# Patient Record
Sex: Female | Born: 1965 | Race: Black or African American | Hispanic: No | Marital: Single | Smoking: Current every day smoker
Health system: Southern US, Community
[De-identification: ages and names within clinical notes are randomized; demographics above are authoritative.]

## PROBLEM LIST (undated history)

## (undated) DIAGNOSIS — K5900 Constipation, unspecified: Secondary | ICD-10-CM

## (undated) DIAGNOSIS — M549 Dorsalgia, unspecified: Secondary | ICD-10-CM

## (undated) DIAGNOSIS — I10 Essential (primary) hypertension: Secondary | ICD-10-CM

## (undated) DIAGNOSIS — J45909 Unspecified asthma, uncomplicated: Secondary | ICD-10-CM

## (undated) HISTORY — DX: Dorsalgia, unspecified: M54.9

## (undated) HISTORY — PX: COLONOSCOPY: SHX174

## (undated) HISTORY — DX: Constipation, unspecified: K59.00

## (undated) HISTORY — DX: Unspecified asthma, uncomplicated: J45.909

## (undated) HISTORY — DX: Essential (primary) hypertension: I10

## (undated) HISTORY — PX: POLYPECTOMY: SHX149

---

## 1993-07-22 DIAGNOSIS — D259 Leiomyoma of uterus, unspecified: Secondary | ICD-10-CM | POA: Insufficient documentation

## 1998-02-07 ENCOUNTER — Emergency Department (HOSPITAL_COMMUNITY): Admission: EM | Admit: 1998-02-07 | Discharge: 1998-02-07 | Payer: Self-pay

## 1998-06-12 ENCOUNTER — Inpatient Hospital Stay (HOSPITAL_COMMUNITY): Admission: AD | Admit: 1998-06-12 | Discharge: 1998-06-12 | Payer: Self-pay | Admitting: Obstetrics & Gynecology

## 1998-07-25 ENCOUNTER — Inpatient Hospital Stay (HOSPITAL_COMMUNITY): Admission: AD | Admit: 1998-07-25 | Discharge: 1998-07-25 | Payer: Self-pay | Admitting: *Deleted

## 1998-11-02 ENCOUNTER — Emergency Department (HOSPITAL_COMMUNITY): Admission: EM | Admit: 1998-11-02 | Discharge: 1998-11-02 | Payer: Self-pay

## 1999-01-08 ENCOUNTER — Inpatient Hospital Stay (HOSPITAL_COMMUNITY): Admission: AD | Admit: 1999-01-08 | Discharge: 1999-01-08 | Payer: Self-pay | Admitting: Obstetrics

## 1999-04-13 ENCOUNTER — Emergency Department (HOSPITAL_COMMUNITY): Admission: EM | Admit: 1999-04-13 | Discharge: 1999-04-13 | Payer: Self-pay | Admitting: *Deleted

## 1999-09-06 ENCOUNTER — Emergency Department (HOSPITAL_COMMUNITY): Admission: EM | Admit: 1999-09-06 | Discharge: 1999-09-06 | Payer: Self-pay | Admitting: Emergency Medicine

## 2000-01-16 ENCOUNTER — Emergency Department (HOSPITAL_COMMUNITY): Admission: EM | Admit: 2000-01-16 | Discharge: 2000-01-16 | Payer: Self-pay | Admitting: Emergency Medicine

## 2000-02-14 ENCOUNTER — Encounter: Admission: RE | Admit: 2000-02-14 | Discharge: 2000-02-14 | Payer: Self-pay | Admitting: Obstetrics

## 2000-03-19 ENCOUNTER — Inpatient Hospital Stay (HOSPITAL_COMMUNITY): Admission: AD | Admit: 2000-03-19 | Discharge: 2000-03-19 | Payer: Self-pay | Admitting: Obstetrics

## 2000-03-20 ENCOUNTER — Ambulatory Visit (HOSPITAL_COMMUNITY): Admission: RE | Admit: 2000-03-20 | Discharge: 2000-03-20 | Payer: Self-pay | Admitting: *Deleted

## 2000-03-24 ENCOUNTER — Inpatient Hospital Stay (HOSPITAL_COMMUNITY): Admission: AD | Admit: 2000-03-24 | Discharge: 2000-03-24 | Payer: Self-pay | Admitting: *Deleted

## 2000-03-30 ENCOUNTER — Inpatient Hospital Stay (HOSPITAL_COMMUNITY): Admission: AD | Admit: 2000-03-30 | Discharge: 2000-03-30 | Payer: Self-pay | Admitting: Obstetrics & Gynecology

## 2000-04-08 ENCOUNTER — Inpatient Hospital Stay (HOSPITAL_COMMUNITY): Admission: AD | Admit: 2000-04-08 | Discharge: 2000-04-08 | Payer: Self-pay | Admitting: Obstetrics & Gynecology

## 2000-04-15 ENCOUNTER — Inpatient Hospital Stay (HOSPITAL_COMMUNITY): Admission: AD | Admit: 2000-04-15 | Discharge: 2000-04-15 | Payer: Self-pay | Admitting: *Deleted

## 2000-05-09 ENCOUNTER — Encounter: Payer: Self-pay | Admitting: Family Medicine

## 2000-05-09 ENCOUNTER — Emergency Department (HOSPITAL_COMMUNITY): Admission: EM | Admit: 2000-05-09 | Discharge: 2000-05-09 | Payer: Self-pay | Admitting: Emergency Medicine

## 2000-08-11 ENCOUNTER — Inpatient Hospital Stay (HOSPITAL_COMMUNITY): Admission: AD | Admit: 2000-08-11 | Discharge: 2000-08-11 | Payer: Self-pay | Admitting: *Deleted

## 2000-08-28 ENCOUNTER — Emergency Department (HOSPITAL_COMMUNITY): Admission: EM | Admit: 2000-08-28 | Discharge: 2000-08-28 | Payer: Self-pay | Admitting: Emergency Medicine

## 2000-10-09 ENCOUNTER — Encounter: Admission: RE | Admit: 2000-10-09 | Discharge: 2000-10-09 | Payer: Self-pay | Admitting: Obstetrics

## 2001-07-22 HISTORY — PX: DENTAL SURGERY: SHX609

## 2004-10-09 ENCOUNTER — Emergency Department (HOSPITAL_COMMUNITY): Admission: EM | Admit: 2004-10-09 | Discharge: 2004-10-09 | Payer: Self-pay | Admitting: Emergency Medicine

## 2004-10-11 ENCOUNTER — Ambulatory Visit: Payer: Self-pay | Admitting: Cardiovascular Disease

## 2004-10-11 ENCOUNTER — Ambulatory Visit: Payer: Self-pay

## 2005-05-07 ENCOUNTER — Emergency Department (HOSPITAL_COMMUNITY): Admission: EM | Admit: 2005-05-07 | Discharge: 2005-05-07 | Payer: Self-pay | Admitting: Emergency Medicine

## 2005-12-09 ENCOUNTER — Emergency Department (HOSPITAL_COMMUNITY): Admission: EM | Admit: 2005-12-09 | Discharge: 2005-12-09 | Payer: Self-pay | Admitting: Family Medicine

## 2007-09-09 ENCOUNTER — Encounter (INDEPENDENT_AMBULATORY_CARE_PROVIDER_SITE_OTHER): Payer: Self-pay | Admitting: Internal Medicine

## 2008-02-19 ENCOUNTER — Emergency Department (HOSPITAL_COMMUNITY): Admission: EM | Admit: 2008-02-19 | Discharge: 2008-02-19 | Payer: Self-pay | Admitting: Emergency Medicine

## 2008-10-20 ENCOUNTER — Ambulatory Visit: Payer: Self-pay | Admitting: Internal Medicine

## 2008-10-20 DIAGNOSIS — G5620 Lesion of ulnar nerve, unspecified upper limb: Secondary | ICD-10-CM | POA: Insufficient documentation

## 2008-10-20 DIAGNOSIS — G5600 Carpal tunnel syndrome, unspecified upper limb: Secondary | ICD-10-CM | POA: Insufficient documentation

## 2008-10-20 DIAGNOSIS — M76899 Other specified enthesopathies of unspecified lower limb, excluding foot: Secondary | ICD-10-CM | POA: Insufficient documentation

## 2008-10-20 DIAGNOSIS — R030 Elevated blood-pressure reading, without diagnosis of hypertension: Secondary | ICD-10-CM | POA: Insufficient documentation

## 2008-10-20 LAB — CONVERTED CEMR LAB
BUN: 5 mg/dL — ABNORMAL LOW (ref 6–23)
Chloride: 103 meq/L (ref 96–112)
Creatinine, Ser: 0.71 mg/dL (ref 0.40–1.20)
Preg, Serum: NEGATIVE

## 2008-10-24 ENCOUNTER — Ambulatory Visit: Payer: Self-pay | Admitting: Internal Medicine

## 2008-10-24 ENCOUNTER — Emergency Department (HOSPITAL_COMMUNITY): Admission: EM | Admit: 2008-10-24 | Discharge: 2008-10-24 | Payer: Self-pay | Admitting: Emergency Medicine

## 2008-10-27 ENCOUNTER — Encounter (INDEPENDENT_AMBULATORY_CARE_PROVIDER_SITE_OTHER): Payer: Self-pay | Admitting: Internal Medicine

## 2008-10-28 ENCOUNTER — Emergency Department (HOSPITAL_COMMUNITY): Admission: EM | Admit: 2008-10-28 | Discharge: 2008-10-28 | Payer: Self-pay | Admitting: Emergency Medicine

## 2008-10-31 ENCOUNTER — Ambulatory Visit: Payer: Self-pay | Admitting: *Deleted

## 2008-12-22 ENCOUNTER — Encounter (INDEPENDENT_AMBULATORY_CARE_PROVIDER_SITE_OTHER): Payer: Self-pay | Admitting: Internal Medicine

## 2009-01-16 ENCOUNTER — Ambulatory Visit: Payer: Self-pay | Admitting: Internal Medicine

## 2009-01-31 ENCOUNTER — Encounter (INDEPENDENT_AMBULATORY_CARE_PROVIDER_SITE_OTHER): Payer: Self-pay | Admitting: Internal Medicine

## 2009-01-31 ENCOUNTER — Ambulatory Visit: Payer: Self-pay | Admitting: Internal Medicine

## 2009-01-31 DIAGNOSIS — N959 Unspecified menopausal and perimenopausal disorder: Secondary | ICD-10-CM | POA: Insufficient documentation

## 2009-01-31 LAB — CONVERTED CEMR LAB
Bilirubin Urine: NEGATIVE
Chlamydia, DNA Probe: NEGATIVE
Glucose, Urine, Semiquant: NEGATIVE
KOH Prep: NEGATIVE
Protein, U semiquant: 100
Specific Gravity, Urine: 1.015
WBC Urine, dipstick: NEGATIVE
Whiff Test: NEGATIVE
pH: 7.5

## 2009-02-01 ENCOUNTER — Encounter (INDEPENDENT_AMBULATORY_CARE_PROVIDER_SITE_OTHER): Payer: Self-pay | Admitting: Internal Medicine

## 2009-02-08 ENCOUNTER — Ambulatory Visit (HOSPITAL_COMMUNITY): Admission: RE | Admit: 2009-02-08 | Discharge: 2009-02-08 | Payer: Self-pay | Admitting: Internal Medicine

## 2009-02-08 ENCOUNTER — Encounter (INDEPENDENT_AMBULATORY_CARE_PROVIDER_SITE_OTHER): Payer: Self-pay | Admitting: Internal Medicine

## 2009-02-24 ENCOUNTER — Ambulatory Visit: Payer: Self-pay | Admitting: Internal Medicine

## 2009-02-24 LAB — CONVERTED CEMR LAB
ALT: 16 units/L (ref 0–35)
Alkaline Phosphatase: 54 units/L (ref 39–117)
Basophils Absolute: 0 10*3/uL (ref 0.0–0.1)
Basophils Relative: 0 % (ref 0–1)
CO2: 22 meq/L (ref 19–32)
Cholesterol: 141 mg/dL (ref 0–200)
Creatinine, Ser: 0.79 mg/dL (ref 0.40–1.20)
Eosinophils Absolute: 0.1 10*3/uL (ref 0.0–0.7)
LDL Cholesterol: 69 mg/dL (ref 0–99)
MCHC: 31.7 g/dL (ref 30.0–36.0)
MCV: 93.3 fL (ref 78.0–100.0)
Neutro Abs: 3.9 10*3/uL (ref 1.7–7.7)
Neutrophils Relative %: 53 % (ref 43–77)
Platelets: 262 10*3/uL (ref 150–400)
RBC: 4.6 M/uL (ref 3.87–5.11)
RDW: 16.6 % — ABNORMAL HIGH (ref 11.5–15.5)
Sodium: 143 meq/L (ref 135–145)
Total Bilirubin: 0.4 mg/dL (ref 0.3–1.2)
Total CHOL/HDL Ratio: 3.3
Total Protein: 7.9 g/dL (ref 6.0–8.3)
VLDL: 29 mg/dL (ref 0–40)

## 2009-03-12 ENCOUNTER — Encounter (INDEPENDENT_AMBULATORY_CARE_PROVIDER_SITE_OTHER): Payer: Self-pay | Admitting: Internal Medicine

## 2009-05-01 ENCOUNTER — Encounter (INDEPENDENT_AMBULATORY_CARE_PROVIDER_SITE_OTHER): Payer: Self-pay | Admitting: *Deleted

## 2009-05-03 ENCOUNTER — Ambulatory Visit: Payer: Self-pay | Admitting: Internal Medicine

## 2009-05-03 LAB — CONVERTED CEMR LAB: Preg, Serum: NEGATIVE

## 2009-05-04 ENCOUNTER — Ambulatory Visit: Payer: Self-pay | Admitting: Internal Medicine

## 2009-05-16 ENCOUNTER — Ambulatory Visit: Payer: Self-pay | Admitting: Internal Medicine

## 2009-05-16 DIAGNOSIS — L0292 Furuncle, unspecified: Secondary | ICD-10-CM | POA: Insufficient documentation

## 2009-05-16 DIAGNOSIS — L0293 Carbuncle, unspecified: Secondary | ICD-10-CM

## 2009-07-27 ENCOUNTER — Ambulatory Visit: Payer: Self-pay | Admitting: Nurse Practitioner

## 2009-09-21 ENCOUNTER — Telehealth (INDEPENDENT_AMBULATORY_CARE_PROVIDER_SITE_OTHER): Payer: Self-pay | Admitting: *Deleted

## 2009-10-06 ENCOUNTER — Ambulatory Visit: Payer: Self-pay | Admitting: Internal Medicine

## 2009-10-06 DIAGNOSIS — H0019 Chalazion unspecified eye, unspecified eyelid: Secondary | ICD-10-CM | POA: Insufficient documentation

## 2009-10-18 ENCOUNTER — Ambulatory Visit: Payer: Self-pay | Admitting: Internal Medicine

## 2009-11-19 ENCOUNTER — Encounter (INDEPENDENT_AMBULATORY_CARE_PROVIDER_SITE_OTHER): Payer: Self-pay | Admitting: Internal Medicine

## 2010-01-09 ENCOUNTER — Ambulatory Visit: Payer: Self-pay | Admitting: Internal Medicine

## 2010-05-04 ENCOUNTER — Ambulatory Visit: Payer: Self-pay | Admitting: Nurse Practitioner

## 2010-05-10 ENCOUNTER — Telehealth (INDEPENDENT_AMBULATORY_CARE_PROVIDER_SITE_OTHER): Payer: Self-pay | Admitting: Internal Medicine

## 2010-05-11 ENCOUNTER — Ambulatory Visit: Payer: Self-pay | Admitting: Internal Medicine

## 2010-05-26 LAB — CONVERTED CEMR LAB: Preg, Serum: NEGATIVE

## 2010-07-18 ENCOUNTER — Telehealth (INDEPENDENT_AMBULATORY_CARE_PROVIDER_SITE_OTHER): Payer: Self-pay | Admitting: Internal Medicine

## 2010-07-22 DIAGNOSIS — I10 Essential (primary) hypertension: Secondary | ICD-10-CM

## 2010-07-22 HISTORY — DX: Essential (primary) hypertension: I10

## 2010-07-24 ENCOUNTER — Ambulatory Visit: Admit: 2010-07-24 | Payer: Self-pay | Admitting: Internal Medicine

## 2010-08-21 NOTE — Progress Notes (Signed)
Summary: next depo  Phone Note Call from Patient Call back at Home Phone [phone]   Reason for Call: Refill Medication Summary of Call: PT NEEDS TO COME AGAIN FOR HER DEPO PROVERA BUT SHE DOESN'T KNOW WHEN IS HER NEXT APPOINTMENT. PLEASE CALL HER BACK. MULBERRY MD Initial call taken by: Manon Hilding,  September 21, 2009 10:39 AM  Follow-up for Phone Call        Pt's next depo should be on March 30th. Follow-up by: Vesta Mixer CMA,  September 21, 2009 11:00 AM  Additional Follow-up for Phone Call Additional follow up Details #1::        Left a message in her voice mail.Manon Hilding  September 26, 2009 9:10 AM  I set up the appointment for the pt on March 30.Manon Hilding  October 03, 2009 4:42 PM

## 2010-08-21 NOTE — Miscellaneous (Signed)
Summary: bone density documentatilon  Clinical Lists Changes  Observations: Added new observation of BONE DENSITY: DEXA:  T score, AP Spine:  -0.9, T score neck, left femur:  -0.7, T score, neck right femur: -0.7   (02/08/2009 12:58)

## 2010-08-21 NOTE — Progress Notes (Signed)
  Phone Note Call from Patient   Summary of Call: PT NEEDS DEPO RX CALEED INTO [facility]  ASAP PT APPT IS 10.21.11 ALSO PT MAY NEED TO RESTART DEPO INJ AS SHE MISSED INJ ON 04/11/10 Initial call taken by: Michelle Nasuti,  May 10, 2010 4:34 PM  Follow-up for Phone Call        She has not had a CPP since 01/2009--her depo should not be refilled if she does not make a yearly appt.  Please make sure she has a CPP scheduled next available. Will need to come in for a serum HCG--lab visit--need to find out if she has had any unprotected sex since late on shot.   Unable to fax via emr--will need to manually fax tomorrow. Follow-up by: Julieanne Manson MD,  May 10, 2010 11:47 PM  Additional Follow-up for Phone Call Additional follow up Details #1::        pt in office today for lab work.  will schedule appt for Cpp.Also will schduled depo injection on Monday. Additional Follow-up by: Levon Hedger,  May 11, 2010 2:42 PM    Prescriptions: DEPO-PROVERA 150 MG/ML SUSP (MEDROXYPROGESTERONE ACETATE) 150 mg IM every 3 months  #1 x 0   Entered and Authorized by:   Julieanne Manson MD   Signed by:   Julieanne Manson MD on 05/10/2010   Method used:   Printed then faxed to ...       [facility] (retail)       [address] [place], [place]  [address]       Ph: [phone]       Fax: [phone]   RxID:   [id number]

## 2010-08-21 NOTE — Assessment & Plan Note (Signed)
Summary: BUMP LEFT EYE/ UNDER EYE LIDS//GK   Vital Signs:  Patient profile:   45 year old female Weight:      198 pounds BMI:     35.77 Temp:     98.3 degrees F Pulse rate:   72 / minute Pulse rhythm:   regular Resp:     18 per minute BP sitting:   128 / 86  (left arm) Cuff size:   large  Vitals Entered By: Vesta Mixer CMA (October 06, 2009 10:24 AM) CC: bump inside left lower eye lid for about 3 weeks it did itch at first, but no so much now and is a little smaller also. Is Patient Diabetic? No Pain Assessment Patient in pain? no       Does patient need assistance? Ambulation Normal   CC:  bump inside left lower eye lid for about 3 weeks it did itch at first and but no so much now and is a little smaller also.Marland Kitchen  History of Present Illness: 1.  Left lower eyelid lesion--as above.  Cannot recall if any foreign body in eye before itching and bump noted.  At one time had clear discharge from the lesion.  Has not noted any problem with skin side of lower lid--just the conunctival surface.  Vision occasionally gets hazy on that side--clears with blinking eye.  Has become smaller with time as above.  Allergies (verified): No Known Drug Allergies  Physical Exam  Eyes:  5mm indurated cyst - like lesion in nasal aspect of left lower lid--can only see on conjunctival aspect of lid--does have a 1 mm pustular head.  No inflammatory change of outer lid.  PERRL, EOMI   Impression & Recommendations:  Problem # 1:  CHALAZION, LEFT (ICD-373.2) Because of inflammation, will treat with antibiotics and have pt. follow up Discussed may have chronic cyst there.  Complete Medication List: 1)  Ibuprofen 600 Mg Tabs (Ibuprofen) .Marland Kitchen.. 1 tab by mouth at bedtime as needed leg pain 2)  Depo-provera 150 Mg/ml Susp (Medroxyprogesterone acetate) .Marland KitchenMarland KitchenMarland Kitchen 150 mg im every 3 months 3)  Doxycycline Hyclate 100 Mg Tabs (Doxycycline hyclate) .Marland Kitchen.. 1 tab by mouth two times a day for 7 days  Patient  Instructions: 1)  Follow up with Dr. Delrae Alfred in 1 month  2)  Warm pack eye 1/2 hour after taking antibiotic two times a day  Prescriptions: DOXYCYCLINE HYCLATE 100 MG TABS (DOXYCYCLINE HYCLATE) 1 tab by mouth two times a day for 7 days  #14 x 0   Entered and Authorized by:   Julieanne Manson MD   Signed by:   Julieanne Manson MD on 10/06/2009   Method used:   Electronically to        [facility] [id number]* (retail)       [address]       [place], [place]  [address]       Ph: [phone]       Fax: [phone]   RxID:   [id number]

## 2010-08-23 NOTE — Progress Notes (Signed)
Summary: Query:  Refill Depo-Provera?  Phone Note Refill Request   Refills Requested: Medication #1:  DEPO-PROVERA 150 MG/ML SUSP 150 mg IM every 3 months NEED REFILL HEALTH SERVE  Initial call taken by: Domenic Polite,  July 18, 2010 10:32 AM  Follow-up for Phone Call        Refill Depo-Provera?  Last seen 09/2009.   Follow-up by: Dutch Quint RN,  July 18, 2010 11:02 AM  Additional Follow-up for Phone Call Additional follow up Details #1::        No--she has not come in for a yearly Pap since 01/2009. Needs to get one scheduled before will refill. Please discuss with pt. that it is her responsibility to call in for a yearly pap to continue the Depo.   She actually should not have been receiving Depo for past 6 months as she did not make or keep a CPP one year from last Additional Follow-up by: Julieanne Manson MD,  July 18, 2010 1:38 PM    Additional Follow-up for Phone Call Additional follow up Details #2::    Noted.  Left message with female for pt. to return call.  Dutch Quint RN  July 18, 2010 4:26 PM  Pt. advised of provider's response - verbalized understanding and agreement.  Reminded to use other methods of BC to prevent pregnancy.  Depo appt. cancelled for 07/24/10, appt. for CPP scheduled for 10/11/10.  Dutch Quint RN  July 19, 2010 5:44 PM

## 2010-10-31 LAB — CULTURE, ROUTINE-ABSCESS

## 2010-12-07 NOTE — Consult Note (Signed)
NAMEVELMER, BROADFOOT NO.:  [id number]   MEDICAL RECORD NO.:  [id number]          PATIENT TYPE:  EMS   LOCATION:  [facility]                         FACILITY:  [facility]   PHYSICIAN:  Charlton Haws, M.D.     DATE OF BIRTH:  12-04-1965   DATE OF CONSULTATION:  10/09/2004  DATE OF DISCHARGE:                                   CONSULTATION   REASON FOR CONSULTATION:  Ms. Darrow is a 45 year old patient who is not seen  regularly by a physician.  She has had 2 months of intermittent chest pain.  The pain is somewhat atypical for angina and is not truly exertional.  She  occasionally gets a cough.  She has not had a fever.  She smokes 1/2 pack a  day for over 20 years.  There is no pleuritic component.   The pain was somewhat worse this morning and she came to the ER.  Her  initial EKG was abnormal, but there was no baseline.  She had biphasic T  waves in the anterolateral leads.  Point of care enzymes are negative.   The patient has not tried nitroglycerin for the pain.  The patient's risk  factors otherwise include positive family history.  She does not take any  routine medications.   ALLERGIES:  No known drug allergies.   PAST MEDICAL HISTORY:  She has had some uterine fibroids, but no other major  medical problems.   SOCIAL HISTORY:  She is estranged from her husband.  She does not have kids.  She has brothers and sisters in the area.  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  She is otherwise fairly sedentary.  She denies other drug or alcohol use.   PHYSICAL EXAMINATION:  VITAL SIGNS:  Blood pressure 140/80, pulse 75 and  regular.  LUNGS:  Clear.  CARDIAC:  Normal heart sounds.  ABDOMEN:  Benign.  EXTREMITIES:  No edema.   LABORATORY DATA AND X-RAY FINDINGS:  EKG as described.  Chest x-ray is  normal.   Point of care enzymes are negative.   RECOMMENDATIONS:  I think it is fine for the patient to be discharged from  the ER.  We will have her follow up in our office in  the next 1-2 days to  have a stress Cardiolite and echocardiogram.  The echocardiogram will help  rule out any type of hypertrophic cardiomyopathy given her abnormal EKG.  Her pain is atypical and I suspect this is her baseline EKG.   Will give her prescription for nitroglycerin in case she gets severe pain at  home and also have her take an aspirin a day.      PN/MEDQ  D:  10/09/2004  T:  10/09/2004  Job:  [id number]

## 2011-04-19 LAB — URINE CULTURE: Colony Count: >=100000

## 2011-04-19 LAB — POCT PREGNANCY, URINE: Preg Test, Ur: NEGATIVE

## 2011-04-19 LAB — POCT URINALYSIS DIP (DEVICE)
Nitrite: NEGATIVE
Operator id: 235561
Protein, ur: 30 — AB
pH: 5.5

## 2011-04-19 LAB — GC/CHLAMYDIA PROBE AMP, GENITAL: GC Probe Amp, Genital: NEGATIVE

## 2011-06-19 ENCOUNTER — Emergency Department (INDEPENDENT_AMBULATORY_CARE_PROVIDER_SITE_OTHER)
Admission: EM | Admit: 2011-06-19 | Discharge: 2011-06-19 | Disposition: A | Discharge status: Home / Self Care | Payer: BC Managed Care – PPO | Source: Home / Self Care | Attending: Emergency Medicine | Admitting: Emergency Medicine

## 2011-06-19 DIAGNOSIS — J40 Bronchitis, not specified as acute or chronic: Secondary | ICD-10-CM

## 2011-06-19 MED ORDER — AZITHROMYCIN 250 MG PO TABS: ORAL_TABLET | ORAL | Status: AC

## 2011-06-19 MED ORDER — ALBUTEROL SULFATE HFA 108 (90 BASE) MCG/ACT IN AERS
1.0000 | INHALATION_SPRAY | Freq: Four times a day (QID) | RESPIRATORY_TRACT | Status: DC | PRN
Start: 2011-06-19 — End: 2014-11-29

## 2011-06-19 NOTE — ED Notes (Signed)
Pt c/o cough, SOB, head and nasal congestion, rt ear pain and wheezing.  Sx since 05-10-11.  States she has been taking robitussin DM

## 2011-06-19 NOTE — ED Provider Notes (Addendum)
History     CSN: [id number] Arrival date & time: 06/19/2011  5:23 PM   First MD Initiated Contact with Patient 06/19/11 1719      Chief Complaint  Patient presents with  . Cough  . Shortness of Breath    (Consider location/radiation/quality/duration/timing/severity/associated sxs/prior treatment) HPI Comments: Ben congested and with sinus congestion and drainage" coughing more so at night and at times my chest feels tight and can't breath well"..  Patient is a 45 y.o. female presenting with cough.  Cough This is a new problem. The current episode started more than 1 week ago. The problem occurs every few minutes. The problem has not changed since onset.The cough is productive of sputum. There has been no fever. Associated symptoms include headaches, sore throat and shortness of breath. Pertinent negatives include no weight loss and no wheezing. She has tried nothing for the symptoms. She is not a smoker. Her past medical history is significant for bronchitis. Her past medical history does not include pneumonia, emphysema or asthma.    History reviewed. No pertinent past medical history.  History reviewed. No pertinent past surgical history.  No family history on file.  History  Substance Use Topics  . Smoking status: Current Everyday Smoker -- 0.5 packs/day  . Smokeless tobacco: Not on file  . Alcohol Use: Yes     occasional    OB History    Grav Para Term Preterm Abortions TAB SAB Ect Mult Living                  Review of Systems  Constitutional: Negative for weight loss.  HENT: Positive for sore throat.   Respiratory: Positive for cough and shortness of breath. Negative for wheezing.   Neurological: Positive for headaches.    Allergies  Review of patient's allergies indicates no known allergies.  Home Medications   Current Outpatient Rx  Name Route Sig Dispense Refill  . ROBITUSSIN DM PO Oral Take by mouth as needed.      . ALBUTEROL SULFATE HFA 108 (90  BASE) MCG/ACT IN AERS Inhalation Inhale 1-2 puffs into the lungs every 6 (six) hours as needed for wheezing. 1 Inhaler 0  . AZITHROMYCIN 250 MG PO TABS  Take two tablets today and starting tomorrow one tablet daily x 4 days 6 each 0    BP 167/98  Pulse 89  Temp(Src) 99.1 F (37.3 C) (Oral)  Resp 18  SpO2 95%  Physical Exam  Nursing note and vitals reviewed. Constitutional: She appears well-developed and well-nourished.  HENT:  Head: Normocephalic.  Right Ear: Tympanic membrane normal.  Left Ear: Tympanic membrane normal.  Mouth/Throat: Uvula is midline, oropharynx is clear and moist and mucous membranes are normal.  Eyes: Pupils are equal, round, and reactive to light. Right eye exhibits no discharge. Left eye exhibits no discharge.  Neck: Normal range of motion.  Cardiovascular: Normal rate.   Pulmonary/Chest: Effort normal and breath sounds normal. No respiratory distress. She has no wheezes. She has no rhonchi. She has no rales. She exhibits no tenderness.  Abdominal: Soft.  Lymphadenopathy:    She has no cervical adenopathy.    ED Course  Procedures (including critical care time)  Labs Reviewed - No data to display No results found.   1. Bronchitis       MDM   COUGH AFEBRILE X 2 WEEKS- NORMAL EXAM       Jimmie Molly, MD 06/19/11 1810  Jimmie Molly, MD 06/19/11 [phone]

## 2012-07-23 ENCOUNTER — Encounter: Payer: Self-pay | Admitting: Family Medicine

## 2012-07-23 ENCOUNTER — Ambulatory Visit: Payer: BC Managed Care – PPO | Admitting: Family Medicine

## 2013-09-24 ENCOUNTER — Ambulatory Visit (HOSPITAL_COMMUNITY)
Admission: RE | Admit: 2013-09-24 | Discharge: 2013-09-24 | Disposition: A | Discharge status: Home / Self Care | Payer: BC Managed Care – PPO | Source: Ambulatory Visit | Attending: Family Medicine | Admitting: Family Medicine

## 2013-09-24 ENCOUNTER — Other Ambulatory Visit (HOSPITAL_COMMUNITY): Payer: Self-pay | Admitting: Family Medicine

## 2013-09-24 DIAGNOSIS — R0609 Other forms of dyspnea: Secondary | ICD-10-CM | POA: Insufficient documentation

## 2013-09-24 DIAGNOSIS — R0989 Other specified symptoms and signs involving the circulatory and respiratory systems: Principal | ICD-10-CM | POA: Insufficient documentation

## 2013-09-24 DIAGNOSIS — R0600 Dyspnea, unspecified: Secondary | ICD-10-CM

## 2013-09-24 DIAGNOSIS — R52 Pain, unspecified: Secondary | ICD-10-CM

## 2014-11-29 ENCOUNTER — Encounter (HOSPITAL_COMMUNITY): Payer: Self-pay | Admitting: Emergency Medicine

## 2014-11-29 ENCOUNTER — Emergency Department (INDEPENDENT_AMBULATORY_CARE_PROVIDER_SITE_OTHER)
Admission: EM | Admit: 2014-11-29 | Discharge: 2014-11-29 | Disposition: A | Discharge status: Home / Self Care | Payer: BC Managed Care – PPO | Source: Home / Self Care | Attending: Family Medicine | Admitting: Family Medicine

## 2014-11-29 DIAGNOSIS — Z720 Tobacco use: Secondary | ICD-10-CM | POA: Diagnosis not present

## 2014-11-29 DIAGNOSIS — J9801 Acute bronchospasm: Secondary | ICD-10-CM

## 2014-11-29 DIAGNOSIS — J302 Other seasonal allergic rhinitis: Secondary | ICD-10-CM

## 2014-11-29 MED ORDER — ALBUTEROL SULFATE HFA 108 (90 BASE) MCG/ACT IN AERS: 2.0000 | INHALATION_SPRAY | RESPIRATORY_TRACT | Status: DC | PRN

## 2014-11-29 MED ORDER — TRIAMCINOLONE ACETONIDE 40 MG/ML IJ SUSP
INTRAMUSCULAR | Status: AC
  Filled 2014-11-29: qty 1

## 2014-11-29 MED ORDER — IPRATROPIUM-ALBUTEROL 0.5-2.5 (3) MG/3ML IN SOLN
RESPIRATORY_TRACT | Status: AC
  Filled 2014-11-29: qty 3

## 2014-11-29 MED ORDER — TRIAMCINOLONE ACETONIDE 40 MG/ML IJ SUSP
40.0000 mg | Freq: Once | INTRAMUSCULAR | Status: AC
  Administered 2014-11-29: 40 mg via INTRAMUSCULAR

## 2014-11-29 MED ORDER — ALBUTEROL SULFATE (2.5 MG/3ML) 0.083% IN NEBU
2.5000 mg | INHALATION_SOLUTION | Freq: Once | RESPIRATORY_TRACT | Status: AC
  Administered 2014-11-29: 2.5 mg via RESPIRATORY_TRACT

## 2014-11-29 MED ORDER — IPRATROPIUM BROMIDE 0.06 % NA SOLN: 2.0000 | Freq: Four times a day (QID) | NASAL | Status: DC

## 2014-11-29 MED ORDER — ALBUTEROL SULFATE (2.5 MG/3ML) 0.083% IN NEBU
INHALATION_SOLUTION | RESPIRATORY_TRACT | Status: AC
  Filled 2014-11-29: qty 3

## 2014-11-29 MED ORDER — PREDNISONE 20 MG PO TABS: ORAL_TABLET | ORAL | Status: DC

## 2014-11-29 MED ORDER — IPRATROPIUM-ALBUTEROL 0.5-2.5 (3) MG/3ML IN SOLN
3.0000 mL | Freq: Once | RESPIRATORY_TRACT | Status: AC
  Administered 2014-11-29: 3 mL via RESPIRATORY_TRACT

## 2014-11-29 NOTE — Discharge Instructions (Signed)
Allergic Rhinitis Flonase Nasal spray Sudafed PE for congestion Allegra or Zyrtec for drainage Allergic rhinitis is when the mucous membranes in the nose respond to allergens. Allergens are particles in the air that cause your body to have an allergic reaction. This causes you to release allergic antibodies. Through a chain of events, these eventually cause you to release histamine into the blood stream. Although meant to protect the body, it is this release of histamine that causes your discomfort, such as frequent sneezing, congestion, and an itchy, runny nose.  CAUSES  Seasonal allergic rhinitis (hay fever) is caused by pollen allergens that may come from grasses, trees, and weeds. Year-round allergic rhinitis (perennial allergic rhinitis) is caused by allergens such as house dust mites, pet dander, and mold spores.  SYMPTOMS   Nasal stuffiness (congestion).  Itchy, runny nose with sneezing and tearing of the eyes. DIAGNOSIS  Your health care provider can help you determine the allergen or allergens that trigger your symptoms. If you and your health care provider are unable to determine the allergen, skin or blood testing may be used. TREATMENT  Allergic rhinitis does not have a cure, but it can be controlled by:  Medicines and allergy shots (immunotherapy).  Avoiding the allergen. Hay fever may often be treated with antihistamines in pill or nasal spray forms. Antihistamines block the effects of histamine. There are over-the-counter medicines that may help with nasal congestion and swelling around the eyes. Check with your health care provider before taking or giving this medicine.  If avoiding the allergen or the medicine prescribed do not work, there are many new medicines your health care provider can prescribe. Stronger medicine may be used if initial measures are ineffective. Desensitizing injections can be used if medicine and avoidance does not work. Desensitization is when a patient  is given ongoing shots until the body becomes less sensitive to the allergen. Make sure you follow up with your health care provider if problems continue. HOME CARE INSTRUCTIONS It is not possible to completely avoid allergens, but you can reduce your symptoms by taking steps to limit your exposure to them. It helps to know exactly what you are allergic to so that you can avoid your specific triggers. SEEK MEDICAL CARE IF:   You have a fever.  You develop a cough that does not stop easily (persistent).  You have shortness of breath.  You start wheezing.  Symptoms interfere with normal daily activities. Document Released: 04/02/2001 Document Revised: 07/13/2013 Document Reviewed: 03/15/2013 [facility] Patient Information 2015 [place], [place]. This information is not intended to replace advice given to you by your health care provider. Make sure you discuss any questions you have with your health care provider.  Bronchospasm A bronchospasm is a spasm or tightening of the airways going into the lungs. During a bronchospasm breathing becomes more difficult because the airways get smaller. When this happens there can be coughing, a whistling sound when breathing (wheezing), and difficulty breathing. Bronchospasm is often associated with asthma, but not all patients who experience a bronchospasm have asthma. CAUSES  A bronchospasm is caused by inflammation or irritation of the airways. The inflammation or irritation may be triggered by:   Allergies (such as to animals, pollen, food, or mold). Allergens that cause bronchospasm may cause wheezing immediately after exposure or many hours later.   Infection. Viral infections are believed to be the most common cause of bronchospasm.   Exercise.   Irritants (such as pollution, cigarette smoke, strong odors, aerosol sprays, and  paint fumes).   Weather changes. Winds increase molds and pollens in the air. Rain refreshes the air by washing irritants  out. Cold air may cause inflammation.   Stress and emotional upset.  SIGNS AND SYMPTOMS   Wheezing.   Excessive nighttime coughing.   Frequent or severe coughing with a simple cold.   Chest tightness.   Shortness of breath.  DIAGNOSIS  Bronchospasm is usually diagnosed through a history and physical exam. Tests, such as chest X-rays, are sometimes done to look for other conditions. TREATMENT   Inhaled medicines can be given to open up your airways and help you breathe. The medicines can be given using either an inhaler or a nebulizer machine.  Corticosteroid medicines may be given for severe bronchospasm, usually when it is associated with asthma. HOME CARE INSTRUCTIONS   Always have a plan prepared for seeking medical care. Know when to call your health care provider and local emergency services (911 in the [place] Know where you can access local emergency care.  Only take medicines as directed by your health care provider.  If you were prescribed an inhaler or nebulizer machine, ask your health care provider to explain how to use it correctly. Always use a spacer with your inhaler if you were given one.  It is necessary to remain calm during an attack. Try to relax and breathe more slowly.  Control your home environment in the following ways:   Change your heating and air conditioning filter at least once a month.   Limit your use of fireplaces and wood stoves.  Do not smoke and do not allow smoking in your home.   Avoid exposure to perfumes and fragrances.   Get rid of pests (such as roaches and mice) and their droppings.   Throw away plants if you see mold on them.   Keep your house clean and dust free.   Replace carpet with wood, tile, or vinyl flooring. Carpet can trap dander and dust.   Use allergy-proof pillows, mattress covers, and box spring covers.   Wash bed sheets and blankets every week in hot water and dry them in a dryer.   Use  blankets that are made of polyester or cotton.   Wash hands frequently. SEEK MEDICAL CARE IF:   You have muscle aches.   You have chest pain.   The sputum changes from clear or white to yellow, green, gray, or bloody.   The sputum you cough up gets thicker.   There are problems that may be related to the medicine you are given, such as a rash, itching, swelling, or trouble breathing.  SEEK IMMEDIATE MEDICAL CARE IF:   You have worsening wheezing and coughing even after taking your prescribed medicines.   You have increased difficulty breathing.   You develop severe chest pain. MAKE SURE YOU:   Understand these instructions.  Will watch your condition.  Will get help right away if you are not doing well or get worse. Document Released: 07/11/2003 Document Revised: 07/13/2013 Document Reviewed: 12/28/2012 [facility] Patient Information 2015 [place], [place]. This information is not intended to replace advice given to you by your health care provider. Make sure you discuss any questions you have with your health care provider.  How to Use an Inhaler Using your inhaler correctly is very important. Good technique will make sure that the medicine reaches your lungs.  HOW TO USE AN INHALER:  Take the cap off the inhaler.  If this is the first time  using your inhaler, you need to prime it. Shake the inhaler for 5 seconds. Release four puffs into the air, away from your face. Ask your doctor for help if you have questions.  Shake the inhaler for 5 seconds.  Turn the inhaler so the bottle is above the mouthpiece.  Put your pointer finger on top of the bottle. Your thumb holds the bottom of the inhaler.  Open your mouth.  Either hold the inhaler away from your mouth (the width of 2 fingers) or place your lips tightly around the mouthpiece. Ask your doctor which way to use your inhaler.  Breathe out as much air as possible.  Breathe in and push down on the bottle 1 time to  release the medicine. You will feel the medicine go in your mouth and throat.  Continue to take a deep breath in very slowly. Try to fill your lungs.  After you have breathed in completely, hold your breath for 10 seconds. This will help the medicine to settle in your lungs. If you cannot hold your breath for 10 seconds, hold it for as long as you can before you breathe out.  Breathe out slowly, through pursed lips. Whistling is an example of pursed lips.  If your doctor has told you to take more than 1 puff, wait at least 15-30 seconds between puffs. This will help you get the best results from your medicine. Do not use the inhaler more than your doctor tells you to.  Put the cap back on the inhaler.  Follow the directions from your doctor or from the inhaler package about cleaning the inhaler. If you use more than one inhaler, ask your doctor which inhalers to use and what order to use them in. Ask your doctor to help you figure out when you will need to refill your inhaler.  If you use a steroid inhaler, always rinse your mouth with water after your last puff, gargle and spit out the water. Do not swallow the water. GET HELP IF:  The inhaler medicine only partially helps to stop wheezing or shortness of breath.  You are having trouble using your inhaler.  You have some increase in thick spit (phlegm). GET HELP RIGHT AWAY IF:  The inhaler medicine does not help your wheezing or shortness of breath or you have tightness in your chest.  You have dizziness, headaches, or fast heart rate.  You have chills, fever, or night sweats.  You have a large increase of thick spit, or your thick spit is bloody. MAKE SURE YOU:   Understand these instructions.  Will watch your condition.  Will get help right away if you are not doing well or get worse. Document Released: 04/16/2008 Document Revised: 04/28/2013 Document Reviewed: 02/04/2013 [facility] Patient Information 2015 [place], [place].  This information is not intended to replace advice given to you by your health care provider. Make sure you discuss any questions you have with your health care provider.  Smoking Hazards Smoking cigarettes is extremely bad for your health. Tobacco smoke has over 200 known poisons in it. It contains the poisonous gases nitrogen oxide and carbon monoxide. There are over 60 chemicals in tobacco smoke that cause cancer. Some of the chemicals found in cigarette smoke include:   Cyanide.   Benzene.   Formaldehyde.   Methanol (wood alcohol).   Acetylene (fuel used in welding torches).   Ammonia.  Even smoking lightly shortens your life expectancy by several years. You can greatly reduce the risk  of medical problems for you and your family by stopping now. Smoking is the most preventable cause of death and disease in our society. Within days of quitting smoking, your circulation improves, you decrease the risk of having a heart attack, and your lung capacity improves. There may be some increased phlegm in the first few days after quitting, and it may take months for your lungs to clear up completely. Quitting for 10 years reduces your risk of developing lung cancer to almost that of a nonsmoker.  WHAT ARE THE RISKS OF SMOKING? Cigarette smokers have an increased risk of many serious medical problems, including:  Lung cancer.   Lung disease (such as pneumonia, bronchitis, and emphysema).   Heart attack and chest pain due to the heart not getting enough oxygen (angina).   Heart disease and peripheral blood vessel disease.   Hypertension.   Stroke.   Oral cancer (cancer of the lip, mouth, or voice box).   Bladder cancer.   Pancreatic cancer.   Cervical cancer.   Pregnancy complications, including premature birth.   Stillbirths and smaller newborn babies, birth defects, and genetic damage to sperm.   Early menopause.   Lower estrogen level for women.    Infertility.   Facial wrinkles.   Blindness.   Increased risk of broken bones (fractures).   Senile dementia.   Stomach ulcers and internal bleeding.   Delayed wound healing and increased risk of complications during surgery. Because of secondhand smoke exposure, children of smokers have an increased risk of the following:   Sudden infant death syndrome (SIDS).   Respiratory infections.   Lung cancer.   Heart disease.   Ear infections.  WHY IS SMOKING ADDICTIVE? Nicotine is the chemical agent in tobacco that is capable of causing addiction or dependence. When you smoke and inhale, nicotine is absorbed rapidly into the bloodstream through your lungs. Both inhaled and noninhaled nicotine may be addictive.  WHAT ARE THE BENEFITS OF QUITTING?  There are many health benefits to quitting smoking. Some are:   The likelihood of developing cancer and heart disease decreases. Health improvements are seen almost immediately.   Blood pressure, pulse rate, and breathing patterns start returning to normal soon after quitting.   People who quit may see an improvement in their overall quality of life.  HOW DO YOU QUIT SMOKING? Smoking is an addiction with both physical and psychological effects, and longtime habits can be hard to change. Your health care provider can recommend:  Programs and community resources, which may include group support, education, or therapy.  Replacement products, such as patches, gum, and nasal sprays. Use these products only as directed. Do not replace cigarette smoking with electronic cigarettes (commonly called e-cigarettes). The safety of e-cigarettes is unknown, and some may contain harmful chemicals. FOR MORE INFORMATION  American Lung Association: www.lung.org  American Cancer Society: www.cancer.org Document Released: 08/15/2004 Document Revised: 04/28/2013 Document Reviewed: 12/28/2012 [facility] Patient Information 2015 [place],  [place]. This information is not intended to replace advice given to you by your health care provider. Make sure you discuss any questions you have with your health care provider.

## 2014-11-29 NOTE — ED Provider Notes (Signed)
CSN: [id number]     Arrival date & time 11/29/14  1248 History   First MD Initiated Contact with Patient 11/29/14 1407     Chief Complaint  Patient presents with  . URI   (Consider location/radiation/quality/duration/timing/severity/associated sxs/prior Treatment) HPI Comments: 49 year old female complaining of persistent cough, nasal congestion, PND, shortness of breath, wheezing. This began approximately one month ago. She has been taking various medications including tests and MDM and a cough and cold medicine over-the-counter. She has a history of smoking for several years and states she smokes less than one pack per day now.   Past Medical History  Diagnosis Date  . HTN (hypertension) 2012   History reviewed. No pertinent past surgical history. Family History  Problem Relation Age of Onset  . Cancer Father   . Heart failure Mother   . Diabetes Mother   . Hypertension Mother   . Kidney disease Mother   . Hypertension Father    History  Substance Use Topics  . Smoking status: Current Every Day Smoker -- 0.50 packs/day  . Smokeless tobacco: Not on file  . Alcohol Use: Yes     Comment: occasional   OB History    No data available     Review of Systems  Constitutional: Positive for activity change. Negative for fever, chills, appetite change and fatigue.  HENT: Positive for congestion, postnasal drip, rhinorrhea and sinus pressure. Negative for ear pain, facial swelling, sore throat and trouble swallowing.   Eyes: Negative.   Respiratory: Positive for cough, shortness of breath and wheezing.   Cardiovascular: Negative.   Gastrointestinal: Negative.   Musculoskeletal: Negative.  Negative for neck pain and neck stiffness.  Skin: Negative for pallor and rash.  Neurological: Negative.     Allergies  Review of patient's allergies indicates no known allergies.  Home Medications   Prior to Admission medications   Medication Sig Start Date End Date Taking? Authorizing  Provider  albuterol (PROVENTIL HFA;VENTOLIN HFA) 108 (90 BASE) MCG/ACT inhaler Inhale 2 puffs into the lungs every 4 (four) hours as needed for wheezing or shortness of breath. 11/29/14   Janne Napoleon, NP  Dextromethorphan-Guaifenesin (ROBITUSSIN DM PO) Take by mouth as needed.      Historical Provider, MD  ipratropium (ATROVENT) 0.06 % nasal spray Place 2 sprays into both nostrils 4 (four) times daily. 11/29/14   Janne Napoleon, NP  predniSONE (DELTASONE) 20 MG tablet Take 3 tabs po on first day, 2 tabs second day, 2 tabs third day, 1 tab fourth day, 1 tab 5th day. Take with food. 11/29/14   Janne Napoleon, NP   BP 134/94 mmHg  Pulse 90  Temp(Src) 98.8 F (37.1 C) (Oral)  Resp 16  SpO2 96% Physical Exam  Constitutional: She is oriented to person, place, and time. She appears well-developed and well-nourished. No distress.  HENT:  Mouth/Throat: No oropharyngeal exudate.  Bilateral TMs are normal Oropharynx minor erythema with moderate amount of clear PND. Surface of the tongue is discolored due to smoking.  Eyes: EOM are normal.  Minor bilateral conjunctival erythema.  Neck: Normal range of motion. Neck supple.  Cardiovascular: Normal rate, regular rhythm, normal heart sounds and intact distal pulses.   Pulmonary/Chest: Effort normal. She has wheezes. She has no rales.  Prolonged expiratory phase  Musculoskeletal: Normal range of motion.  Lymphadenopathy:    She has no cervical adenopathy.  Neurological: She is alert and oriented to person, place, and time. She exhibits normal muscle tone.  Skin: Skin is warm  and dry.  Nursing note and vitals reviewed.   ED Course  Procedures (including critical care time) Labs Review Labs Reviewed - No data to display  Imaging Review No results found.   MDM   1. Other seasonal allergic rhinitis   2. Bronchospasm   3. Tobacco abuse disorder    Post DuoNeb 5 mg/2.5 mg patient states she is breathing better. Lung auscultation reveals much improved air  movement, significant decrease in wheezing and improved expiratory phase shortening. Kenalog 40 mg IM Flonase nasal spray as directed Atrovent nasal spray Prednisone taper dose Tylenol as needed Follow-up with your PCP. [place]    Janne Napoleon, NP 11/29/14 1510

## 2014-11-29 NOTE — ED Notes (Signed)
Patient c/o chest congestion and nasal congestion with SOB x 1 month. Denies fever but has had chills. Patient is in NAD.

## 2015-08-20 ENCOUNTER — Emergency Department (HOSPITAL_COMMUNITY)
Admission: EM | Admit: 2015-08-20 | Discharge: 2015-08-20 | Disposition: A | Discharge status: Home / Self Care | Payer: BC Managed Care – PPO | Attending: Emergency Medicine | Admitting: Emergency Medicine

## 2015-08-20 ENCOUNTER — Emergency Department (HOSPITAL_COMMUNITY): Payer: BC Managed Care – PPO

## 2015-08-20 ENCOUNTER — Encounter (HOSPITAL_COMMUNITY): Payer: Self-pay | Admitting: Emergency Medicine

## 2015-08-20 DIAGNOSIS — E860 Dehydration: Secondary | ICD-10-CM | POA: Diagnosis not present

## 2015-08-20 DIAGNOSIS — F10120 Alcohol abuse with intoxication, uncomplicated: Secondary | ICD-10-CM | POA: Diagnosis not present

## 2015-08-20 DIAGNOSIS — R42 Dizziness and giddiness: Secondary | ICD-10-CM | POA: Diagnosis present

## 2015-08-20 DIAGNOSIS — F17200 Nicotine dependence, unspecified, uncomplicated: Secondary | ICD-10-CM | POA: Insufficient documentation

## 2015-08-20 DIAGNOSIS — I10 Essential (primary) hypertension: Secondary | ICD-10-CM | POA: Insufficient documentation

## 2015-08-20 DIAGNOSIS — F10920 Alcohol use, unspecified with intoxication, uncomplicated: Secondary | ICD-10-CM

## 2015-08-20 LAB — CBC WITH DIFFERENTIAL/PLATELET
BASOS ABS: 0 10*3/uL (ref 0.0–0.1)
BASOS PCT: 0 %
EOS ABS: 0.1 10*3/uL (ref 0.0–0.7)
Eosinophils Relative: 1 %
HCT: 38.9 % (ref 36.0–46.0)
HEMOGLOBIN: 12.4 g/dL (ref 12.0–15.0)
Lymphocytes Relative: 37 %
Lymphs Abs: 2.5 10*3/uL (ref 0.7–4.0)
MCH: 26.6 pg (ref 26.0–34.0)
MCHC: 31.9 g/dL (ref 30.0–36.0)
MCV: 83.5 fL (ref 78.0–100.0)
MONOS PCT: 6 %
Monocytes Absolute: 0.4 10*3/uL (ref 0.1–1.0)
NEUTROS PCT: 56 %
Neutro Abs: 3.8 10*3/uL (ref 1.7–7.7)
Platelets: 258 10*3/uL (ref 150–400)
RBC: 4.66 MIL/uL (ref 3.87–5.11)
RDW: 18.3 % — AB (ref 11.5–15.5)
WBC: 6.7 10*3/uL (ref 4.0–10.5)

## 2015-08-20 LAB — COMPREHENSIVE METABOLIC PANEL
ALBUMIN: 3.4 g/dL — AB (ref 3.5–5.0)
ALK PHOS: 63 U/L (ref 38–126)
ALT: 20 U/L (ref 14–54)
ANION GAP: 12 (ref 5–15)
AST: 25 U/L (ref 15–41)
BUN: 7 mg/dL (ref 6–20)
CO2: 18 mmol/L — AB (ref 22–32)
Calcium: 8.3 mg/dL — ABNORMAL LOW (ref 8.9–10.3)
Chloride: 109 mmol/L (ref 101–111)
Creatinine, Ser: 0.93 mg/dL (ref 0.44–1.00)
GFR calc Af Amer: >60 mL/min (ref >60)
GFR calc non Af Amer: >60 mL/min (ref >60)
GLUCOSE: 108 mg/dL — AB (ref 65–99)
POTASSIUM: 3.5 mmol/L (ref 3.5–5.1)
SODIUM: 139 mmol/L (ref 135–145)
Total Bilirubin: 0.2 mg/dL — ABNORMAL LOW (ref 0.3–1.2)
Total Protein: 7.5 g/dL (ref 6.5–8.1)

## 2015-08-20 LAB — I-STAT CG4 LACTIC ACID, ED
LACTIC ACID, VENOUS: 2.2 mmol/L — AB (ref 0.5–2.0)
Lactic Acid, Venous: 2.71 mmol/L (ref 0.5–2.0)

## 2015-08-20 LAB — LIPASE, BLOOD: Lipase: 38 U/L (ref 11–51)

## 2015-08-20 LAB — I-STAT TROPONIN, ED: Troponin i, poc: 0 ng/mL (ref 0.00–0.08)

## 2015-08-20 LAB — ETHANOL: ALCOHOL ETHYL (B): 93 mg/dL — AB (ref <5)

## 2015-08-20 MED ORDER — M.V.I. ADULT IV INJ
INJECTION | Freq: Once | INTRAVENOUS | Status: AC
  Administered 2015-08-20: 02:00:00 via INTRAVENOUS
  Filled 2015-08-20: qty 1000

## 2015-08-20 MED ORDER — SODIUM CHLORIDE 0.9 % IV BOLUS (SEPSIS)
1000.0000 mL | Freq: Once | INTRAVENOUS | Status: AC
  Administered 2015-08-20: 1000 mL via INTRAVENOUS

## 2015-08-20 NOTE — ED Notes (Signed)
Pt coming from home with orthostatic changes. Pt has been drinking alcohol since Friday (gin and OJ). N/V x1 in the last 24 hrs. Pt has been dizzy. No diarrhea. 20LH 4 of zofran by EMS.   Manual BP by EMS 80/44   118/84 95-98% RA.  124 CBG. 20R  Per EMS elevation in V1 and V2 but on opposite side of QRS.

## 2015-08-20 NOTE — ED Notes (Signed)
Pt ambulated by EDT and this RN. Pt tolerated ambulation well, was able to ambulate without assistance.

## 2015-08-20 NOTE — Discharge Instructions (Signed)
Alcohol Intoxication  Alcohol intoxication occurs when the amount of alcohol that a person has consumed impairs his or her ability to mentally and physically function. Alcohol directly impairs the normal chemical activity of the brain. Drinking large amounts of alcohol can lead to changes in mental function and behavior, and it can cause many physical effects that can be harmful.   Alcohol intoxication can range in severity from mild to very severe. Various factors can affect the level of intoxication that occurs, such as the person's age, gender, weight, frequency of alcohol consumption, and the presence of other medical conditions (such as diabetes, seizures, or heart conditions). Dangerous levels of alcohol intoxication may occur when people drink large amounts of alcohol in a short period (binge drinking). Alcohol can also be especially dangerous when combined with certain prescription medicines or "recreational" drugs.  SIGNS AND SYMPTOMS  Some common signs and symptoms of mild alcohol intoxication include:  · Loss of coordination.  · Changes in mood and behavior.  · Impaired judgment.  · Slurred speech.  As alcohol intoxication progresses to more severe levels, other signs and symptoms will appear. These may include:  · Vomiting.  · Confusion and impaired memory.  · Slowed breathing.  · Seizures.  · Loss of consciousness.  DIAGNOSIS   Your health care provider will take a medical history and perform a physical exam. You will be asked about the amount and type of alcohol you have consumed. Blood tests will be done to measure the concentration of alcohol in your blood. In many places, your blood alcohol level must be lower than 80 mg/dL (0.08%) to legally drive. However, many dangerous effects of alcohol can occur at much lower levels.   TREATMENT   People with alcohol intoxication often do not require treatment. Most of the effects of alcohol intoxication are temporary, and they go away as the alcohol naturally  leaves the body. Your health care provider will monitor your condition until you are stable enough to go home. Fluids are sometimes given through an IV access tube to help prevent dehydration.   HOME CARE INSTRUCTIONS  · Do not drive after drinking alcohol.  · Stay hydrated. Drink enough water and fluids to keep your urine clear or pale yellow. Avoid caffeine.    · Only take over-the-counter or prescription medicines as directed by your health care provider.    SEEK MEDICAL CARE IF:   · You have persistent vomiting.    · You do not feel better after a few days.  · You have frequent alcohol intoxication. Your health care provider can help determine if you should see a substance use treatment counselor.  SEEK IMMEDIATE MEDICAL CARE IF:   · You become shaky or tremble when you try to stop drinking.    · You shake uncontrollably (seizure).    · You throw up (vomit) blood. This may be bright red or may look like black coffee grounds.    · You have blood in your stool. This may be bright red or may appear as a black, tarry, bad smelling stool.    · You become lightheaded or faint.    MAKE SURE YOU:   · Understand these instructions.  · Will watch your condition.  · Will get help right away if you are not doing well or get worse.     This information is not intended to replace advice given to you by your health care provider. Make sure you discuss any questions you have with your health care provider.       Document Released: 04/17/2005 Document Revised: 03/10/2013 Document Reviewed: 12/11/2012  [employer] Interactive Patient Education ©2016 [employer]

## 2015-08-20 NOTE — ED Provider Notes (Signed)
CSN: [id number]     Arrival date & time    History  By signing my name below, I, Erling Conte, attest that this documentation has been prepared under the direction and in the presence of No att. providers found. Electronically Signed: Erling Conte, ED Scribe. 08/20/2015. 10:47 PM.   Chief Complaint  Patient presents with  . Hypotension   The history is provided by the patient and the EMS personnel. No language interpreter was used.    HPI Comments: Carolyn Espinoza is a 50 y.o. female brought in by EMS with a h/o HTN who presents to the Emergency Department complaining of alcohol intoxication.. She reports associated dizziness, nausea,  and vomiting x1. Pt states her brother called EMS and she is "not sure why". Pt notes she has been drinking a large amount of alcohol (gin and OJ) over the past 24 hours. Pt denies any h/o alcohol abuse. Per EMS pt had orthostatic changes with a manual BP of 80/44, however, upon arrival to ER pt's BP was 113/65. She is a current everyday smoker with a 0.5 ppd history. She denies any abdominal pain, CP, SOB, fevers, or other associated symptoms at this time.  Past Medical History  Diagnosis Date  . HTN (hypertension) 2012   History reviewed. No pertinent past surgical history. Family History  Problem Relation Age of Onset  . Cancer Father   . Heart failure Mother   . Diabetes Mother   . Hypertension Mother   . Kidney disease Mother   . Hypertension Father    Social History  Substance Use Topics  . Smoking status: Current Every Day Smoker -- 0.50 packs/day  . Smokeless tobacco: None  . Alcohol Use: Yes     Comment: occasional   OB History    No data available     Review of Systems  Constitutional: Negative for fever.  Respiratory: Negative for shortness of breath.   Cardiovascular: Negative for chest pain.  Gastrointestinal: Positive for nausea and vomiting. Negative for abdominal pain.  Neurological: Positive for dizziness.  All other  systems reviewed and are negative.     Allergies  Review of patient's allergies indicates no known allergies.  Home Medications   Prior to Admission medications   Medication Sig Start Date End Date Taking? Authorizing Provider  albuterol (PROVENTIL HFA;VENTOLIN HFA) 108 (90 BASE) MCG/ACT inhaler Inhale 2 puffs into the lungs every 4 (four) hours as needed for wheezing or shortness of breath. 11/29/14   Janne Napoleon, NP  ipratropium (ATROVENT) 0.06 % nasal spray Place 2 sprays into both nostrils 4 (four) times daily. Patient not taking: Reported on 08/20/2015 11/29/14   Janne Napoleon, NP  predniSONE (DELTASONE) 20 MG tablet Take 3 tabs po on first day, 2 tabs second day, 2 tabs third day, 1 tab fourth day, 1 tab 5th day. Take with food. Patient not taking: Reported on 08/20/2015 11/29/14   Janne Napoleon, NP   BP 144/80 mmHg  Pulse 88  Temp(Src) 97.9 F (36.6 C) (Oral)  Resp 14  Ht 5\' 3"  (1.6 m)  Wt 193 lb (87.544 kg)  BMI 34.20 kg/m2  SpO2 98% Physical Exam  Constitutional: She is oriented to person, place, and time.  Disheveled appearing but awake and alert  HENT:  Head: Normocephalic and atraumatic.  Mucous membranes dry  Eyes: Pupils are equal, round, and reactive to light.  Cardiovascular: Normal rate, regular rhythm and normal heart sounds.   No murmur heard. Pulmonary/Chest: Effort normal and breath sounds  normal. No respiratory distress. She has no wheezes.  Abdominal: Soft. Bowel sounds are normal. There is no tenderness. There is no rebound.  Musculoskeletal: She exhibits no edema.  Neurological: She is alert and oriented to person, place, and time.  Cranial nerves II through XII intact, 5 over 5 strength in all 4 extremities  Skin: Skin is warm and dry.  Psychiatric: She has a normal mood and affect.  Nursing note and vitals reviewed.   ED Course  Procedures (including critical care time)  DIAGNOSTIC STUDIES: Oxygen Saturation is 92% on RA, adequate by my interpretation.     COORDINATION OF CARE: 1:20 AM- Will order acute abdominal series w/chest along with diagnostic lab workup and 12 lead EKG. Pt advised of plan for treatment and pt agrees.    Labs Review Labs Reviewed  CBC WITH DIFFERENTIAL/PLATELET - Abnormal; Notable for the following:    RDW 18.3 (*)    All other components within normal limits  COMPREHENSIVE METABOLIC PANEL - Abnormal; Notable for the following:    CO2 18 (*)    Glucose, Bld 108 (*)    Calcium 8.3 (*)    Albumin 3.4 (*)    Total Bilirubin 0.2 (*)    All other components within normal limits  ETHANOL - Abnormal; Notable for the following:    Alcohol, Ethyl (B) 93 (*)    All other components within normal limits  I-STAT CG4 LACTIC ACID, ED - Abnormal; Notable for the following:    Lactic Acid, Venous 2.71 (*)    All other components within normal limits  I-STAT CG4 LACTIC ACID, ED - Abnormal; Notable for the following:    Lactic Acid, Venous 2.20 (*)    All other components within normal limits  LIPASE, BLOOD  I-STAT TROPOININ, ED    Imaging Review Dg Abd Acute W/chest  08/20/2015  CLINICAL DATA:  Nausea and vomiting. Blurry vision. Weakness all tonight. EXAM: DG ABDOMEN ACUTE W/ 1V CHEST COMPARISON:  Chest 09/24/2013 FINDINGS: Normal heart size and pulmonary vascularity. No focal airspace disease or consolidation in the lungs. No blunting of costophrenic angles. No pneumothorax. Mediastinal contours appear intact. Scattered gas and stool in the colon. No small or large bowel distention. No free intra-abdominal air. No abnormal air-fluid levels. No radiopaque stones. Calcifications in the pelvis are likely phleboliths. Nonspecific right lower quadrant calcification could indicate an appendicolith. Visualized bones appear intact. IMPRESSION: No evidence of active pulmonary disease. Normal nonobstructive bowel gas pattern. Nonspecific calcification in the right lower quadrant could indicate an appendicolith. Electronically Signed    By: Lucienne Capers M.D.   On: 08/20/2015 02:25   I have personally reviewed and evaluated these images and lab results as part of my medical decision-making.   EKG Interpretation   Date/Time:  Sunday August 20 2015 01:08:00 EST Ventricular Rate:  84 PR Interval:  186 QRS Duration: 82 QT Interval:  381 QTC Calculation: 450 R Axis:   67 Text Interpretation:  Sinus rhythm Anteroseptal infarct, old No  significant change since last tracing Confirmed by Danae Oland  MD, [place]  [id number]) on 08/20/2015 10:47:31 PM      MDM   Final diagnoses:  Alcohol intoxication, uncomplicated ([place])  Dehydration    Patient presents with dizziness and nausea. Over 24 hours of increased alcohol intake.  She appears nonfocal on exam. She does appear dry. Lab work only notable for mildly elevated lactate at 2.7. Patient was given fluids and a banana bag.  Alcohol level today is 93.  Patient was observed in the emergency department for several hours. She is resting comfortably. She was able to eat and ambulate without difficulty this morning. Suspect acute alcohol intoxication as the patient's cause of symptoms.  After history, exam, and medical workup I feel the patient has been appropriately medically screened and is safe for discharge home. Pertinent diagnoses were discussed with the patient. Patient was given return precautions.   I personally performed the services described in this documentation, which was scribed in my presence. The recorded information has been reviewed and is accurate.     Merryl Hacker, MD 08/20/15 2250

## 2015-11-01 ENCOUNTER — Emergency Department (HOSPITAL_COMMUNITY): Payer: BC Managed Care – PPO

## 2015-11-01 ENCOUNTER — Encounter (HOSPITAL_COMMUNITY): Payer: Self-pay | Admitting: *Deleted

## 2015-11-01 ENCOUNTER — Ambulatory Visit (INDEPENDENT_AMBULATORY_CARE_PROVIDER_SITE_OTHER)
Admission: EM | Admit: 2015-11-01 | Discharge: 2015-11-01 | Disposition: A | Discharge status: Nursing Home (Includes ICF) | Payer: BC Managed Care – PPO | Source: Home / Self Care | Attending: Family Medicine | Admitting: Family Medicine

## 2015-11-01 DIAGNOSIS — Z79899 Other long term (current) drug therapy: Secondary | ICD-10-CM | POA: Diagnosis not present

## 2015-11-01 DIAGNOSIS — M21371 Foot drop, right foot: Secondary | ICD-10-CM | POA: Diagnosis not present

## 2015-11-01 DIAGNOSIS — I10 Essential (primary) hypertension: Secondary | ICD-10-CM | POA: Diagnosis not present

## 2015-11-01 DIAGNOSIS — R531 Weakness: Secondary | ICD-10-CM | POA: Insufficient documentation

## 2015-11-01 DIAGNOSIS — Z720 Tobacco use: Secondary | ICD-10-CM | POA: Diagnosis not present

## 2015-11-01 DIAGNOSIS — F17210 Nicotine dependence, cigarettes, uncomplicated: Secondary | ICD-10-CM

## 2015-11-01 DIAGNOSIS — R200 Anesthesia of skin: Secondary | ICD-10-CM | POA: Insufficient documentation

## 2015-11-01 DIAGNOSIS — G5731 Lesion of lateral popliteal nerve, right lower limb: Secondary | ICD-10-CM | POA: Diagnosis not present

## 2015-11-01 DIAGNOSIS — F17200 Nicotine dependence, unspecified, uncomplicated: Secondary | ICD-10-CM | POA: Insufficient documentation

## 2015-11-01 DIAGNOSIS — I639 Cerebral infarction, unspecified: Secondary | ICD-10-CM | POA: Diagnosis not present

## 2015-11-01 LAB — COMPREHENSIVE METABOLIC PANEL
ALBUMIN: 3.8 g/dL (ref 3.5–5.0)
ALK PHOS: 61 U/L (ref 38–126)
ALT: 22 U/L (ref 14–54)
AST: 27 U/L (ref 15–41)
Anion gap: 11 (ref 5–15)
BUN: 11 mg/dL (ref 6–20)
CALCIUM: 10.2 mg/dL (ref 8.9–10.3)
CO2: 21 mmol/L — AB (ref 22–32)
CREATININE: 0.77 mg/dL (ref 0.44–1.00)
Chloride: 104 mmol/L (ref 101–111)
GFR calc Af Amer: >60 mL/min (ref >60)
GFR calc non Af Amer: >60 mL/min (ref >60)
GLUCOSE: 104 mg/dL — AB (ref 65–99)
Potassium: 4.1 mmol/L (ref 3.5–5.1)
SODIUM: 136 mmol/L (ref 135–145)
Total Bilirubin: 0.3 mg/dL (ref 0.3–1.2)
Total Protein: 8.3 g/dL — ABNORMAL HIGH (ref 6.5–8.1)

## 2015-11-01 LAB — I-STAT CHEM 8, ED
BUN: 14 mg/dL (ref 6–20)
CALCIUM ION: 1.24 mmol/L — AB (ref 1.12–1.23)
CHLORIDE: 103 mmol/L (ref 101–111)
Creatinine, Ser: 0.7 mg/dL (ref 0.44–1.00)
Glucose, Bld: 99 mg/dL (ref 65–99)
HEMATOCRIT: 48 % — AB (ref 36.0–46.0)
Hemoglobin: 16.3 g/dL — ABNORMAL HIGH (ref 12.0–15.0)
Potassium: 4.1 mmol/L (ref 3.5–5.1)
SODIUM: 138 mmol/L (ref 135–145)
TCO2: 22 mmol/L (ref 0–100)

## 2015-11-01 LAB — DIFFERENTIAL
Basophils Absolute: 0 10*3/uL (ref 0.0–0.1)
Basophils Relative: 0 %
Eosinophils Absolute: 0.1 10*3/uL (ref 0.0–0.7)
Eosinophils Relative: 1 %
LYMPHS PCT: 36 %
Lymphs Abs: 3.4 10*3/uL (ref 0.7–4.0)
MONO ABS: 0.5 10*3/uL (ref 0.1–1.0)
Monocytes Relative: 5 %
NEUTROS PCT: 58 %
Neutro Abs: 5.5 10*3/uL (ref 1.7–7.7)

## 2015-11-01 LAB — CBC
HCT: 40.3 % (ref 36.0–46.0)
Hemoglobin: 13.4 g/dL (ref 12.0–15.0)
MCH: 27.5 pg (ref 26.0–34.0)
MCHC: 33.3 g/dL (ref 30.0–36.0)
MCV: 82.6 fL (ref 78.0–100.0)
PLATELETS: 287 10*3/uL (ref 150–400)
RBC: 4.88 MIL/uL (ref 3.87–5.11)
RDW: 18.7 % — ABNORMAL HIGH (ref 11.5–15.5)
WBC: 9.5 10*3/uL (ref 4.0–10.5)

## 2015-11-01 LAB — PROTIME-INR
INR: 0.99 (ref 0.00–1.49)
PROTHROMBIN TIME: 13.3 s (ref 11.6–15.2)

## 2015-11-01 LAB — I-STAT TROPONIN, ED: Troponin i, poc: 0 ng/mL (ref 0.00–0.08)

## 2015-11-01 LAB — APTT: aPTT: 27 seconds (ref 24–37)

## 2015-11-01 MED ORDER — IOPAMIDOL (ISOVUE-370) INJECTION 76%
INTRAVENOUS | Status: AC
  Filled 2015-11-01: qty 100

## 2015-11-01 NOTE — ED Provider Notes (Signed)
CSN: [id number]     Arrival date & time 11/01/15  1816 History   First MD Initiated Contact with Patient 11/01/15 2011     Chief Complaint  Patient presents with  . Extremity Weakness   (Consider location/radiation/quality/duration/timing/severity/associated sxs/prior Treatment) Patient is a 50 y.o. female presenting with extremity weakness. The history is provided by the patient.  Extremity Weakness This is a new problem. The current episode started more than 1 week ago (right arm and leg weakness , pt is left handed.). The problem has not changed since onset.Pertinent negatives include no chest pain, no abdominal pain and no headaches.    Past Medical History  Diagnosis Date  . HTN (hypertension) 2012   History reviewed. No pertinent past surgical history. Family History  Problem Relation Age of Onset  . Cancer Father   . Heart failure Mother   . Diabetes Mother   . Hypertension Mother   . Kidney disease Mother   . Hypertension Father    Social History  Substance Use Topics  . Smoking status: Current Every Day Smoker -- 0.50 packs/day  . Smokeless tobacco: None  . Alcohol Use: Yes     Comment: occasional   OB History    No data available     Review of Systems  Cardiovascular: Negative for chest pain.  Gastrointestinal: Negative for abdominal pain.  Musculoskeletal: Positive for gait problem and extremity weakness. Negative for joint swelling.  Skin: Negative.   Neurological: Positive for weakness and numbness. Negative for speech difficulty and headaches.  All other systems reviewed and are negative.   Allergies  Review of patient's allergies indicates no known allergies.  Home Medications   Prior to Admission medications   Medication Sig Start Date End Date Taking? Authorizing Provider  albuterol (PROVENTIL HFA;VENTOLIN HFA) 108 (90 BASE) MCG/ACT inhaler Inhale 2 puffs into the lungs every 4 (four) hours as needed for wheezing or shortness of breath. 11/29/14    Janne Napoleon, NP  ipratropium (ATROVENT) 0.06 % nasal spray Place 2 sprays into both nostrils 4 (four) times daily. Patient not taking: Reported on 08/20/2015 11/29/14   Janne Napoleon, NP  predniSONE (DELTASONE) 20 MG tablet Take 3 tabs po on first day, 2 tabs second day, 2 tabs third day, 1 tab fourth day, 1 tab 5th day. Take with food. Patient not taking: Reported on 08/20/2015 11/29/14   Janne Napoleon, NP   Meds Ordered and Administered this Visit  Medications - No data to display  BP 111/83 mmHg  Pulse 76  Temp(Src) 98.4 F (36.9 C) (Oral)  Resp 16  SpO2 96%  LMP 09/25/2015 No data found.   Physical Exam  Constitutional: She is oriented to person, place, and time. She appears well-developed and well-nourished. She appears distressed.  Neck: Normal range of motion. Neck supple.  Cardiovascular: Normal rate, regular rhythm, normal heart sounds and intact distal pulses.   Pulses:      Carotid pulses are 1+ on the right side, and 1+ on the left side. Pulmonary/Chest: Effort normal. She has decreased breath sounds. She has rhonchi.  Abdominal: Soft. Bowel sounds are normal.  Neurological: She is alert and oriented to person, place, and time. She displays abnormal reflex. No cranial nerve deficit or sensory deficit. She exhibits abnormal muscle tone. Coordination and gait abnormal.  Right foot weak dorsiflexion, no apparent upper ext weakness.  Skin: Skin is warm and dry.  Nursing note and vitals reviewed.   ED Course  Procedures (including critical care time)  Labs Review Labs Reviewed - No data to display  Imaging Review No results found.   Visual Acuity Review  Right Eye Distance:   Left Eye Distance:   Bilateral Distance:    Right Eye Near:   Left Eye Near:    Bilateral Near:         MDM   1. 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 Cigarette smoker    Sent for cva eval -right sided weakness, noncompliant bp meds,  smoker.    Billy Fischer, MD 11/01/15 2036

## 2015-11-01 NOTE — ED Notes (Signed)
No answer for CT scan

## 2015-11-01 NOTE — ED Notes (Addendum)
Pt states that she woke up Wednesday morning with right leg and arm weakness. States that she thought the weakness would go away but it didn't. Denies injury to extremities. Pt ambulatory to triage. Pt also c/o numbness to right thigh.

## 2015-11-01 NOTE — ED Notes (Signed)
PT  REPORTS   WEAKNESS  R  LEG    X  1  WEEK    REPORTS  THAT  SHE  HAS  PROBLEMS     BENDING  THE  R  FOOT      SHE  REPORTS    SOME  WEAKNESS     TO     R  HAND        SHE  IS   A   SMOKER  AND  NON  COMPLIANT        SHE  IS  AWAKE   AND  ALERT

## 2015-11-01 NOTE — ED Notes (Signed)
Pt answered when called for second time for transport. Patient transported to CT.

## 2015-11-01 NOTE — ED Notes (Signed)
Patient transported to CT 

## 2015-11-02 ENCOUNTER — Emergency Department (HOSPITAL_COMMUNITY): Payer: BC Managed Care – PPO

## 2015-11-02 ENCOUNTER — Emergency Department (HOSPITAL_COMMUNITY)
Admission: EM | Admit: 2015-11-02 | Discharge: 2015-11-02 | Disposition: A | Discharge status: Home / Self Care | Payer: BC Managed Care – PPO | Attending: Emergency Medicine | Admitting: Emergency Medicine

## 2015-11-02 DIAGNOSIS — M21371 Foot drop, right foot: Secondary | ICD-10-CM

## 2015-11-02 DIAGNOSIS — G5731 Lesion of lateral popliteal nerve, right lower limb: Secondary | ICD-10-CM

## 2015-11-02 LAB — CBG MONITORING, ED: Glucose-Capillary: 88 mg/dL (ref 65–99)

## 2015-11-02 NOTE — ED Provider Notes (Addendum)
CSN: [id number]     Arrival date & time 11/01/15  2035 History  By signing my name below, I, Helane Gunther, attest that this documentation has been prepared under the direction and in the presence of Everlene Balls, MD. Electronically Signed: Helane Gunther, ED Scribe. 11/02/2015. 3:24 AM.    Chief Complaint  Patient presents with  . Extremity Weakness   The history is provided by the patient. No language interpreter was used.   HPI Comments: Carolyn Espinoza is a 50 y.o. female smoker at 0.5 ppd with a PMHx of HTN who presents to the Emergency Department complaining of right-sided extremity weakness of sudden onset 1 week ago immediately after waking up. Pt states that both her right arm and her right foot felt weak and slightly numb, and that she was unable to move either one of them and was unable to stand. She notes that the arm has been steadily improving. However, she is still unable to move the right ankle, which is why she went to Urgent Care today, where she was referred to the ED for a possible stroke. She states she is unable to flex her right ankle and is dragging the foot when she walks; however, she is able to push the foot downward to drive her car, as well as being able to curl her toes. She reports associated numbness to the toes of the right foot and decreased sensation to the right foot in general. She does not recall whether she had slurred speech at onset.   Past Medical History  Diagnosis Date  . HTN (hypertension) 2012   History reviewed. No pertinent past surgical history. Family History  Problem Relation Age of Onset  . Cancer Father   . Heart failure Mother   . Diabetes Mother   . Hypertension Mother   . Kidney disease Mother   . Hypertension Father    Social History  Substance Use Topics  . Smoking status: Current Every Day Smoker -- 0.50 packs/day  . Smokeless tobacco: None  . Alcohol Use: Yes     Comment: occasional   OB History    No data available      Review of Systems A complete 10 system review of systems was obtained and all systems are negative except as noted in the HPI and PMH.   Allergies  Review of patient's allergies indicates no known allergies.  Home Medications   Prior to Admission medications   Medication Sig Start Date End Date Taking? Authorizing Provider  albuterol (PROVENTIL HFA;VENTOLIN HFA) 108 (90 BASE) MCG/ACT inhaler Inhale 2 puffs into the lungs every 4 (four) hours as needed for wheezing or shortness of breath. 11/29/14   Janne Napoleon, NP  ipratropium (ATROVENT) 0.06 % nasal spray Place 2 sprays into both nostrils 4 (four) times daily. Patient not taking: Reported on 08/20/2015 11/29/14   Janne Napoleon, NP  predniSONE (DELTASONE) 20 MG tablet Take 3 tabs po on first day, 2 tabs second day, 2 tabs third day, 1 tab fourth day, 1 tab 5th day. Take with food. Patient not taking: Reported on 08/20/2015 11/29/14   Janne Napoleon, NP   BP 115/82 mmHg  Pulse 81  Temp(Src) 98.3 F (36.8 C) (Oral)  Resp 18  SpO2 96%  LMP 09/25/2015 Physical Exam  Constitutional: She is oriented to person, place, and time. She appears well-developed and well-nourished. No distress.  HENT:  Head: Normocephalic and atraumatic.  Nose: Nose normal.  Mouth/Throat: Oropharynx is clear and moist. No oropharyngeal  exudate.  Eyes: Conjunctivae and EOM are normal. Pupils are equal, round, and reactive to light. No scleral icterus.  Neck: Normal range of motion. Neck supple. No JVD present. No tracheal deviation present. No thyromegaly present.  Cardiovascular: Normal rate, regular rhythm and normal heart sounds.  Exam reveals no gallop and no friction rub.   No murmur heard. Pulmonary/Chest: Effort normal and breath sounds normal. No respiratory distress. She has no wheezes. She exhibits no tenderness.  Abdominal: Soft. Bowel sounds are normal. She exhibits no distension and no mass. There is no tenderness. There is no rebound and no guarding.   Musculoskeletal: Normal range of motion. She exhibits no edema or tenderness.  Lymphadenopathy:    She has no cervical adenopathy.  Neurological: She is alert and oriented to person, place, and time. No cranial nerve deficit. She exhibits normal muscle tone.  0/5 strength at the right ankle joint, decreased sensation of the right foot. LL and bilateral UE 5/5 strength. 5/5 strength to the right hip and knee  Skin: Skin is warm and dry. No rash noted. No erythema. No pallor.  Nursing note and vitals reviewed.   ED Course  Procedures  DIAGNOSTIC STUDIES: Oxygen Saturation is 96% on RA, adequate by my interpretation.    COORDINATION OF CARE: 3:07 AM - Discussed plans to consult with a neurologist. Pt advised of plan for treatment and pt agrees.  3:22 AM - Consult with Dr Nicole Kindred, who recommended ordering an MRI of the head and C-spine.  Labs Review Labs Reviewed  CBC - Abnormal; Notable for the following:    RDW 18.7 (*)    All other components within normal limits  COMPREHENSIVE METABOLIC PANEL - Abnormal; Notable for the following:    CO2 21 (*)    Glucose, Bld 104 (*)    Total Protein 8.3 (*)    All other components within normal limits  I-STAT CHEM 8, ED - Abnormal; Notable for the following:    Calcium, Ion 1.24 (*)    Hemoglobin 16.3 (*)    HCT 48.0 (*)    All other components within normal limits  PROTIME-INR  APTT  DIFFERENTIAL  I-STAT TROPOININ, ED  CBG MONITORING, ED    Imaging Review Ct Head Wo Contrast  11/01/2015  CLINICAL DATA:  One day history of right arm and leg weakness. Numbness right thigh EXAM: CT HEAD WITHOUT CONTRAST TECHNIQUE: Contiguous axial images were obtained from the base of the skull through the vertex without intravenous contrast. COMPARISON:  None. FINDINGS: The ventricles are normal in size and configuration. There is a cavum septum pellucidum, an anatomic variant. There is no intracranial mass, hemorrhage, extra-axial fluid collection, or  midline shift. There are scattered small calcifications without surrounding edema, likely small granulomas. Elsewhere gray-white compartments appear normal. No acute infarct evident. The bony calvarium appears intact. The mastoid air cells are clear. Visualized orbits appear symmetric bilaterally. There is opacification of the visualize left superior maxillary antrum. IMPRESSION: Scattered small calcifications, likely granulomas. No intracranial mass, hemorrhage, or focal gray -white compartment lesions/acute appearing infarct. Left maxillary sinus disease. Electronically Signed   By: Lowella Grip III M.D.   On: 11/01/2015 23:37   Mr Brain Wo Contrast  11/02/2015  CLINICAL DATA:  Woke up yesterday with RIGHT extremity weakness, not improved. History of hypertension. EXAM: MRI HEAD WITHOUT CONTRAST TECHNIQUE: Multiplanar, multiecho pulse sequences of the brain and surrounding structures were obtained without intravenous contrast. COMPARISON:  CT head November 01, 2015 FINDINGS: The ventricles  and sulci are normal for patient's age, cavum septum pellucidum et vergae is a normal variant. No abnormal parenchymal signal, mass lesions, mass effect. No reduced diffusion to suggest acute ischemia. No susceptibility artifact to suggest hemorrhage. No abnormal extra-axial fluid collections. No extra-axial masses though, contrast enhanced sequences would be more sensitive. Normal major intracranial vascular flow voids seen at the skull base. Ocular globes and orbital contents are unremarkable though not tailored for evaluation. No abnormal sellar expansion. No suspicious calvarial bone marrow signal. Craniocervical junction maintained. Mild LEFT maxillary antral expansion with T2 bright mucosal retention cyst versus possible LEFT mucocele. Trace mastoid effusions. Patient is edentulous. IMPRESSION: Negative noncontrast MRI brain. Possible LEFT maxillary mucocele. Electronically Signed   By: Elon Alas M.D.   On:  11/02/2015 04:44   Mr Cervical Spine Wo Contrast  11/02/2015  CLINICAL DATA:  Woke up Wednesday with RIGHT leg and arm weakness, not improving. EXAM: MRI CERVICAL SPINE WITHOUT CONTRAST TECHNIQUE: Multiplanar, multisequence MR imaging of the cervical spine was performed. No intravenous contrast was administered. COMPARISON:  None. FINDINGS: Mild motion degraded examination. Cervical vertebral bodies intact and aligned with straightened cervical lordosis. Intervertebral discs demonstrate normal morphology with slight desiccation. Mild chronic discogenic endplate changes [id number], [id number]-GRM and C6-7. T1 and T2 bright hemangioma at C6. No STIR signal abnormality to suggest acute osseous process. Motion results in spurious linear bright signal on the sagittal T2, not corroborated on the axial T2 or sagittal T2 stir. No syrinx or myelomalacia. Included prevertebral and paraspinal soft tissues are normal. Patent vertebral arteries. No MR findings of ligamentous injury. Level by level evaluation: C2-3 through C5-6: No disc bulge, canal stenosis nor neural foraminal narrowing. C6-7: Small central disc protrusion without canal stenosis or neural foraminal narrowing. C7-T1: No disc bulge, canal stenosis nor neural foraminal narrowing. IMPRESSION: Mildly motion degraded examination. Small C6-7 disc protrusion without acute fracture, malalignment, canal stenosis or neural foraminal narrowing. Electronically Signed   By: Elon Alas M.D.   On: 11/02/2015 05:15   Mr Lumbar Spine Wo Contrast  11/02/2015  CLINICAL DATA:  50 year old hypertensive female with weakness and right arm right leg for the past week. Numbness top of right foot with flow off venous of right foot. Subsequent encounter. EXAM: MRI LUMBAR SPINE WITHOUT CONTRAST TECHNIQUE: Multiplanar, multisequence MR imaging of the lumbar spine was performed. No intravenous contrast was administered. COMPARISON:  No comparison lumbar spine MR. FINDINGS: Last fully open disk  space is labeled L5-S1. Present examination incorporates from T10-11 disc space through the lower sacrum. Conus L1-2 level. T10-11 through L1-2 unremarkable. L2-3: Mild right facet bony overgrowth. L3-4:  Minimal facet degenerative changes. L4-5: Prominent facet degenerative changes containing fluid. 2 mm anterior slip L4. Bulge. Mild bilateral foraminal narrowing with slight encroachment upon but not significant compression of the exiting L4 nerve roots. Dorsal epidural fat. Multifactorial mild thecal sac narrowing. L5-S1: Facet degenerative changes. Minimal anterior slip L5. Minimal bulge. Mild bilateral foraminal narrowing without nerve root compression. Prominent epidural fat contributes to narrowing of the thecal sac. Enlarged uterus containing multiple fibroids with superior extension and impression upon the dome of the bladder incompletely assessed on present exam. IMPRESSION: L4-5 prominent facet degenerative changes. 2 mm anterior slip L4. Bulge. Mild bilateral foraminal narrowing with slight encroachment upon but not significant compression of the exiting L4 nerve roots. Dorsal epidural fat. Multifactorial mild thecal sac narrowing. L5-S1 multifactorial mild bilateral foraminal narrowing without nerve root compression. Prominent epidural fat contributes to narrowing of the thecal sac.  Enlarged uterus containing multiple fibroids with superior extension and impression upon the dome of the bladder incompletely assessed on present exam. Electronically Signed   By: Genia Del M.D.   On: 11/02/2015 07:43   I have personally reviewed and evaluated these images and lab results as part of my medical decision-making.   EKG Interpretation   Date/Time:  Wednesday November 01 2015 20:57:24 EDT Ventricular Rate:  77 PR Interval:  160 QRS Duration: 76 QT Interval:  382 QTC Calculation: 432 R Axis:   53 Text Interpretation:  Normal sinus rhythm Septal infarct , age  undetermined Abnormal ECG No significant  change since last tracing  Confirmed by Glynn Octave [phone]) on 11/02/2015 3:21:09 AM      MDM   Final diagnoses:  None   Patient presents emergency department for right upper and lower extremity weakness. I concern for an acute stroke. CT scan is negative. I spoke with Dr. Nicole Kindred who recommends MRI of the head and C-spine. This is obtained through the night and does not show any acute abnormalities. He has now come to evaluate the patient after MRI and states that this is consistent with a foot drop. He is now requesting MRI of the lumbar spine to evaluate for any cord compression.    7:49 AM MRI obtained and does not show an a significant cord compression.  FU with neurology recommend.  Patient advised not to drive until she can follow up.  She appears well and in NAD.  VS remain within her normal limits and she is safe for DC    I personally performed the services described in this documentation, which was scribed in my presence. The recorded information has been reviewed and is accurate.     Everlene Balls, MD 11/02/15 [id number]  Everlene Balls, MD 11/02/15 [phone]

## 2015-11-02 NOTE — Consult Note (Signed)
Admission H&P    Chief Complaint: Right arm and leg weakness 1 week.  HPI: Carolyn Espinoza is an 50 y.o. female with a history of hypertension presenting with weakness involving right arm and right leg for one week. She describes floppiness of her right foot as well as numbness involving mostly the top of her foot. Weakness of her right upper extremity is somewhat vague in description. Patient has continued to carry out her activities of daily living as well as continue working outside of her home. 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 No speech changes were noted. She's had no facial droop. Is no history of stroke nor TIA. CT scan of the head showed no acute intracranial abnormality. MRI of her brain showed no signs of an acute stroke. C-spine MRI showed a small C6-7 disc protrusion without cord compression or neuroforaminal narrowing. She's had no bowel or bladder control problem.  Past Medical History  Diagnosis Date  . HTN (hypertension) 2012    History reviewed. No pertinent past surgical history.  Family History  Problem Relation Age of Onset  . Cancer Father   . Heart failure Mother   . Diabetes Mother   . Hypertension Mother   . Kidney disease Mother   . Hypertension Father    Social History:  reports that she has been smoking.  She does not have any smokeless tobacco history on file. She reports that she drinks alcohol. She reports that she does not use illicit drugs.  Allergies: No Known Allergies  Medications: Patient's preadmission medications were reviewed by me.  ROS: History obtained from the patient  General ROS: negative for - chills, fatigue, fever, night sweats, weight gain or weight loss Psychological ROS: negative for - behavioral disorder, hallucinations, memory difficulties, mood swings or suicidal ideation Ophthalmic ROS: negative for - blurry vision, double vision, eye pain or loss of vision ENT ROS: negative for - epistaxis, nasal discharge, oral  lesions, sore throat, tinnitus or vertigo Allergy and Immunology ROS: negative for - hives or itchy/watery eyes Hematological and Lymphatic ROS: negative for - bleeding problems, bruising or swollen lymph nodes Endocrine ROS: negative for - galactorrhea, hair pattern changes, polydipsia/polyuria or temperature intolerance Respiratory ROS: negative for - cough, hemoptysis, shortness of breath or wheezing Cardiovascular ROS: negative for - chest pain, dyspnea on exertion, edema or irregular heartbeat Gastrointestinal ROS: negative for - abdominal pain, diarrhea, hematemesis, nausea/vomiting or stool incontinence Genito-Urinary ROS: negative for - dysuria, hematuria, incontinence or urinary frequency/urgency Musculoskeletal ROS: negative for - joint swelling or muscular weakness Neurological ROS: as noted in HPI Dermatological ROS: negative for rash and skin lesion changes  Physical Examination: Blood pressure 136/87, pulse 72, temperature 98.3 F (36.8 C), temperature source Oral, resp. rate 19, last menstrual period 09/25/2015, SpO2 96 %.  HEENT-  Normocephalic, no lesions, without obvious abnormality.  Normal external eye and conjunctiva.  Normal TM's bilaterally.  Normal auditory canals and external ears. Normal external nose, mucus membranes and septum.  Normal pharynx. Neck supple with no masses, nodes, nodules or enlargement. Cardiovascular - regular rate and rhythm, S1, S2 normal, no murmur, click, rub or gallop Lungs - chest clear, no wheezing, rales, normal symmetric air entry Abdomen - soft, non-tender; bowel sounds normal; no masses,  no organomegaly Extremities - no joint deformities, effusion, or inflammation and no edema  Neurologic Examination: Mental Status: Alert, oriented, thought content appropriate.  Speech fluent without evidence of aphasia. Able to follow commands without difficulty. Cranial Nerves: II-Visual  fields were normal. III/IV/VI-Pupils were equal and reacted  normally to light. Extraocular movements were full and conjugate.    V/VII-no facial numbness and no facial weakness. VIII-normal. X-normal speech and symmetrical palatal movement. XI: trapezius strength/neck flexion strength normal bilaterally XII-midline tongue extension with normal strength. Motor: 0/5 strength of right tibialis anterior, evertors of the foot and extensors of toes; normal strength of right gastrocnemius muscle; normal right hip flexors, quadriceps and hamstrings. Strength of right upper extremity and left upper and lower extremities was normal proximally and distally. Sensory: Numbness involving dorsum of right foot, otherwise sensory findings were unremarkable. Deep Tendon Reflexes: 1+ and symmetric in upper extremities and absent in lower extremities. Plantars: Mute bilaterally Cerebellar: Normal finger-to-nose testing.  Results for orders placed or performed during the hospital encounter of 11/02/15 (from the past 48 hour(s))  Protime-INR     Status: None   Collection Time: 11/01/15  9:02 PM  Result Value Ref Range   Prothrombin Time 13.3 11.6 - 15.2 seconds   INR 0.99 0.00 - 1.49  APTT     Status: None   Collection Time: 11/01/15  9:02 PM  Result Value Ref Range   aPTT 27 24 - 37 seconds  CBC     Status: Abnormal   Collection Time: 11/01/15  9:02 PM  Result Value Ref Range   WBC 9.5 4.0 - 10.5 K/uL   RBC 4.88 3.87 - 5.11 MIL/uL   Hemoglobin 13.4 12.0 - 15.0 g/dL   HCT 40.3 36.0 - 46.0 %   MCV 82.6 78.0 - 100.0 fL   MCH 27.5 26.0 - 34.0 pg   MCHC 33.3 30.0 - 36.0 g/dL   RDW 18.7 (H) 11.5 - 15.5 %   Platelets 287 150 - 400 K/uL  Differential     Status: None   Collection Time: 11/01/15  9:02 PM  Result Value Ref Range   Neutrophils Relative % 58 %   Neutro Abs 5.5 1.7 - 7.7 K/uL   Lymphocytes Relative 36 %   Lymphs Abs 3.4 0.7 - 4.0 K/uL   Monocytes Relative 5 %   Monocytes Absolute 0.5 0.1 - 1.0 K/uL   Eosinophils Relative 1 %   Eosinophils Absolute  0.1 0.0 - 0.7 K/uL   Basophils Relative 0 %   Basophils Absolute 0.0 0.0 - 0.1 K/uL  Comprehensive metabolic panel     Status: Abnormal   Collection Time: 11/01/15  9:02 PM  Result Value Ref Range   Sodium 136 135 - 145 mmol/L   Potassium 4.1 3.5 - 5.1 mmol/L   Chloride 104 101 - 111 mmol/L   CO2 21 (L) 22 - 32 mmol/L   Glucose, Bld 104 (H) 65 - 99 mg/dL   BUN 11 6 - 20 mg/dL   Creatinine, Ser 0.77 0.44 - 1.00 mg/dL   Calcium 10.2 8.9 - 10.3 mg/dL   Total Protein 8.3 (H) 6.5 - 8.1 g/dL   Albumin 3.8 3.5 - 5.0 g/dL   AST 27 15 - 41 U/L   ALT 22 14 - 54 U/L   Alkaline Phosphatase 61 38 - 126 U/L   Total Bilirubin 0.3 0.3 - 1.2 mg/dL   GFR calc non Af Amer >60 >60 mL/min   GFR calc Af Amer >60 >60 mL/min    Comment: (NOTE) The eGFR has been calculated using the CKD EPI equation. This calculation has not been validated in all clinical situations. eGFR's persistently <60 mL/min signify possible Chronic Kidney Disease.  Anion gap 11 5 - 15  I-Stat Chem 8, ED  (not at [facility], [facility])     Status: Abnormal   Collection Time: 11/01/15  9:20 PM  Result Value Ref Range   Sodium 138 135 - 145 mmol/L   Potassium 4.1 3.5 - 5.1 mmol/L   Chloride 103 101 - 111 mmol/L   BUN 14 6 - 20 mg/dL   Creatinine, Ser 0.70 0.44 - 1.00 mg/dL   Glucose, Bld 99 65 - 99 mg/dL   Calcium, Ion 1.24 (H) 1.12 - 1.23 mmol/L   TCO2 22 0 - 100 mmol/L   Hemoglobin 16.3 (H) 12.0 - 15.0 g/dL   HCT 48.0 (H) 36.0 - 46.0 %  I-stat troponin, ED (not at [facility], [facility])     Status: None   Collection Time: 11/01/15  9:40 PM  Result Value Ref Range   Troponin i, poc 0.00 0.00 - 0.08 ng/mL   Comment 3            Comment: Due to the release kinetics of cTnI, a negative result within the first hours of the onset of symptoms does not rule out myocardial infarction with certainty. If myocardial infarction is still suspected, repeat the test at appropriate intervals.    Ct Head Wo Contrast  11/01/2015  CLINICAL DATA:  One day  history of right arm and leg weakness. Numbness right thigh EXAM: CT HEAD WITHOUT CONTRAST TECHNIQUE: Contiguous axial images were obtained from the base of the skull through the vertex without intravenous contrast. COMPARISON:  None. FINDINGS: The ventricles are normal in size and configuration. There is a cavum septum pellucidum, an anatomic variant. There is no intracranial mass, hemorrhage, extra-axial fluid collection, or midline shift. There are scattered small calcifications without surrounding edema, likely small granulomas. Elsewhere gray-white compartments appear normal. No acute infarct evident. The bony calvarium appears intact. The mastoid air cells are clear. Visualized orbits appear symmetric bilaterally. There is opacification of the visualize left superior maxillary antrum. IMPRESSION: Scattered small calcifications, likely granulomas. No intracranial mass, hemorrhage, or focal gray -white compartment lesions/acute appearing infarct. Left maxillary sinus disease. Electronically Signed   By: Lowella Grip III M.D.   On: 11/01/2015 23:37   Mr Brain Wo Contrast  11/02/2015  CLINICAL DATA:  Woke up yesterday with RIGHT extremity weakness, not improved. History of hypertension. EXAM: MRI HEAD WITHOUT CONTRAST TECHNIQUE: Multiplanar, multiecho pulse sequences of the brain and surrounding structures were obtained without intravenous contrast. COMPARISON:  CT head November 01, 2015 FINDINGS: The ventricles and sulci are normal for patient's age, cavum septum pellucidum et vergae is a normal variant. No abnormal parenchymal signal, mass lesions, mass effect. No reduced diffusion to suggest acute ischemia. No susceptibility artifact to suggest hemorrhage. No abnormal extra-axial fluid collections. No extra-axial masses though, contrast enhanced sequences would be more sensitive. Normal major intracranial vascular flow voids seen at the skull base. Ocular globes and orbital contents are unremarkable though  not tailored for evaluation. No abnormal sellar expansion. No suspicious calvarial bone marrow signal. Craniocervical junction maintained. Mild LEFT maxillary antral expansion with T2 bright mucosal retention cyst versus possible LEFT mucocele. Trace mastoid effusions. Patient is edentulous. IMPRESSION: Negative noncontrast MRI brain. Possible LEFT maxillary mucocele. Electronically Signed   By: Elon Alas M.D.   On: 11/02/2015 04:44   Mr Cervical Spine Wo Contrast  11/02/2015  CLINICAL DATA:  Woke up Wednesday with RIGHT leg and arm weakness, not improving. EXAM: MRI CERVICAL SPINE WITHOUT CONTRAST TECHNIQUE: Multiplanar, multisequence  MR imaging of the cervical spine was performed. No intravenous contrast was administered. COMPARISON:  None. FINDINGS: Mild motion degraded examination. Cervical vertebral bodies intact and aligned with straightened cervical lordosis. Intervertebral discs demonstrate normal morphology with slight desiccation. Mild chronic discogenic endplate changes G2-5, K2-7 and C6-7. T1 and T2 bright hemangioma at C6. No STIR signal abnormality to suggest acute osseous process. Motion results in spurious linear bright signal on the sagittal T2, not corroborated on the axial T2 or sagittal T2 stir. No syrinx or myelomalacia. Included prevertebral and paraspinal soft tissues are normal. Patent vertebral arteries. No MR findings of ligamentous injury. Level by level evaluation: C2-3 through C5-6: No disc bulge, canal stenosis nor neural foraminal narrowing. C6-7: Small central disc protrusion without canal stenosis or neural foraminal narrowing. C7-T1: No disc bulge, canal stenosis nor neural foraminal narrowing. IMPRESSION: Mildly motion degraded examination. Small C6-7 disc protrusion without acute fracture, malalignment, canal stenosis or neural foraminal narrowing. Electronically Signed   By: Elon Alas M.D.   On: 11/02/2015 05:15    Assessment/Plan 50 year old lady with a  history of hypertension presenting with right foot drop for one week. She has no signs of an acute stroke clinically nor indications on CT and MRI of her brain. Patient also had no signs clinically of cervical cord compression. 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 However, right L5 radiculopathy will need to be ruled out.  Recommendations: 1. MRI of lumbar spine to rule out right L5 radiculopathy 2. Neurosurgery consult if acute lumbar radiculopathy is demonstrated on MRI 3. Outpatient neurology referral 4. Outpatient PT referral referral  C.R. Nicole Kindred, MD [facility] [phone]  11/02/2015, 6:07 AM

## 2015-11-02 NOTE — Discharge Instructions (Signed)
MRI RESULTS: IMPRESSION: L4-5 prominent facet degenerative changes. 2 mm anterior slip L4. Bulge. Mild bilateral foraminal narrowing with slight encroachment upon but not significant compression of the exiting L4 nerve roots. Dorsal epidural fat. Multifactorial mild thecal sac narrowing.  L5-S1 multifactorial mild bilateral foraminal narrowing without nerve root compression. Prominent epidural fat contributes to narrowing of the thecal sac.  Enlarged uterus containing multiple fibroids with superior extension and impression upon the dome of the bladder incompletely assessed on present exam.   Foot drop, which is also sometimes called a dropped foot, is a condition in which it is difficult to lift up the front part of your foot. As a result your forefoot and toes tend to catch or drag on the floor as you walk. It can be temporary or permanent and most commonly affects one side only.  What is foot drop? Foot drop is an abnormal walk (gait) which is caused by a tendency of the front half of the foot to drop downwards as you walk along. Your foot can catch on the floor as you swing your leg forwards to take a step. The gait of foot drop may involve:  As you walk along, the affected foot (or feet) catch on the floor. As you walk along you lift the leg high to avoid the foot catching (high stepping gait). People doing this often tend to walk on tiptoe on the other side to equalise the sides. As you walk you swing the affected leg out to the side to avoid it catching on the floor. Foot drop usually affects just one foot. However, it can affect both sides, either equally or to different degrees. It may be temporary or permanent.  There are several grades of foot drop. These are measured from 0 to 5 depending on how much strength and movement there is in the muscles which lift the foot. Grade 0 is the weakest.  What causes foot drop? Foot drop is usually caused by malfunction of a nerve in the  lower leg due to problems affecting it either low down in the leg, or higher up in the spine where its fibres originate.  This nerve is called the common peroneal nerve. However, it is also sometimes called the common fibular nerve, the external popliteal nerve or the lateral popliteal nerve. It's a small nerve that comes down off the sciatic nerve in the thigh. It runs down the back of the knee and winds around the top of the fibula to go into the muscles of the lower leg. It is very near the surface at this point and can be easily bruised or compressed.  The most common causes are:  Injury to the common peroneal nerve. Lower back damage (including a slipped disc affecting the nerves in the lower leg). Foot drop can also be due to other causes of nerve damage. More rarely, it can be due to damage to the muscles of the lower leg or to poisonous substances or a tumour. These include:  Hip replacement. Knee surgery. Sciatic nerve damage. Cauda equina syndrome. (This is compression of the nerves in the tail of the spinal cord, usually caused by a slipped disc or tumour.) Diabetes with peripheral neuropathy. Stroke. Transient ischaemic attack (TIA). Multiple sclerosis. Cerebral palsy. Charcot-Marie-Tooth disease. Poliomyelitis (rarely causes isolated foot drop). Motor neurone disease. Friedreich's ataxia. Brain tumour. Adverse drug or alcohol reaction. Patients with pain on the soles of the feet may also walk with a high stepping gait which looks similar. However,  they do not have foot drop, they are lifting their feet for a different reason.  What does the common peroneal nerve do and how can it be injured? The peroneal nerve controls the muscles that lift your foot. This nerve is quite exposed to trauma where it runs just under the skin on the outer side of the knee. Activities that compress this nerve can increase your risk of foot drop. Examples include:  Crossing the legs. People who  habitually cross their legs can compress the peroneal nerve on their uppermost leg, particularly if they are slim. Prolonged kneeling. Occupations that involve prolonged squatting or kneeling can result in temporary foot drop. Wearing a leg cast. Plaster casts that enclose the ankle and end just below the knee can exert pressure on the peroneal nerve and cause foot drop. Related discussions Flat foot reconstruction with a calcaneal osteotomy, FDL tendon transfer and spring ligament repair [id number] 7replies Swollen big toe [id number] 0replies 5th Metatarsal Fracture Recovery [id number] Start a discussion  How does foot drop differ from normal walking? The 'cycle' of action in normal walking is as follows:  The foot moves forwards (swing phase). The foot touches the ground. This is usually first with the heel (initial contact, sometimes called heel strike or foot strike) and then forwards on to the ball of the foot. The foot pushes off and leaves the ground again (terminal contact, or 'foot off'). The normal foot can flex upwards (dorsiflexion). It can also invert (turn so that the soles tend to face each other) or evert (the opposite of inversion). In foot drop these movements (which occur mainly in heel contact and in the swing phase, are absent. Therefore:  The swing phase may involve bending the leg at the knee to lift the foot away, rather like climbing stairs. The initial contact is not with the feet but with the whole of the foot which 'slaps' or plants on to the floor at once The 'foot off' motion does not function properly at all and a walking stick or cane may be needed to help lift the foot. How is foot drop diagnosed? Foot drop is usually diagnosed on examination. Your doctor will watch you walk and may check your leg muscles for weakness. They will also assess nerve function by checking your reflexes and the sensation in the skin.  Blog Posts Feet - getting to the root  of the problem Foot health: 5 common issues and treatments Read more blog posts  How is foot drop investigated? Foot drop is a symptom rather than a diagnosis and your doctor will want to understand what has caused it. Investigation for the cause of foot drop may include:  X-rays. Plain X-rays may be used to look for a soft tissue growth or a bone abnormality that may be causing your symptoms. Ultrasound. This may be used to check for cysts or tumours that may be pressing on the nerve. Computed tomography (CT) scan. This can be used to look for growths (masses) or other changes anywhere in the body. Magnetic resonance imaging (MRI). MRI is particularly useful in visualising soft tissue lesions that may be compressing a nerve, such as a prolapsed disc (slipped disc) in the back. It can also examine the brain for the characteristic lesions of multiple sclerosis. Electromyography (EMG) and nerve conduction studies. These measure electrical activity in the muscles and nerves to look for the location of the damage along the affected nerve. Related articles Broken Toe Metatarsalgia Morton's Neuroma More  related content  How is foot drop treated? If the underlying cause can be treated, foot drop may improve or disappear. If the underlying cause can't be treated, foot drop may be permanent.  In addition to treatment of the underlying problem, specific treatment may include:  Braces or splints. These help hold the foot in a normal position. Ankle-foot orthoses. These are specialised L-shaped ankle splints. They simply hold the foot at 90 to the lower leg so that it can't drop down. Physiotherapy. Exercises to strengthen leg muscles may improve walking problems associated with foot drop. Stretching exercises can prevent the development of stiffness in the heel. Learning to use a high stepping gait or swinging gait is an alternative approach that some people prefer. Specialised shoes. Shoes fitted with  spring-loaded braces can help prevent the foot dropping whilst walking. One type uses a cuff around the ankle, a spring above and a hook in the shoelace area which connects to the spring and pulls the foot up during walking. Nerve stimulation. Sometimes stimulating the common peroneal nerve electrically improves foot drop. This type of treatment is usually used in people with disabilities and is sometimes called neuromuscular electrical stimulation. Many people with multiple sclerosis, or who have had a stroke, have had success with it. Surgery. Depending upon the cause, nerve surgery is occasionally helpful, aiming to repair or graft the nerve. If foot drop is long-standing, complex surgery that shifts working tendons to a different position is occasionally considered. What can I do myself? The danger of foot drop is that it can increase your risk of tripping as the tow catches on the floor. It can be sensible to take precautions at home to reduce the risk of falls and injury:  Keep all floors clear of clutter. Avoid loose rugs, electrical cords and other trip hazards Make sure rooms and stairways are well lit. Stick fluorescent tape on the top and bottom steps of stairways to remind you to prepare for them. Seek advice from a suitably qualified health professional on walking aids and exercises that may help keep you safe.

## 2015-11-02 NOTE — ED Notes (Signed)
Pt walking around in and out of ED. NAD noted.

## 2016-06-18 ENCOUNTER — Encounter: Payer: Self-pay | Admitting: Internal Medicine

## 2016-06-21 ENCOUNTER — Other Ambulatory Visit: Payer: Self-pay | Admitting: Specialist

## 2016-06-21 DIAGNOSIS — Z1231 Encounter for screening mammogram for malignant neoplasm of breast: Secondary | ICD-10-CM

## 2016-07-26 ENCOUNTER — Telehealth: Payer: Self-pay | Admitting: Internal Medicine

## 2016-07-26 NOTE — Telephone Encounter (Signed)
Patient has Pre Visit Appointment on 07/29/16 at 4:00pm with RM 51. I have left patient several messages informing her that her procedure date has changed but patient has not returned my calls. Could someone inform patient of new procedure date/time when she comes in on Monday? Thanks.

## 2016-07-26 NOTE — Telephone Encounter (Signed)
Will do!

## 2016-08-12 ENCOUNTER — Encounter: Payer: BC Managed Care – PPO | Admitting: Internal Medicine

## 2016-08-13 ENCOUNTER — Ambulatory Visit (AMBULATORY_SURGERY_CENTER): Payer: Self-pay

## 2016-08-13 VITALS — Ht 63.0 in | Wt 202.2 lb

## 2016-08-13 DIAGNOSIS — Z1211 Encounter for screening for malignant neoplasm of colon: Secondary | ICD-10-CM

## 2016-08-13 MED ORDER — NA SULFATE-K SULFATE-MG SULF 17.5-3.13-1.6 GM/177ML PO SOLN: ORAL | 0 refills | Status: DC

## 2016-08-13 NOTE — Progress Notes (Signed)
Per pt, no allergies to soy or egg products.Pt not taking any weight loss meds or using  O2 at home. 

## 2016-08-14 ENCOUNTER — Encounter: Payer: Self-pay | Admitting: Internal Medicine

## 2016-08-26 ENCOUNTER — Encounter: Payer: Self-pay | Admitting: Internal Medicine

## 2016-08-26 ENCOUNTER — Ambulatory Visit (AMBULATORY_SURGERY_CENTER): Payer: BC Managed Care – PPO | Admitting: Internal Medicine

## 2016-08-26 VITALS — BP 177/87 | HR 79 | Temp 97.8°F | Resp 15 | Ht 63.0 in | Wt 202.0 lb

## 2016-08-26 DIAGNOSIS — Z1212 Encounter for screening for malignant neoplasm of rectum: Secondary | ICD-10-CM

## 2016-08-26 DIAGNOSIS — D122 Benign neoplasm of ascending colon: Secondary | ICD-10-CM

## 2016-08-26 DIAGNOSIS — Z1211 Encounter for screening for malignant neoplasm of colon: Secondary | ICD-10-CM

## 2016-08-26 MED ORDER — SODIUM CHLORIDE 0.9 % IV SOLN: 500.0000 mL | INTRAVENOUS | Status: DC

## 2016-08-26 NOTE — Progress Notes (Signed)
Report given to PACU, vss 

## 2016-08-26 NOTE — Progress Notes (Signed)
Called to room to assist during endoscopic procedure.  Patient ID and intended procedure confirmed with present staff. Received instructions for my participation in the procedure from the performing physician.  

## 2016-08-26 NOTE — Op Note (Signed)
[facility] Patient Name: Carolyn Espinoza Procedure Date: 08/26/2016 1:55 PM MRN: [id number] Endoscopist: Jerene Bears , MD Age: 51 Referring MD:  Date of Birth: [birth year] Gender: Female Account #: [id number] Procedure:                Colonoscopy Indications:              Screening for colorectal malignant neoplasm, This                            is the patient's first colonoscopy Medicines:                Monitored Anesthesia Care Procedure:                Pre-Anesthesia Assessment:                           - Prior to the procedure, a History and Physical                            was performed, and patient medications and                            allergies were reviewed. The patient's tolerance of                            previous anesthesia was also reviewed. The risks                            and benefits of the procedure and the sedation                            options and risks were discussed with the patient.                            All questions were answered, and informed consent                            was obtained. Prior Anticoagulants: The patient has                            taken no previous anticoagulant or antiplatelet                            agents. ASA Grade Assessment: II - A patient with                            mild systemic disease. After reviewing the risks                            and benefits, the patient was deemed in                            satisfactory condition to undergo the procedure.  After obtaining informed consent, the colonoscope                            was passed under direct vision. Throughout the                            procedure, the patient's blood pressure, pulse, and                            oxygen saturations were monitored continuously. The                            Model PCF-H190DL [id number]) scope was introduced                            through the anus and advanced  to the the cecum,                            identified by appendiceal orifice and ileocecal                            valve. The colonoscopy was performed without                            difficulty. The patient tolerated the procedure                            well. The quality of the bowel preparation was                            good. The ileocecal valve, appendiceal orifice, and                            rectum were photographed. Scope In: 2:10:01 PM Scope Out: 2:49:20 PM Scope Withdrawal Time: 0 hours 34 minutes 35 seconds  Total Procedure Duration: 0 hours 39 minutes 19 seconds  Findings:                 The digital rectal exam was normal.                           A 35 mm polyp was found in the ascending colon. The                            polyp was pedunculated. Polyp head and stalk was                            successfully injected with 3 mL of a 1:10,000                            solution of epinephrine for sclerosing before                            polypectomy. The polyp was removed with a  hot                            snare. Resection and retrieval were complete. To                            prevent bleeding after the polypectomy, three                            hemostatic clips were successfully placed (MR                            conditional). There was no bleeding during, or at                            the end, of the procedure. Area distal to the                            polypectomy site was tattooed with an injection of                            2 mL of Spot (carbon black).                           Three sessile polyps were found in the ascending                            colon. The polyps were 3 to 5 mm in size. These                            polyps were removed with a cold snare. Resection                            and retrieval were complete.                           Multiple small and large-mouthed diverticula were                             found from ascending colon to sigmoid colon.                           Internal hemorrhoids were found during                            retroflexion. The hemorrhoids were small. Complications:            No immediate complications. Estimated Blood Loss:     Estimated blood loss was minimal. Impression:               - One 35 mm polyp in the ascending colon, removed                            with a hot snare. Resected and retrieved. Injected.  Clips (MR conditional) were placed. Tattooed.                           - Three 3 to 5 mm polyps in the ascending colon,                            removed with a cold snare. Resected and retrieved.                           - Mild diverticulosis from ascending colon to                            sigmoid colon.                           - Internal hemorrhoids. Recommendation:           - Patient has a contact number available for                            emergencies. The signs and symptoms of potential                            delayed complications were discussed with the                            patient. Return to normal activities tomorrow.                            Written discharge instructions were provided to the                            patient.                           - Resume previous diet.                           - Continue present medications.                           - Await pathology results.                           - Repeat colonoscopy is recommended. The                            colonoscopy date will be determined after pathology                            results from today's exam become available for                            review.                           - No aspirin, ibuprofen, naproxen, or  other                            non-steroidal anti-inflammatory drugs for 3 weeks                            after polyp removal. Jerene Bears, MD 08/26/2016 2:55:42 PM This report has  been signed electronically.

## 2016-08-26 NOTE — Patient Instructions (Signed)
YOU HAD AN ENDOSCOPIC PROCEDURE TODAY AT [facility]:   Refer to the procedure report that was given to you for any specific questions about what was found during the examination.  If the procedure report does not answer your questions, please call your gastroenterologist to clarify.  If you requested that your care partner not be given the details of your procedure findings, then the procedure report has been included in a sealed envelope for you to review at your convenience later.  YOU SHOULD EXPECT: Some feelings of bloating in the abdomen. Passage of more gas than usual.  Walking can help get rid of the air that was put into your GI tract during the procedure and reduce the bloating. If you had a lower endoscopy (such as a colonoscopy or flexible sigmoidoscopy) you may notice spotting of blood in your stool or on the toilet paper. If you underwent a bowel prep for your procedure, you may not have a normal bowel movement for a few days.  Please Note:  You might notice some irritation and congestion in your nose or some drainage.  This is from the oxygen used during your procedure.  There is no need for concern and it should clear up in a day or so.  SYMPTOMS TO REPORT IMMEDIATELY:   Following lower endoscopy (colonoscopy or flexible sigmoidoscopy):  Excessive amounts of blood in the stool  Significant tenderness or worsening of abdominal pains  Swelling of the abdomen that is new, acute  Fever of 100F or higher  For urgent or emergent issues, a gastroenterologist can be reached at any hour by calling [phone].  MEDICATIONS: No Aspirin, ibuprofen, naproxen, or other non-steroidal anti-inflammatory drugs for 3 weeks after polyp removal.  DIET:  We do recommend a small meal at first, but then you may proceed to your regular diet.  Drink plenty of fluids but you should avoid alcoholic beverages for 24 hours.  ACTIVITY:  You should plan to take it easy for the rest of  today and you should NOT DRIVE or use heavy machinery until tomorrow (because of the sedation medicines used during the test).    FOLLOW UP: Our staff will call the number listed on your records the next business day following your procedure to check on you and address any questions or concerns that you may have regarding the information given to you following your procedure. If we do not reach you, we will leave a message.  However, if you are feeling well and you are not experiencing any problems, there is no need to return our call.  We will assume that you have returned to your regular daily activities without incident.  If any biopsies were taken you will be contacted by phone or by letter within the next 1-3 weeks.  Please call us at [phone] if you have not heard about the biopsies in 3 weeks.   Thank you for allowing [place] to take care of your healthcare today.   SIGNATURES/CONFIDENTIALITY: You and/or your care partner have signed paperwork which will be entered into your electronic medical record.  These signatures attest to the fact that that the information above on your After Visit Summary has been reviewed and is understood.  Full responsibility of the confidentiality of this discharge information lies with you and/or your care-partner.

## 2016-08-26 NOTE — Progress Notes (Signed)
Pt's states no medical or surgical changes since previsit or office visit. 

## 2016-09-02 ENCOUNTER — Encounter: Payer: Self-pay | Admitting: Internal Medicine

## 2017-09-27 ENCOUNTER — Encounter: Payer: Self-pay | Admitting: Internal Medicine

## 2017-11-17 ENCOUNTER — Ambulatory Visit: Payer: Self-pay | Admitting: Internal Medicine

## 2017-11-18 ENCOUNTER — Ambulatory Visit (HOSPITAL_COMMUNITY)
Admission: EM | Admit: 2017-11-18 | Discharge: 2017-11-18 | Disposition: A | Discharge status: Home / Self Care | Payer: BC Managed Care – PPO | Attending: Family Medicine | Admitting: Family Medicine

## 2017-11-18 ENCOUNTER — Encounter (HOSPITAL_COMMUNITY): Payer: Self-pay | Admitting: Emergency Medicine

## 2017-11-18 ENCOUNTER — Other Ambulatory Visit: Payer: Self-pay

## 2017-11-18 DIAGNOSIS — J209 Acute bronchitis, unspecified: Secondary | ICD-10-CM | POA: Diagnosis not present

## 2017-11-18 DIAGNOSIS — F17210 Nicotine dependence, cigarettes, uncomplicated: Secondary | ICD-10-CM | POA: Diagnosis not present

## 2017-11-18 DIAGNOSIS — I10 Essential (primary) hypertension: Secondary | ICD-10-CM

## 2017-11-18 DIAGNOSIS — R0789 Other chest pain: Secondary | ICD-10-CM | POA: Diagnosis not present

## 2017-11-18 LAB — GLUCOSE, CAPILLARY: GLUCOSE-CAPILLARY: 113 mg/dL — AB (ref 65–99)

## 2017-11-18 MED ORDER — AMLODIPINE BESYLATE 5 MG PO TABS: 5.0000 mg | ORAL_TABLET | Freq: Every day | ORAL | 0 refills | Status: DC

## 2017-11-18 MED ORDER — ALBUTEROL SULFATE (2.5 MG/3ML) 0.083% IN NEBU
2.5000 mg | INHALATION_SOLUTION | Freq: Once | RESPIRATORY_TRACT | Status: AC
  Administered 2017-11-18: 2.5 mg via RESPIRATORY_TRACT

## 2017-11-18 MED ORDER — PREDNISONE 20 MG PO TABS: ORAL_TABLET | ORAL | 0 refills | Status: DC

## 2017-11-18 MED ORDER — ALBUTEROL SULFATE (2.5 MG/3ML) 0.083% IN NEBU
INHALATION_SOLUTION | RESPIRATORY_TRACT | Status: AC
  Filled 2017-11-18: qty 3

## 2017-11-18 NOTE — ED Triage Notes (Signed)
Pt c/o chest pain, cough, wheezing since Thursday. Earlier today BP was 200/100.

## 2017-11-18 NOTE — ED Triage Notes (Signed)
Per Dr. Joseph Art, pt okay to be seen here.

## 2017-11-18 NOTE — Discharge Instructions (Addendum)
Follow up with your doctor to recheck the blood pressure and lungs.  Continue to cut back on the cigarettes.

## 2017-11-18 NOTE — ED Provider Notes (Signed)
[facility]   [phone] 11/18/17 Arrival Time: 3295   SUBJECTIVE:  Carolyn Espinoza is a 52 y.o. female who presents to the urgent care with complaint of chest tightness for 5 days along with wheezing.  She's been trying to cut back on cigarettes. She is taking her blood pressure medicine and states she has had wheezing and tightness like this before  Positive F/H of diabetes, hypertension and heart failure.  No N, V, pedal edema.  Symptoms are worse at night.    Past Medical History:  Diagnosis Date  . HTN (hypertension) 2012   Family History  Problem Relation Age of Onset  . Cancer Father   . Hypertension Father   . Heart failure Mother   . Diabetes Mother   . Hypertension Mother   . Kidney disease Mother    Social History   Socioeconomic History  . Marital status: Single    Spouse name: Not on file  . Number of children: Not on file  . Years of education: Not on file  . Highest education level: Not on file  Occupational History  . Not on file  Social Needs  . Financial resource strain: Not on file  . Food insecurity:    Worry: Not on file    Inability: Not on file  . Transportation needs:    Medical: Not on file    Non-medical: Not on file  Tobacco Use  . Smoking status: Current Every Day Smoker    Packs/day: 0.25  . Smokeless tobacco: Never Used  Substance and Sexual Activity  . Alcohol use: Yes    Comment: occasional  . Drug use: No  . Sexual activity: Yes  Lifestyle  . Physical activity:    Days per week: Not on file    Minutes per session: Not on file  . Stress: Not on file  Relationships  . Social connections:    Talks on phone: Not on file    Gets together: Not on file    Attends religious service: Not on file    Active member of club or organization: Not on file    Attends meetings of clubs or organizations: Not on file    Relationship status: Not on file  . Intimate partner violence:    Fear of current or ex partner: Not on  file    Emotionally abused: Not on file    Physically abused: Not on file    Forced sexual activity: Not on file  Other Topics Concern  . Not on file  Social History Narrative   Patient lives alone.   Comes to appt by bus.   Does not exercise regularly.   Smokes cigarettes.   No recreational drug use.   Drinks 1 beer or liquor drink per day.         Current Facility-Administered Medications for the 11/18/17 encounter [facility] Encounter)  Medication  . 0.9 %  sodium chloride infusion   No outpatient medications have been marked as taking for the 11/18/17 encounter [facility] Encounter).   No Known Allergies    ROS: As per HPI, remainder of ROS negative.   OBJECTIVE:   Vitals:   11/18/17 1700  BP: (!) 167/107  Pulse: 80  Resp: 16  Temp: 98.3 F (36.8 C)  SpO2: 100%     General appearance: alert; no distress Eyes: PERRL; EOMI; conjunctiva normal HENT: normocephalic; atraumatic; TM's normal; nasal mucosa normal; oral mucosa normal Neck: supple Lungs: wheezes on auscultation bilaterally Heart: regular  rate and rhythm Back: no CVA tenderness Extremities: no cyanosis or edema; symmetrical with no gross deformities Skin: warm and dry Neurologic: normal gait; grossly normal Psychological: alert and cooperative; normal mood and affect   EKG: shows no change from 2017   Labs:  Results for orders placed or performed during the hospital encounter of 11/02/15  Protime-INR  Result Value Ref Range   Prothrombin Time 13.3 11.6 - 15.2 seconds   INR 0.99 0.00 - 1.49  APTT  Result Value Ref Range   aPTT 27 24 - 37 seconds  CBC  Result Value Ref Range   WBC 9.5 4.0 - 10.5 K/uL   RBC 4.88 3.87 - 5.11 MIL/uL   Hemoglobin 13.4 12.0 - 15.0 g/dL   HCT 40.3 36.0 - 46.0 %   MCV 82.6 78.0 - 100.0 fL   MCH 27.5 26.0 - 34.0 pg   MCHC 33.3 30.0 - 36.0 g/dL   RDW 18.7 (H) 11.5 - 15.5 %   Platelets 287 150 - 400 K/uL  Differential  Result Value Ref Range   Neutrophils  Relative % 58 %   Neutro Abs 5.5 1.7 - 7.7 K/uL   Lymphocytes Relative 36 %   Lymphs Abs 3.4 0.7 - 4.0 K/uL   Monocytes Relative 5 %   Monocytes Absolute 0.5 0.1 - 1.0 K/uL   Eosinophils Relative 1 %   Eosinophils Absolute 0.1 0.0 - 0.7 K/uL   Basophils Relative 0 %   Basophils Absolute 0.0 0.0 - 0.1 K/uL  Comprehensive metabolic panel  Result Value Ref Range   Sodium 136 135 - 145 mmol/L   Potassium 4.1 3.5 - 5.1 mmol/L   Chloride 104 101 - 111 mmol/L   CO2 21 (L) 22 - 32 mmol/L   Glucose, Bld 104 (H) 65 - 99 mg/dL   BUN 11 6 - 20 mg/dL   Creatinine, Ser 0.77 0.44 - 1.00 mg/dL   Calcium 10.2 8.9 - 10.3 mg/dL   Total Protein 8.3 (H) 6.5 - 8.1 g/dL   Albumin 3.8 3.5 - 5.0 g/dL   AST 27 15 - 41 U/L   ALT 22 14 - 54 U/L   Alkaline Phosphatase 61 38 - 126 U/L   Total Bilirubin 0.3 0.3 - 1.2 mg/dL   GFR calc non Af Amer >60 >60 mL/min   GFR calc Af Amer >60 >60 mL/min   Anion gap 11 5 - 15  I-stat troponin, ED (not at [facility])  Result Value Ref Range   Troponin i, poc 0.00 0.00 - 0.08 ng/mL   Comment 3          CBG monitoring, ED  Result Value Ref Range   Glucose-Capillary 88 65 - 99 mg/dL  I-Stat Chem 8, ED  (not at [facility], [facility])  Result Value Ref Range   Sodium 138 135 - 145 mmol/L   Potassium 4.1 3.5 - 5.1 mmol/L   Chloride 103 101 - 111 mmol/L   BUN 14 6 - 20 mg/dL   Creatinine, Ser 0.70 0.44 - 1.00 mg/dL   Glucose, Bld 99 65 - 99 mg/dL   Calcium, Ion 1.24 (H) 1.12 - 1.23 mmol/L   TCO2 22 0 - 100 mmol/L   Hemoglobin 16.3 (H) 12.0 - 15.0 g/dL   HCT 48.0 (H) 36.0 - 46.0 %    Labs Reviewed - No data to display  No results found.     ASSESSMENT & PLAN:  1. Acute bronchitis, unspecified organism   2. Chest  tightness   3. Essential hypertension   Follow up with your doctor to recheck the blood pressure and lungs.  Continue to cut back on the cigarettes.  Meds ordered this encounter  Medications  . amLODipine (NORVASC) 5 MG tablet    Sig: Take 1 tablet (5  mg total) by mouth daily.    Dispense:  30 tablet    Refill:  0  . predniSONE (DELTASONE) 20 MG tablet    Sig: Two daily with food    Dispense:  10 tablet    Refill:  0  Follow up with your doctor to recheck the blood pressure and lungs.  Continue to cut back on the cigarettes.  Reviewed expectations re: course of current medical issues. Questions answered. Outlined signs and symptoms indicating need for more acute intervention. Patient verbalized understanding. After Visit Summary given.      Robyn Haber, MD 11/18/17 1734

## 2017-11-18 NOTE — ED Notes (Signed)
Bed: UCTR Expected date:  Expected time:  Means of arrival:  Comments: 

## 2017-12-19 ENCOUNTER — Ambulatory Visit: Payer: Self-pay | Admitting: Internal Medicine

## 2018-03-27 ENCOUNTER — Ambulatory Visit: Payer: Self-pay | Admitting: Internal Medicine

## 2018-03-31 ENCOUNTER — Ambulatory Visit (INDEPENDENT_AMBULATORY_CARE_PROVIDER_SITE_OTHER): Payer: BC Managed Care – PPO | Admitting: Physician Assistant

## 2018-03-31 ENCOUNTER — Other Ambulatory Visit: Payer: Self-pay

## 2018-03-31 ENCOUNTER — Encounter: Payer: Self-pay | Admitting: Internal Medicine

## 2018-03-31 ENCOUNTER — Encounter (INDEPENDENT_AMBULATORY_CARE_PROVIDER_SITE_OTHER): Payer: Self-pay | Admitting: Physician Assistant

## 2018-03-31 ENCOUNTER — Other Ambulatory Visit (INDEPENDENT_AMBULATORY_CARE_PROVIDER_SITE_OTHER): Payer: Self-pay | Admitting: Physician Assistant

## 2018-03-31 VITALS — BP 148/91 | HR 81 | Temp 98.6°F | Ht 63.0 in | Wt 203.0 lb

## 2018-03-31 DIAGNOSIS — Z1159 Encounter for screening for other viral diseases: Secondary | ICD-10-CM

## 2018-03-31 DIAGNOSIS — F17200 Nicotine dependence, unspecified, uncomplicated: Secondary | ICD-10-CM | POA: Diagnosis not present

## 2018-03-31 DIAGNOSIS — R350 Frequency of micturition: Secondary | ICD-10-CM | POA: Diagnosis not present

## 2018-03-31 DIAGNOSIS — R062 Wheezing: Secondary | ICD-10-CM | POA: Diagnosis not present

## 2018-03-31 DIAGNOSIS — I10 Essential (primary) hypertension: Secondary | ICD-10-CM

## 2018-03-31 DIAGNOSIS — Z23 Encounter for immunization: Secondary | ICD-10-CM

## 2018-03-31 DIAGNOSIS — Z131 Encounter for screening for diabetes mellitus: Secondary | ICD-10-CM

## 2018-03-31 DIAGNOSIS — R232 Flushing: Secondary | ICD-10-CM | POA: Diagnosis not present

## 2018-03-31 DIAGNOSIS — Z1239 Encounter for other screening for malignant neoplasm of breast: Secondary | ICD-10-CM

## 2018-03-31 DIAGNOSIS — Z1231 Encounter for screening mammogram for malignant neoplasm of breast: Secondary | ICD-10-CM

## 2018-03-31 DIAGNOSIS — R12 Heartburn: Secondary | ICD-10-CM

## 2018-03-31 LAB — POCT URINALYSIS DIPSTICK
Bilirubin, UA: NEGATIVE
Blood, UA: NEGATIVE
Glucose, UA: NEGATIVE
KETONES UA: NEGATIVE
Leukocytes, UA: NEGATIVE
NITRITE UA: NEGATIVE
PH UA: 5.5 (ref 5.0–8.0)
PROTEIN UA: POSITIVE — AB
Spec Grav, UA: 1.025 (ref 1.010–1.025)
UROBILINOGEN UA: 0.2 U/dL

## 2018-03-31 LAB — POCT GLYCOSYLATED HEMOGLOBIN (HGB A1C): Hemoglobin A1C: 5.2 % (ref 4.0–5.6)

## 2018-03-31 MED ORDER — PAROXETINE HCL 10 MG PO TABS: 10.0000 mg | ORAL_TABLET | Freq: Every day | ORAL | 2 refills | Status: DC

## 2018-03-31 MED ORDER — LISINOPRIL-HYDROCHLOROTHIAZIDE 20-25 MG PO TABS: 1.0000 | ORAL_TABLET | Freq: Every day | ORAL | 1 refills | Status: DC

## 2018-03-31 MED ORDER — AMLODIPINE BESYLATE 5 MG PO TABS: 5.0000 mg | ORAL_TABLET | Freq: Every day | ORAL | 1 refills | Status: DC

## 2018-03-31 MED ORDER — OMEPRAZOLE 40 MG PO CPDR: 40.0000 mg | DELAYED_RELEASE_CAPSULE | Freq: Every day | ORAL | 2 refills | Status: DC

## 2018-03-31 MED ORDER — VARENICLINE TARTRATE 0.5 MG X 11 & 1 MG X 42 PO MISC: ORAL | 0 refills | Status: DC

## 2018-03-31 MED ORDER — ALBUTEROL SULFATE HFA 108 (90 BASE) MCG/ACT IN AERS: 2.0000 | INHALATION_SPRAY | RESPIRATORY_TRACT | 1 refills | Status: DC | PRN

## 2018-03-31 NOTE — Patient Instructions (Signed)
Heartburn Heartburn is a type of pain or discomfort that can happen in the throat or chest. It is often described as a burning pain. It may also cause a bad taste in the mouth. Heartburn may feel worse when you lie down or bend over, and it is often worse at night. Heartburn may be caused by stomach contents that move back up into the esophagus (reflux). Follow these instructions at home: Take these actions to decrease your discomfort and to help avoid complications. Diet  Follow a diet as recommended by your health care provider. This may involve avoiding foods and drinks such as: ? Coffee and tea (with or without caffeine). ? Drinks that contain alcohol. ? Energy drinks and sports drinks. ? Carbonated drinks or sodas. ? Chocolate and cocoa. ? Peppermint and mint flavorings. ? Garlic and onions. ? Horseradish. ? Spicy and acidic foods, including peppers, chili powder, curry powder, vinegar, hot sauces, and barbecue sauce. ? Citrus fruit juices and citrus fruits, such as oranges, lemons, and limes. ? Tomato-based foods, such as red sauce, chili, salsa, and pizza with red sauce. ? Fried and fatty foods, such as donuts, french fries, potato chips, and high-fat dressings. ? High-fat meats, such as hot dogs and fatty cuts of red and white meats, such as rib eye steak, sausage, ham, and bacon. ? High-fat dairy items, such as whole milk, butter, and cream cheese.  Eat small, frequent meals instead of large meals.  Avoid drinking large amounts of liquid with your meals.  Avoid eating meals during the 2-3 hours before bedtime.  Avoid lying down right after you eat.  Do not exercise right after you eat. General instructions  Pay attention to any changes in your symptoms.  Take over-the-counter and prescription medicines only as told by your health care provider. Do not take aspirin, ibuprofen, or other NSAIDs unless your health care provider told you to do so.  Do not use any tobacco  products, including cigarettes, chewing tobacco, and e-cigarettes. If you need help quitting, ask your health care provider.  Wear loose-fitting clothing. Do not wear anything tight around your waist that causes pressure on your abdomen.  Raise (elevate) the head of your bed about 6 inches (15 cm).  Try to reduce your stress, such as with yoga or meditation. If you need help reducing stress, ask your health care provider.  If you are overweight, reduce your weight to an amount that is healthy for you. Ask your health care provider for guidance about a safe weight loss goal.  Keep all follow-up visits as told by your health care provider. This is important. Contact a health care provider if:  You have new symptoms.  You have unexplained weight loss.  You have difficulty swallowing, or it hurts to swallow.  You have wheezing or a persistent cough.  Your symptoms do not improve with treatment.  You have frequent heartburn for more than two weeks. Get help right away if:  You have pain in your arms, neck, jaw, teeth, or back.  You feel sweaty, dizzy, or light-headed.  You have chest pain or shortness of breath.  You vomit and your vomit looks like blood or coffee grounds.  Your stool is bloody or black. This information is not intended to replace advice given to you by your health care provider. Make sure you discuss any questions you have with your health care provider. Document Released: 11/24/2008 Document Revised: 12/14/2015 Document Reviewed: 11/02/2014 [employer] Interactive Patient Education  2018 [employer]  

## 2018-03-31 NOTE — Progress Notes (Signed)
Subjective:  Patient ID: Carolyn Espinoza, female    DOB: 1966/06/06  Age: 52 y.o. MRN: [id number]  CC: HTN  HPI Carolyn Espinoza is a 52 y.o. female with a medical history of HTN, right foot drop, CVA, and tobacco use disorder presents for management of HTN. BP 148/91 mmHg today. Ran out of anti-hypertensives four months ago.  Endorses epigastric and lower sternal chest pain but only when eating certain foods such as spaghetti and spicy foods.     Smokes half a pack a day. Used to smoke two packs a day. Smoking for approximately 30 years. Had tried nicotine patches before but found no long term relief. Endorses occasional SOB and wheezing. Does not endorse f/c/n/v, unintentional weight loss, or night sweats.     Lastly, pt complains of hot flashes throughout the day. Associated with irritability. Symptoms began around her menopause. Has not taken anything for relief.    Outpatient Medications Prior to Visit  Medication Sig Dispense Refill  . Multiple Vitamin (MULTIVITAMIN WITH MINERALS) TABS tablet Take 1 tablet by mouth daily.    Marland Kitchen amLODipine (NORVASC) 5 MG tablet Take 1 tablet (5 mg total) by mouth daily. (Patient not taking: Reported on 03/31/2018) 30 tablet 0  . lisinopril-hydrochlorothiazide (PRINZIDE,ZESTORETIC) 20-25 MG tablet Take 1 tablet by mouth daily.    . predniSONE (DELTASONE) 20 MG tablet Two daily with food 10 tablet 0   Facility-Administered Medications Prior to Visit  Medication Dose Route Frequency Provider Last Rate Last Dose  . 0.9 %  sodium chloride infusion  500 mL Intravenous Continuous Pyrtle, Lajuan Lines, MD         ROS Review of Systems  Constitutional: Negative for chills, fever and malaise/fatigue.  Eyes: Negative for blurred vision.  Respiratory: Positive for cough (occasionally) and wheezing. Negative for shortness of breath.   Cardiovascular: Negative for chest pain and palpitations.  Gastrointestinal: Positive for abdominal pain (epigastric with spicy foods).  Negative for nausea.  Genitourinary: Negative for dysuria and hematuria.  Musculoskeletal: Negative for joint pain and myalgias.  Skin: Negative for rash.  Neurological: Negative for tingling and headaches.  Psychiatric/Behavioral: Negative for depression. The patient is not nervous/anxious.     Objective:  BP (!) 148/91 (BP Location: Left Arm, Patient Position: Sitting, Cuff Size: Large)   Pulse 81   Temp 98.6 F (37 C) (Oral)   Ht 5\' 3"  (1.6 m)   Wt 203 lb (92.1 kg)   LMP 02/11/2016   SpO2 96%   BMI 35.96 kg/m   BP/Weight 03/31/2018 4/76/5465 0/09/5463  Systolic BP 681 275 170  Diastolic BP 91 017 87  Wt. (Lbs) 203 - 202  BMI 35.96 - 35.78      Physical Exam  Constitutional: She is oriented to person, place, and time.  Well developed, well nourished, NAD, polite  HENT:  Head: Normocephalic and atraumatic.  Eyes: No scleral icterus.  Neck: Normal range of motion. Neck supple. No thyromegaly present.  Cardiovascular: Normal rate, regular rhythm and normal heart sounds.  Pulmonary/Chest: Effort normal and breath sounds normal.  Abdominal: Soft. Bowel sounds are normal. There is no tenderness.  Musculoskeletal: She exhibits no edema.  Neurological: She is alert and oriented to person, place, and time.  Skin: Skin is warm and dry. No rash noted. No erythema. No pallor.  Psychiatric: She has a normal mood and affect. Her behavior is normal. Thought content normal.  Vitals reviewed.    Assessment & Plan:   1. Hypertension, unspecified  type - amLODipine (NORVASC) 5 MG tablet; Take 1 tablet (5 mg total) by mouth daily.  Dispense: 90 tablet; Refill: 1 - lisinopril-hydrochlorothiazide (PRINZIDE,ZESTORETIC) 20-25 MG tablet; Take 1 tablet by mouth daily.  Dispense: 90 tablet; Refill: 1  2. Tobacco use disorder - varenicline (CHANTIX STARTING MONTH PAK) 0.5 MG X 11 & 1 MG X 42 tablet; Take one 0.5 mg tablet by mouth once daily for 3 days, then increase to one 0.5 mg tablet  twice daily for 4 days, then increase to one 1 mg tablet twice daily.  Dispense: 53 tablet; Refill: 0  3. Wheezing - Begin albuterol inhaler   4. Hot flashes - PARoxetine (PAXIL) 10 MG tablet; Take 1 tablet (10 mg total) by mouth daily.  Dispense: 30 tablet; Refill: 2  5. Heartburn - omeprazole (PRILOSEC) 40 MG capsule; Take 1 capsule (40 mg total) by mouth daily.  Dispense: 30 capsule; Refill: 2  6. Screening for diabetes mellitus - HgB A1c 5.2%  7. Need for hepatitis C screening test - Hepatitis c antibody (reflex)  8. Need for prophylactic vaccination and inoculation against influenza - Flu Vaccine QUAD 6+ mos PF IM (Fluarix Quad PF)  9. Screening for breast cancer - MM DIGITAL SCREENING BILATERAL; Future  10. Urinary frequency - Urinalysis Dipstick    Meds ordered this encounter  Medications  . amLODipine (NORVASC) 5 MG tablet    Sig: Take 1 tablet (5 mg total) by mouth daily.    Dispense:  90 tablet    Refill:  1    Order Specific Question:   Supervising Provider    Answer:   Charlott Rakes [[id number]]  . lisinopril-hydrochlorothiazide (PRINZIDE,ZESTORETIC) 20-25 MG tablet    Sig: Take 1 tablet by mouth daily.    Dispense:  90 tablet    Refill:  1    Order Specific Question:   Supervising Provider    Answer:   Charlott Rakes [[id number]]  . varenicline (CHANTIX STARTING MONTH PAK) 0.5 MG X 11 & 1 MG X 42 tablet    Sig: Take one 0.5 mg tablet by mouth once daily for 3 days, then increase to one 0.5 mg tablet twice daily for 4 days, then increase to one 1 mg tablet twice daily.    Dispense:  53 tablet    Refill:  0    Order Specific Question:   Supervising Provider    Answer:   Charlott Rakes [[id number]]  . PARoxetine (PAXIL) 10 MG tablet    Sig: Take 1 tablet (10 mg total) by mouth daily.    Dispense:  30 tablet    Refill:  2    Order Specific Question:   Supervising Provider    Answer:   Charlott Rakes [[id number]]  . omeprazole (PRILOSEC) 40 MG capsule    Sig: Take 1 capsule  (40 mg total) by mouth daily.    Dispense:  30 capsule    Refill:  2    Order Specific Question:   Supervising Provider    Answer:   Charlott Rakes [[id number]]  . albuterol (PROVENTIL HFA;VENTOLIN HFA) 108 (90 Base) MCG/ACT inhaler    Sig: Inhale 2 puffs into the lungs every 4 (four) hours as needed for wheezing or shortness of breath.    Dispense:  1 Inhaler    Refill:  1    Order Specific Question:   Supervising Provider    Answer:   Charlott Rakes [[id number]]    Follow-up: Return in about 4 weeks (around 04/28/2018) for HTN  and tobacco use.   Clent Demark PA

## 2018-04-01 LAB — HCV COMMENT:

## 2018-04-01 LAB — HEPATITIS C ANTIBODY (REFLEX): HCV Ab: <0.1 s/co ratio (ref 0.0–0.9)

## 2018-04-06 ENCOUNTER — Telehealth (INDEPENDENT_AMBULATORY_CARE_PROVIDER_SITE_OTHER): Payer: Self-pay

## 2018-04-06 NOTE — Telephone Encounter (Signed)
-----   Message from Clent Demark, PA-C sent at 04/01/2018  6:27 PM EDT ----- Negative for HCV.

## 2018-04-06 NOTE — Telephone Encounter (Signed)
Left message notifying patient that HCV is negative and to call [facility] with any questions or concerns. Nat Christen, CMA

## 2018-04-28 ENCOUNTER — Other Ambulatory Visit: Payer: Self-pay

## 2018-04-28 ENCOUNTER — Encounter (INDEPENDENT_AMBULATORY_CARE_PROVIDER_SITE_OTHER): Payer: Self-pay | Admitting: Physician Assistant

## 2018-04-28 ENCOUNTER — Ambulatory Visit (INDEPENDENT_AMBULATORY_CARE_PROVIDER_SITE_OTHER): Payer: BC Managed Care – PPO | Admitting: Physician Assistant

## 2018-04-28 VITALS — BP 145/86 | HR 72 | Temp 98.0°F | Ht 63.0 in | Wt 207.2 lb

## 2018-04-28 DIAGNOSIS — F17200 Nicotine dependence, unspecified, uncomplicated: Secondary | ICD-10-CM | POA: Diagnosis not present

## 2018-04-28 DIAGNOSIS — I10 Essential (primary) hypertension: Secondary | ICD-10-CM

## 2018-04-28 MED ORDER — LISINOPRIL-HYDROCHLOROTHIAZIDE 20-12.5 MG PO TABS: 2.0000 | ORAL_TABLET | Freq: Every day | ORAL | 1 refills | Status: DC

## 2018-04-28 MED ORDER — VARENICLINE TARTRATE 1 MG PO TABS
1.0000 mg | ORAL_TABLET | Freq: Two times a day (BID) | ORAL | 2 refills | Status: DC
Start: 2018-04-28 — End: 2019-07-08

## 2018-04-28 MED ORDER — AMLODIPINE BESYLATE 10 MG PO TABS: 10.0000 mg | ORAL_TABLET | Freq: Every day | ORAL | 1 refills | Status: DC

## 2018-04-28 MED ORDER — VARENICLINE TARTRATE 0.5 MG X 11 & 1 MG X 42 PO MISC: ORAL | 0 refills | Status: DC

## 2018-04-28 NOTE — Patient Instructions (Signed)

## 2018-04-28 NOTE — Progress Notes (Signed)
Subjective:  Patient ID: Carolyn Espinoza, female    DOB: 1965-09-12  Age: 52 y.o. MRN: [id number]  CC: f/u HTN and tobacco use   HPI Carolyn Espinoza is a 52 y.o. female with a medical history of HTN, right foot drop, CVA, and tobacco use disorder presents for f/u of HTN and tobacco use. Last BP 148/91 mmHg one month ago. 145/86 mmHg today. Says she takes Prinzide and Amlodipine at night. She forgets to take medication at times, especially during her "socialization" during the weekends. Does not endorse CP, palpitations, SOB, HA, abdominal pain, f/c/n/v, swelling, rash, or GI/GU sxs.     Smoking half a pack a day last month. Cut down to six cigarettes a day through [employer], avoiding smoke breaks at work, and avoiding social situations which may lead to smoking. Forgot to pick up Chantix last month but says she will pick up today.   Outpatient Medications Prior to Visit  Medication Sig Dispense Refill  . albuterol (PROVENTIL HFA;VENTOLIN HFA) 108 (90 Base) MCG/ACT inhaler Inhale 2 puffs into the lungs every 4 (four) hours as needed for wheezing or shortness of breath. 1 Inhaler 1  . amLODipine (NORVASC) 5 MG tablet Take 1 tablet (5 mg total) by mouth daily. 90 tablet 1  . lisinopril-hydrochlorothiazide (PRINZIDE,ZESTORETIC) 20-25 MG tablet Take 1 tablet by mouth daily. 90 tablet 1  . Multiple Vitamin (MULTIVITAMIN WITH MINERALS) TABS tablet Take 1 tablet by mouth daily.    Marland Kitchen omeprazole (PRILOSEC) 40 MG capsule Take 1 capsule (40 mg total) by mouth daily. 30 capsule 2  . PARoxetine (PAXIL) 10 MG tablet Take 1 tablet (10 mg total) by mouth daily. 30 tablet 2  . varenicline (CHANTIX STARTING MONTH PAK) 0.5 MG X 11 & 1 MG X 42 tablet Take one 0.5 mg tablet by mouth once daily for 3 days, then increase to one 0.5 mg tablet twice daily for 4 days, then increase to one 1 mg tablet twice daily. (Patient not taking: Reported on 04/28/2018) 53 tablet 0   No facility-administered medications prior to visit.       ROS Review of Systems  Constitutional: Negative for chills, fever and malaise/fatigue.  Eyes: Negative for blurred vision.  Respiratory: Negative for shortness of breath.   Cardiovascular: Negative for chest pain and palpitations.  Gastrointestinal: Negative for abdominal pain and nausea.  Genitourinary: Negative for dysuria and hematuria.  Musculoskeletal: Negative for joint pain and myalgias.  Skin: Negative for rash.  Neurological: Negative for tingling and headaches.  Psychiatric/Behavioral: Negative for depression. The patient is not nervous/anxious.     Objective:  BP (!) 145/86 (BP Location: Left Arm, Patient Position: Sitting, Cuff Size: Large)   Pulse 72   Temp 98 F (36.7 C) (Oral)   Ht 5\' 3"  (1.6 m)   Wt 207 lb 3.2 oz (94 kg)   LMP 02/11/2016   SpO2 93%   BMI 36.70 kg/m   BP/Weight 04/28/2018 03/31/2018 7/82/9562  Systolic BP 130 865 784  Diastolic BP 86 91 696  Wt. (Lbs) 207.2 203 -  BMI 36.7 35.96 -      Physical Exam  Constitutional: She is oriented to person, place, and time.  Well developed, well nourished, NAD, polite  HENT:  Head: Normocephalic and atraumatic.  Eyes: No scleral icterus.  Neck: Normal range of motion. Neck supple. No thyromegaly present.  Cardiovascular: Normal rate, regular rhythm and normal heart sounds.  Pulmonary/Chest: Effort normal and breath sounds normal.  Abdominal: Soft. Bowel  sounds are normal. There is no tenderness.  Musculoskeletal: She exhibits no edema.  Neurological: She is alert and oriented to person, place, and time.  Skin: Skin is warm and dry. No rash noted. No erythema. No pallor.  Psychiatric: She has a normal mood and affect. Her behavior is normal. Thought content normal.  Vitals reviewed.    Assessment & Plan:    1. Hypertension, unspecified type - Increase amLODipine (NORVASC) 10 MG tablet; Take 1 tablet (10 mg total) by mouth daily.  Dispense: 90 tablet; Refill: 1 - Increase  lisinopril-hydrochlorothiazide (ZESTORETIC) 20-12.5 MG tablet; Take 2 tablets by mouth daily.  Dispense: 180 tablet; Refill: 1  2. Tobacco use disorder - Begin varenicline (CHANTIX STARTING MONTH PAK) 0.5 MG X 11 & 1 MG X 42 tablet; Take one 0.5 mg tablet by mouth once daily for 3 days, then increase to one 0.5 mg tablet twice daily for 4 days, then increase to one 1 mg tablet twice daily.  Dispense: 53 tablet; Refill: 0 - Begin after completing starting month pack. Varenicline (CHANTIX CONTINUING MONTH PAK) 1 MG tablet; Take 1 tablet (1 mg total) by mouth 2 (two) times daily.  Dispense: 60 tablet; Refill: 2   Meds ordered this encounter  Medications  . varenicline (CHANTIX STARTING MONTH PAK) 0.5 MG X 11 & 1 MG X 42 tablet    Sig: Take one 0.5 mg tablet by mouth once daily for 3 days, then increase to one 0.5 mg tablet twice daily for 4 days, then increase to one 1 mg tablet twice daily.    Dispense:  53 tablet    Refill:  0    Order Specific Question:   Supervising Provider    Answer:   Charlott Rakes [[id number]]  . amLODipine (NORVASC) 10 MG tablet    Sig: Take 1 tablet (10 mg total) by mouth daily.    Dispense:  90 tablet    Refill:  1    Order Specific Question:   Supervising Provider    Answer:   Charlott Rakes [[id number]]  . lisinopril-hydrochlorothiazide (ZESTORETIC) 20-12.5 MG tablet    Sig: Take 2 tablets by mouth daily.    Dispense:  180 tablet    Refill:  1    Order Specific Question:   Supervising Provider    Answer:   Charlott Rakes [[id number]]  . varenicline (CHANTIX CONTINUING MONTH PAK) 1 MG tablet    Sig: Take 1 tablet (1 mg total) by mouth 2 (two) times daily.    Dispense:  60 tablet    Refill:  2    Order Specific Question:   Supervising Provider    Answer:   Charlott Rakes [[id number]]    Follow-up: Return in about 4 weeks (around 05/26/2018) for HTN and smoking.   Clent Demark PA

## 2018-04-29 ENCOUNTER — Ambulatory Visit
Admission: RE | Admit: 2018-04-29 | Discharge: 2018-04-29 | Disposition: A | Discharge status: Home / Self Care | Payer: BC Managed Care – PPO | Source: Ambulatory Visit | Attending: Physician Assistant | Admitting: Physician Assistant

## 2018-04-29 DIAGNOSIS — Z1231 Encounter for screening mammogram for malignant neoplasm of breast: Secondary | ICD-10-CM

## 2018-05-26 ENCOUNTER — Ambulatory Visit (INDEPENDENT_AMBULATORY_CARE_PROVIDER_SITE_OTHER): Payer: BC Managed Care – PPO | Admitting: Physician Assistant

## 2018-08-04 ENCOUNTER — Ambulatory Visit (INDEPENDENT_AMBULATORY_CARE_PROVIDER_SITE_OTHER): Payer: BC Managed Care – PPO | Admitting: Family Medicine

## 2018-08-25 ENCOUNTER — Ambulatory Visit (INDEPENDENT_AMBULATORY_CARE_PROVIDER_SITE_OTHER): Payer: BC Managed Care – PPO | Admitting: Family Medicine

## 2019-05-03 ENCOUNTER — Ambulatory Visit (INDEPENDENT_AMBULATORY_CARE_PROVIDER_SITE_OTHER): Payer: BC Managed Care – PPO | Admitting: Primary Care

## 2019-05-03 ENCOUNTER — Encounter (INDEPENDENT_AMBULATORY_CARE_PROVIDER_SITE_OTHER): Payer: Self-pay | Admitting: Primary Care

## 2019-05-03 ENCOUNTER — Other Ambulatory Visit: Payer: Self-pay

## 2019-05-03 VITALS — BP 149/95 | HR 83 | Temp 97.3°F | Ht 63.0 in | Wt 196.4 lb

## 2019-05-03 DIAGNOSIS — Z23 Encounter for immunization: Secondary | ICD-10-CM

## 2019-05-03 DIAGNOSIS — L2489 Irritant contact dermatitis due to other agents: Secondary | ICD-10-CM | POA: Diagnosis not present

## 2019-05-03 DIAGNOSIS — B358 Other dermatophytoses: Secondary | ICD-10-CM

## 2019-05-03 DIAGNOSIS — I10 Essential (primary) hypertension: Secondary | ICD-10-CM | POA: Diagnosis not present

## 2019-05-03 DIAGNOSIS — R21 Rash and other nonspecific skin eruption: Secondary | ICD-10-CM

## 2019-05-03 DIAGNOSIS — B359 Dermatophytosis, unspecified: Secondary | ICD-10-CM

## 2019-05-03 DIAGNOSIS — F17200 Nicotine dependence, unspecified, uncomplicated: Secondary | ICD-10-CM

## 2019-05-03 DIAGNOSIS — L249 Irritant contact dermatitis, unspecified cause: Secondary | ICD-10-CM

## 2019-05-03 DIAGNOSIS — R062 Wheezing: Secondary | ICD-10-CM

## 2019-05-03 MED ORDER — LISINOPRIL-HYDROCHLOROTHIAZIDE 20-12.5 MG PO TABS: 2.0000 | ORAL_TABLET | Freq: Every day | ORAL | 0 refills | Status: DC

## 2019-05-03 MED ORDER — ALBUTEROL SULFATE HFA 108 (90 BASE) MCG/ACT IN AERS: 2.0000 | INHALATION_SPRAY | RESPIRATORY_TRACT | 0 refills | Status: DC | PRN

## 2019-05-03 MED ORDER — BLOOD PRESSURE KIT: 1.0000 | PACK | Freq: Three times a day (TID) | 0 refills | Status: DC

## 2019-05-03 MED ORDER — AMLODIPINE BESYLATE 10 MG PO TABS: 10.0000 mg | ORAL_TABLET | Freq: Every day | ORAL | 0 refills | Status: DC

## 2019-05-03 MED ORDER — TRIAMCINOLONE ACETONIDE 0.1 % EX CREA: 1.0000 "application " | TOPICAL_CREAM | Freq: Two times a day (BID) | CUTANEOUS | 0 refills | Status: DC

## 2019-05-03 NOTE — Patient Instructions (Signed)

## 2019-05-03 NOTE — Progress Notes (Signed)
Established Patient Office Visit  Subjective:  Patient ID: Carolyn Espinoza, female    DOB: November 11, 1965  Age: 53 y.o. MRN: [id number]  CC:  Chief Complaint  Patient presents with  . Rash    underarms    HPI Carolyn Espinoza presents for today to establish care with new provider but is an establish patient with [facility]. Her concern is a rash under both arm pits right greater than left. Blood pressures is elevated 149/95 and she is out of medications.  She denies shortness of breath, headaches, chest pain or lower extremity edema   Past Medical History:  Diagnosis Date  . HTN (hypertension) 2012    Past Surgical History:  Procedure Laterality Date  . DENTAL SURGERY  2003   multiple teeth removed    Family History  Problem Relation Age of Onset  . Cancer Father   . Hypertension Father   . Heart failure Mother   . Diabetes Mother   . Hypertension Mother   . Kidney disease Mother     Social History   Socioeconomic History  . Marital status: Single    Spouse name: Not on file  . Number of children: Not on file  . Years of education: Not on file  . Highest education level: Not on file  Occupational History  . Not on file  Social Needs  . Financial resource strain: Not on file  . Food insecurity    Worry: Not on file    Inability: Not on file  . Transportation needs    Medical: Not on file    Non-medical: Not on file  Tobacco Use  . Smoking status: Current Every Day Smoker    Packs/day: 0.25  . Smokeless tobacco: Never Used  Substance and Sexual Activity  . Alcohol use: Yes    Comment: occasional  . Drug use: No  . Sexual activity: Yes  Lifestyle  . Physical activity    Days per week: Not on file    Minutes per session: Not on file  . Stress: Not on file  Relationships  . Social Herbalist on phone: Not on file    Gets together: Not on file    Attends religious service: Not on file    Active member of club or organization: Not on file    Attends  meetings of clubs or organizations: Not on file    Relationship status: Not on file  . Intimate partner violence    Fear of current or ex partner: Not on file    Emotionally abused: Not on file    Physically abused: Not on file    Forced sexual activity: Not on file  Other Topics Concern  . Not on file  Social History Narrative   Patient lives alone.   Comes to appt by bus.   Does not exercise regularly.   Smokes cigarettes.   No recreational drug use.   Drinks 1 beer or liquor drink per day.          Outpatient Medications Prior to Visit  Medication Sig Dispense Refill  . amLODipine (NORVASC) 10 MG tablet Take 1 tablet (10 mg total) by mouth daily. 90 tablet 1  . lisinopril-hydrochlorothiazide (ZESTORETIC) 20-12.5 MG tablet Take 2 tablets by mouth daily. 180 tablet 1  . Multiple Vitamin (MULTIVITAMIN WITH MINERALS) TABS tablet Take 1 tablet by mouth daily.    Marland Kitchen omeprazole (PRILOSEC) 40 MG capsule Take 1 capsule (40 mg total) by mouth  daily. (Patient not taking: Reported on 05/03/2019) 30 capsule 2  . PARoxetine (PAXIL) 10 MG tablet Take 1 tablet (10 mg total) by mouth daily. (Patient not taking: Reported on 05/03/2019) 30 tablet 2  . varenicline (CHANTIX CONTINUING MONTH PAK) 1 MG tablet Take 1 tablet (1 mg total) by mouth 2 (two) times daily. (Patient not taking: Reported on 05/03/2019) 60 tablet 2  . varenicline (CHANTIX STARTING MONTH PAK) 0.5 MG X 11 & 1 MG X 42 tablet Take one 0.5 mg tablet by mouth once daily for 3 days, then increase to one 0.5 mg tablet twice daily for 4 days, then increase to one 1 mg tablet twice daily. (Patient not taking: Reported on 05/03/2019) 53 tablet 0  . albuterol (PROVENTIL HFA;VENTOLIN HFA) 108 (90 Base) MCG/ACT inhaler Inhale 2 puffs into the lungs every 4 (four) hours as needed for wheezing or shortness of breath. 1 Inhaler 1   No facility-administered medications prior to visit.     No Known Allergies  ROS Review of Systems  Respiratory:  Positive for cough.   Skin: Positive for rash.  All other systems reviewed and are negative.     Objective:    Physical Exam  Constitutional: She is oriented to person, place, and time. She appears well-developed and well-nourished.  Obese BMI 34  HENT:  Head: Normocephalic and atraumatic.  Eyes: Pupils are equal, round, and reactive to light. EOM are normal.  Neck: Normal range of motion. Neck supple.  Cardiovascular: Normal rate and regular rhythm.  Pulmonary/Chest: Effort normal and breath sounds normal.  Abdominal: Soft. Bowel sounds are normal.  Musculoskeletal: Normal range of motion.  Neurological: She is alert and oriented to person, place, and time.  Skin: Skin is warm.  Psychiatric: She has a normal mood and affect.    BP (!) 149/95 (BP Location: Right Arm, Patient Position: Sitting, Cuff Size: Large)   Pulse 83   Temp (!) 97.3 F (36.3 C) (Temporal)   Ht 5' 3"  (1.6 m)   Wt 196 lb 6.4 oz (89.1 kg)   LMP 02/11/2016   SpO2 96%   BMI 34.79 kg/m  Wt Readings from Last 3 Encounters:  05/03/19 196 lb 6.4 oz (89.1 kg)  04/28/18 207 lb 3.2 oz (94 kg)  03/31/18 203 lb (92.1 kg)     Health Maintenance Due  Topic Date Due  . PAP SMEAR-Modifier  05/29/1985  . TETANUS/TDAP  02/01/2019  . INFLUENZA VACCINE  02/20/2019    There are no preventive care reminders to display for this patient.  No results found for: TSH Lab Results  Component Value Date   WBC 9.5 11/01/2015   HGB 16.3 (H) 11/01/2015   HCT 48.0 (H) 11/01/2015   MCV 82.6 11/01/2015   PLT 287 11/01/2015   Lab Results  Component Value Date   NA 138 11/01/2015   K 4.1 11/01/2015   CO2 21 (L) 11/01/2015   GLUCOSE 99 11/01/2015   BUN 14 11/01/2015   CREATININE 0.70 11/01/2015   BILITOT 0.3 11/01/2015   ALKPHOS 61 11/01/2015   AST 27 11/01/2015   ALT 22 11/01/2015   PROT 8.3 (H) 11/01/2015   ALBUMIN 3.8 11/01/2015   CALCIUM 10.2 11/01/2015   ANIONGAP 11 11/01/2015   Lab Results  Component  Value Date   CHOL 141 02/24/2009   Lab Results  Component Value Date   HDL 43 02/24/2009   Lab Results  Component Value Date   LDLCALC 69 02/24/2009   Lab Results  Component Value Date   TRIG 143 02/24/2009   Lab Results  Component Value Date   CHOLHDL 3.3 Ratio 02/24/2009   Lab Results  Component Value Date   HGBA1C 5.2 03/31/2018      Assessment & Plan:  Makenize was seen today for rash.  Diagnoses and all orders for this visit:  Hypertension, unspecified type Not at goal today Bp as 149/95 out of medications refilled amLODipine (NORVASC) 10 MG tablet; Take 1 tablet (10 mg total) by mouth daily. -     lisinopril-hydrochlorothiazide (ZESTORETIC) 20-12.5 MG tablet; Take 2 tablets by mouth daily. Ordered Bp kit to check blood pressures at home.  -     Blood Pressure KIT; 1 Bag by Does not apply route 3 (three) times daily. -     -     CBC with Differential/Platelet; Future -     CMP14+EGFR; Future -     Lipid panel; Future  Tobacco use disorder Also can contribute to elevated Bp each visit discuss cessation.  Wheezing She has a diagnosis asthma prescribe SABA for rescue inhaler. Smoking can also exacerbate asthmas   Irritant contact dermatitis, unspecified trigger .Conservative treatment luke warm baths, Aveeno oatmeal bath or emollients. Causes may be contributed to changes in soaps, deodorants, laundry detergent or environmental- grass, weeds and trees.  Need for immunization against influenza -     Flu Vaccine QUAD 36+ mos IM  Other orders -     albuterol (VENTOLIN HFA) 108 (90 Base) MCG/ACT inhaler; Inhale 2 puffs into the lungs every 4 (four) hours as needed for wheezing or shortness of breath. -     triamcinolone cream (KENALOG) 0.1 %; Apply 1 application topically 2 (two) times daily.   Meds ordered this encounter  Medications  . Blood Pressure KIT    Sig: 1 Bag by Does not apply route 3 (three) times daily.    Dispense:  1 kit    Refill:  0  .  amLODipine (NORVASC) 10 MG tablet    Sig: Take 1 tablet (10 mg total) by mouth daily.    Dispense:  90 tablet    Refill:  0  . albuterol (VENTOLIN HFA) 108 (90 Base) MCG/ACT inhaler    Sig: Inhale 2 puffs into the lungs every 4 (four) hours as needed for wheezing or shortness of breath.    Dispense:  6.7 g    Refill:  0  . lisinopril-hydrochlorothiazide (ZESTORETIC) 20-12.5 MG tablet    Sig: Take 2 tablets by mouth daily.    Dispense:  180 tablet    Refill:  0  . triamcinolone cream (KENALOG) 0.1 %    Sig: Apply 1 application topically 2 (two) times daily.    Dispense:  60 g    Refill:  0    Follow-up: Return in about 7 weeks (around 06/21/2019) for Bp re check . in person. Schedule fasting labs.    Kerin Perna, NP

## 2019-05-03 NOTE — Progress Notes (Signed)
196.4  

## 2019-05-10 ENCOUNTER — Other Ambulatory Visit (INDEPENDENT_AMBULATORY_CARE_PROVIDER_SITE_OTHER): Payer: BC Managed Care – PPO

## 2019-05-10 ENCOUNTER — Other Ambulatory Visit: Payer: Self-pay

## 2019-05-10 ENCOUNTER — Other Ambulatory Visit (INDEPENDENT_AMBULATORY_CARE_PROVIDER_SITE_OTHER): Payer: Self-pay | Admitting: Primary Care

## 2019-05-10 DIAGNOSIS — I10 Essential (primary) hypertension: Secondary | ICD-10-CM

## 2019-05-11 ENCOUNTER — Telehealth (INDEPENDENT_AMBULATORY_CARE_PROVIDER_SITE_OTHER): Payer: Self-pay

## 2019-05-11 LAB — LIPID PANEL
Chol/HDL Ratio: 3.2 ratio (ref 0.0–4.4)
Cholesterol, Total: 167 mg/dL (ref 100–199)
HDL: 52 mg/dL (ref >39)
LDL Chol Calc (NIH): 96 mg/dL (ref 0–99)
Triglycerides: 105 mg/dL (ref 0–149)
VLDL Cholesterol Cal: 19 mg/dL (ref 5–40)

## 2019-05-11 LAB — CBC WITH DIFFERENTIAL/PLATELET
Basophils Absolute: 0 10*3/uL (ref 0.0–0.2)
Basos: 1 %
EOS (ABSOLUTE): 0 10*3/uL (ref 0.0–0.4)
Eos: 1 %
Hematocrit: 50.1 % — ABNORMAL HIGH (ref 34.0–46.6)
Hemoglobin: 17.1 g/dL — ABNORMAL HIGH (ref 11.1–15.9)
Immature Grans (Abs): 0 10*3/uL (ref 0.0–0.1)
Immature Granulocytes: 0 %
Lymphocytes Absolute: 1.9 10*3/uL (ref 0.7–3.1)
Lymphs: 47 %
MCH: 30.5 pg (ref 26.6–33.0)
MCHC: 34.1 g/dL (ref 31.5–35.7)
MCV: 90 fL (ref 79–97)
Monocytes Absolute: 0.3 10*3/uL (ref 0.1–0.9)
Monocytes: 8 %
Neutrophils Absolute: 1.7 10*3/uL (ref 1.4–7.0)
Neutrophils: 43 %
Platelets: 217 10*3/uL (ref 150–450)
RBC: 5.6 x10E6/uL — ABNORMAL HIGH (ref 3.77–5.28)
RDW: 12.7 % (ref 11.7–15.4)
WBC: 4.1 10*3/uL (ref 3.4–10.8)

## 2019-05-11 LAB — CMP14+EGFR
ALT: 27 IU/L (ref 0–32)
AST: 26 IU/L (ref 0–40)
Albumin/Globulin Ratio: 0.8 — ABNORMAL LOW (ref 1.2–2.2)
Albumin: 4.1 g/dL (ref 3.8–4.9)
Alkaline Phosphatase: 90 IU/L (ref 39–117)
BUN/Creatinine Ratio: 11 (ref 9–23)
BUN: 9 mg/dL (ref 6–24)
Bilirubin Total: 0.5 mg/dL (ref 0.0–1.2)
CO2: 23 mmol/L (ref 20–29)
Calcium: 9.6 mg/dL (ref 8.7–10.2)
Chloride: 99 mmol/L (ref 96–106)
Creatinine, Ser: 0.85 mg/dL (ref 0.57–1.00)
GFR calc Af Amer: 90 mL/min/{1.73_m2} (ref >59)
GFR calc non Af Amer: 78 mL/min/{1.73_m2} (ref >59)
Globulin, Total: 4.9 g/dL — ABNORMAL HIGH (ref 1.5–4.5)
Glucose: 72 mg/dL (ref 65–99)
Potassium: 4.5 mmol/L (ref 3.5–5.2)
Sodium: 136 mmol/L (ref 134–144)
Total Protein: 9 g/dL — ABNORMAL HIGH (ref 6.0–8.5)

## 2019-05-11 NOTE — Telephone Encounter (Signed)
-----   Message from Kerin Perna, NP sent at 05/11/2019  8:41 AM EDT ----- I have reviewed all labs and they are normal except slightly elevated protein. If on protein supplement stop. If not will monitor labs

## 2019-05-11 NOTE — Telephone Encounter (Signed)
Patient is aware that labs are normal except slightly elevated protein. She was on a protein supplement. Advised patient to stop taking and we will monitor levels over time. Carolyn Espinoza, CMA

## 2019-05-26 ENCOUNTER — Ambulatory Visit (INDEPENDENT_AMBULATORY_CARE_PROVIDER_SITE_OTHER): Payer: BC Managed Care – PPO | Admitting: Primary Care

## 2019-05-26 ENCOUNTER — Encounter: Payer: Self-pay | Admitting: Internal Medicine

## 2019-06-22 ENCOUNTER — Ambulatory Visit (INDEPENDENT_AMBULATORY_CARE_PROVIDER_SITE_OTHER): Payer: BC Managed Care – PPO | Admitting: Primary Care

## 2019-07-02 ENCOUNTER — Ambulatory Visit (AMBULATORY_SURGERY_CENTER): Payer: BC Managed Care – PPO | Admitting: *Deleted

## 2019-07-02 ENCOUNTER — Other Ambulatory Visit: Payer: Self-pay

## 2019-07-02 ENCOUNTER — Encounter: Payer: Self-pay | Admitting: Internal Medicine

## 2019-07-02 VITALS — Temp 97.1°F | Ht 63.0 in | Wt 195.0 lb

## 2019-07-02 DIAGNOSIS — Z1159 Encounter for screening for other viral diseases: Secondary | ICD-10-CM

## 2019-07-02 DIAGNOSIS — Z86010 Personal history of colonic polyps: Secondary | ICD-10-CM

## 2019-07-02 MED ORDER — SUPREP BOWEL PREP KIT 17.5-3.13-1.6 GM/177ML PO SOLN: 1.0000 | Freq: Once | ORAL | 0 refills | Status: AC

## 2019-07-02 NOTE — Progress Notes (Signed)
No egg or soy allergy known to patient  No issues with past sedation with any surgeries  or procedures, no past intubation  No diet pills per patient No home 02 use per patient  No blood thinners per patient  Pt denies issues with constipation  No A fib or A flutter  EMMI video sent to pt's e mail   Due to the COVID-19 pandemic we are asking patients to follow these guidelines. Please only bring one care partner. Please be aware that your care partner may wait in the car in the parking lot or if they feel like they will be too hot to wait in the car, they may wait in the lobby on the 4th floor. All care partners are required to wear a mask the entire time (we do not have any that we can provide them), they need to practice social distancing, and we will do a Covid check for all patient's and care partners when you arrive. Also we will check their temperature and your temperature. If the care partner waits in their car they need to stay in the parking lot the entire time and we will call them on their cell phone when the patient is ready for discharge so they can bring the car to the front of the building. Also all patient's will need to wear a mask into building.  suprep $15 coupon

## 2019-07-05 ENCOUNTER — Encounter: Payer: BC Managed Care – PPO | Admitting: Internal Medicine

## 2019-07-08 ENCOUNTER — Encounter (INDEPENDENT_AMBULATORY_CARE_PROVIDER_SITE_OTHER): Payer: Self-pay | Admitting: Primary Care

## 2019-07-08 ENCOUNTER — Ambulatory Visit (INDEPENDENT_AMBULATORY_CARE_PROVIDER_SITE_OTHER): Payer: BC Managed Care – PPO | Admitting: Primary Care

## 2019-07-08 ENCOUNTER — Ambulatory Visit (INDEPENDENT_AMBULATORY_CARE_PROVIDER_SITE_OTHER): Payer: BC Managed Care – PPO

## 2019-07-08 ENCOUNTER — Other Ambulatory Visit: Payer: Self-pay

## 2019-07-08 DIAGNOSIS — I10 Essential (primary) hypertension: Secondary | ICD-10-CM | POA: Diagnosis not present

## 2019-07-08 DIAGNOSIS — Z1159 Encounter for screening for other viral diseases: Secondary | ICD-10-CM

## 2019-07-08 LAB — SARS CORONAVIRUS 2 (TAT 6-24 HRS): SARS Coronavirus 2: NEGATIVE

## 2019-07-08 MED ORDER — LISINOPRIL-HYDROCHLOROTHIAZIDE 20-12.5 MG PO TABS: 2.0000 | ORAL_TABLET | Freq: Every day | ORAL | 1 refills | Status: DC

## 2019-07-08 MED ORDER — AMLODIPINE BESYLATE 10 MG PO TABS: 10.0000 mg | ORAL_TABLET | Freq: Every day | ORAL | 1 refills | Status: DC

## 2019-07-08 NOTE — Progress Notes (Signed)
Virtual Visit via Telephone Note  I connected with Carolyn Espinoza on 07/08/19 at  3:10 PM EST by telephone and verified that I am speaking with the correct person using two identifiers.   I discussed the limitations, risks, security and privacy concerns of performing an evaluation and management service by telephone and the availability of in person appointments. I also discussed with the patient that there may be a patient responsible charge related to this service. The patient expressed understanding and agreed to proceed.   History of Present Illness: Carolyn Espinoza is having a tele visit for blood pressure follow-up .Patient was out blood pressure medication with elevated readings 145/95.    Blood pressure medication was refilled amlodipine 10 mg and Zestril 20/12.5 .  She also agreed to purchase a blood pressure monitor. Bp systolic 284-132 diastolic 44-010 lowest/highest.  Unable to discern when blood pressure was elevated was it associated with activity or food.   Past Medical History:  Diagnosis Date  . HTN (hypertension) 2012   Current Outpatient Medications on File Prior to Visit  Medication Sig Dispense Refill  . Blood Pressure KIT 1 Bag by Does not apply route 3 (three) times daily. 1 kit 0  . triamcinolone cream (KENALOG) 0.1 % Apply 1 application topically 2 (two) times daily. 60 g 0  . albuterol (VENTOLIN HFA) 108 (90 Base) MCG/ACT inhaler Inhale 2 puffs into the lungs every 4 (four) hours as needed for wheezing or shortness of breath. 6.7 g 0  . Multiple Vitamin (MULTIVITAMIN WITH MINERALS) TABS tablet Take 1 tablet by mouth daily.     No current facility-administered medications on file prior to visit.   Observations/Objective: Review of Systems  All other systems reviewed and are negative.  Assessment and Plan: Carolyn Espinoza was seen today for blood pressure check.  Diagnoses and all orders for this visit:  Hypertension, unspecified type Counseled on blood pressure goal of less  than 130/80, low-sodium to include frozen meals, pork, using herbs for seasoning and reading labels on packaging DASH  medication compliance, 150 minutes of moderate intensity exercise per week. Discussed medication compliance, adverse effects.  Increase Zestoretic 20/12.5 twice daily reevaluate dosage change in blood pressure in approximately 2 months. -     lisinopril-hydrochlorothiazide (ZESTORETIC) 20-12.5 MG tablet; Take 2 tablets by mouth daily. -     amLODipine (NORVASC) 10 MG tablet; Take 1 tablet (10 mg total) by mouth daily.    Follow Up Instructions:    I discussed the assessment and treatment plan with the patient. The patient was provided an opportunity to ask questions and all were answered. The patient agreed with the plan and demonstrated an understanding of the instructions.   The patient was advised to call back or seek an in-person evaluation if the symptoms worsen or if the condition fails to improve as anticipated.  I provided 11 minutes of non-face-to-face time during this encounter.   Kerin Perna, NP

## 2019-07-08 NOTE — Progress Notes (Signed)
BP 149/128 left arm after eating

## 2019-07-12 ENCOUNTER — Other Ambulatory Visit: Payer: Self-pay

## 2019-07-12 ENCOUNTER — Ambulatory Visit (AMBULATORY_SURGERY_CENTER): Payer: BC Managed Care – PPO | Admitting: Internal Medicine

## 2019-07-12 ENCOUNTER — Encounter: Payer: Self-pay | Admitting: Internal Medicine

## 2019-07-12 VITALS — BP 117/81 | HR 67 | Temp 98.9°F | Resp 13 | Ht 63.0 in | Wt 195.0 lb

## 2019-07-12 DIAGNOSIS — D125 Benign neoplasm of sigmoid colon: Secondary | ICD-10-CM

## 2019-07-12 DIAGNOSIS — D123 Benign neoplasm of transverse colon: Secondary | ICD-10-CM

## 2019-07-12 DIAGNOSIS — Z86010 Personal history of colonic polyps: Secondary | ICD-10-CM

## 2019-07-12 MED ORDER — SODIUM CHLORIDE 0.9 % IV SOLN: 500.0000 mL | Freq: Once | INTRAVENOUS | Status: DC

## 2019-07-12 NOTE — Progress Notes (Signed)
To PACU, VSS. Report to [email or web address] 

## 2019-07-12 NOTE — Progress Notes (Signed)
Temp check by:JB Vital check by:CW  The patient states no changes in medical or surgical history since pre-visit screening on 07/02/2019.

## 2019-07-12 NOTE — Progress Notes (Signed)
HIPPA - no report to pt's care partner.  No problems noted in the recovery room.  maw

## 2019-07-12 NOTE — Op Note (Signed)
[facility] Patient Name: Carolyn Espinoza Procedure Date: 07/12/2019 2:52 PM MRN: [id number] Endoscopist: Jerene Bears , MD Age: 53 Referring MD:  Date of Birth: 1966/06/22 Gender: Female Account #: [id number] Procedure:                Colonoscopy Indications:              High risk colon cancer surveillance: Personal                            history of adenoma (10 mm or greater in size), Last                            colonoscopy: February 2018 Medicines:                Monitored Anesthesia Care Procedure:                Pre-Anesthesia Assessment:                           - Prior to the procedure, a History and Physical                            was performed, and patient medications and                            allergies were reviewed. The patient's tolerance of                            previous anesthesia was also reviewed. The risks                            and benefits of the procedure and the sedation                            options and risks were discussed with the patient.                            All questions were answered, and informed consent                            was obtained. Prior Anticoagulants: The patient has                            taken no previous anticoagulant or antiplatelet                            agents. ASA Grade Assessment: II - A patient with                            mild systemic disease. After reviewing the risks                            and benefits, the patient was deemed in  satisfactory condition to undergo the procedure.                           After obtaining informed consent, the colonoscope                            was passed under direct vision. Throughout the                            procedure, the patient's blood pressure, pulse, and                            oxygen saturations were monitored continuously. The                            Colonoscope was introduced through the  anus and                            advanced to the cecum, identified by appendiceal                            orifice and ileocecal valve. The colonoscopy was                            performed without difficulty. The patient tolerated                            the procedure well. The quality of the bowel                            preparation was good. The ileocecal valve,                            appendiceal orifice, and rectum were photographed. Scope In: 2:56:45 PM Scope Out: 3:18:42 PM Scope Withdrawal Time: 0 hours 17 minutes 52 seconds  Total Procedure Duration: 0 hours 21 minutes 57 seconds  Findings:                 The digital rectal exam was normal.                           A tattoo was seen in the ascending colon. There was                            no evidence of residual polyp tissue.                           Two sessile polyps were found in the transverse                            colon. The polyps were 4 to 5 mm in size. These                            polyps were removed with a  cold snare. Resection                            and retrieval were complete.                           A 4 mm polyp was found in the sigmoid colon. The                            polyp was sessile. The polyp was removed with a                            cold snare. Resection and retrieval were complete.                           Multiple small and large-mouthed diverticula were                            found in the sigmoid colon, descending colon and                            ascending colon.                           Internal hemorrhoids were found during retroflexion. Complications:            No immediate complications. Estimated Blood Loss:     Estimated blood loss was minimal. Impression:               - A tattoo was seen in the ascending colon. There                            was no evidence of residual polyp tissue.                           - Two 4 to 5 mm polyps in the  transverse colon,                            removed with a cold snare. Resected and retrieved.                           - One 4 mm polyp in the sigmoid colon, removed with                            a cold snare. Resected and retrieved.                           - Diverticulosis in the sigmoid colon, in the                            descending colon and in the ascending colon.                           - Internal hemorrhoids. Recommendation:           -  Patient has a contact number available for                            emergencies. The signs and symptoms of potential                            delayed complications were discussed with the                            patient. Return to normal activities tomorrow.                            Written discharge instructions were provided to the                            patient.                           - Resume previous diet.                           - Continue present medications.                           - Await pathology results.                           - Repeat colonoscopy is recommended for                            surveillance. The colonoscopy date will be                            determined after pathology results from today's                            exam become available for review. Jerene Bears, MD 07/12/2019 3:21:48 PM This report has been signed electronically.

## 2019-07-12 NOTE — Progress Notes (Signed)
Called to room to assist during endoscopic procedure.  Patient ID and intended procedure confirmed with present staff. Received instructions for my participation in the procedure from the performing physician.  

## 2019-07-12 NOTE — Patient Instructions (Signed)
YOU HAD AN ENDOSCOPIC PROCEDURE TODAY AT [facility]:   Refer to the procedure report that was given to you for any specific questions about what was found during the examination.  If the procedure report does not answer your questions, please call your gastroenterologist to clarify.  If you requested that your care partner not be given the details of your procedure findings, then the procedure report has been included in a sealed envelope for you to review at your convenience later.  YOU SHOULD EXPECT: Some feelings of bloating in the abdomen. Passage of more gas than usual.  Walking can help get rid of the air that was put into your GI tract during the procedure and reduce the bloating. If you had a lower endoscopy (such as a colonoscopy or flexible sigmoidoscopy) you may notice spotting of blood in your stool or on the toilet paper. If you underwent a bowel prep for your procedure, you may not have a normal bowel movement for a few days.  Please Note:  You might notice some irritation and congestion in your nose or some drainage.  This is from the oxygen used during your procedure.  There is no need for concern and it should clear up in a day or so.  SYMPTOMS TO REPORT IMMEDIATELY:   Following lower endoscopy (colonoscopy or flexible sigmoidoscopy):  Excessive amounts of blood in the stool  Significant tenderness or worsening of abdominal pains  Swelling of the abdomen that is new, acute  Fever of 100F or higher   For urgent or emergent issues, a gastroenterologist can be reached at any hour by calling [phone].   DIET:  We do recommend a small meal at first, but then you may proceed to your regular diet.  Drink plenty of fluids but you should avoid alcoholic beverages for 24 hours.  ACTIVITY:  You should plan to take it easy for the rest of today and you should NOT DRIVE or use heavy machinery until tomorrow (because of the sedation medicines used during the test).     FOLLOW UP: Our staff will call the number listed on your records 48-72 hours following your procedure to check on you and address any questions or concerns that you may have regarding the information given to you following your procedure. If we do not reach you, we will leave a message.  We will attempt to reach you two times.  During this call, we will ask if you have developed any symptoms of COVID 19. If you develop any symptoms (ie: fever, flu-like symptoms, shortness of breath, cough etc.) before then, please call [phone].  If you test positive for Covid 19 in the 2 weeks post procedure, please call and report this information to [place].    If any biopsies were taken you will be contacted by phone or by letter within the next 1-3 weeks.  Please call us at [phone] if you have not heard about the biopsies in 3 weeks.    SIGNATURES/CONFIDENTIALITY: You and/or your care partner have signed paperwork which will be entered into your electronic medical record.  These signatures attest to the fact that that the information above on your After Visit Summary has been reviewed and is understood.  Full responsibility of the confidentiality of this discharge information lies with you and/or your care-partner.    Handouts were given to your care partner on polyps, diverticulosis, and hemorrhoids. You may resume your current medications today. Await biopsy results. Please call  if any questions or concerns.   

## 2019-07-14 ENCOUNTER — Telehealth: Payer: Self-pay | Admitting: *Deleted

## 2019-07-14 NOTE — Telephone Encounter (Signed)
  Follow up Call-  Call back number 07/12/2019  Post procedure Call Back phone  # [phone]  Permission to leave phone message Yes  Some recent data might be hidden     Patient questions:  Do you have a fever, pain , or abdominal swelling? No. Pain Score  0 *  Have you tolerated food without any problems? Yes.    Have you been able to return to your normal activities? Yes.    Do you have any questions about your discharge instructions: Diet   No. Medications  No. Follow up visit  No.  Do you have questions or concerns about your Care? No.  Actions: * If pain score is 4 or above: No action needed, pain <4.  1. Have you developed a fever since your procedure? no  2.   Have you had an respiratory symptoms (SOB or cough) since your procedure? no  3.   Have you tested positive for COVID 19 since your procedure no  4.   Have you had any family members/close contacts diagnosed with the COVID 19 since your procedure?  no   If yes to any of these questions please route to Joylene John, RN and Alphonsa Gin, Therapist, sports.

## 2019-07-14 NOTE — Telephone Encounter (Signed)
  Follow up Call-  Call back number 07/12/2019  Post procedure Call Back phone  # [phone]  Permission to leave phone message Yes  Some recent data might be hidden     Patient questions:  Do you have a fever, pain , or abdominal swelling? No. Pain Score  0 *  Have you tolerated food without any problems? Yes.    Have you been able to return to your normal activities? Yes.    Do you have any questions about your discharge instructions: Diet   No. Medications  No. Follow up visit  No.  Do you have questions or concerns about your Care? No.  Actions: * If pain score is 4 or above: No action needed, pain <4.  1. Have you developed a fever since your procedure? no  2.   Have you had an respiratory symptoms (SOB or cough) since your procedure? no  3.   Have you tested positive for COVID 19 since your procedure no  4.   Have you had any family members/close contacts diagnosed with the COVID 19 since your procedure?  no   If yes to any of these questions please route to Joylene John, RN and Alphonsa Gin, Therapist, sports.

## 2019-07-18 ENCOUNTER — Encounter: Payer: Self-pay | Admitting: Internal Medicine

## 2019-08-03 ENCOUNTER — Ambulatory Visit (INDEPENDENT_AMBULATORY_CARE_PROVIDER_SITE_OTHER): Payer: BC Managed Care – PPO | Admitting: Primary Care

## 2019-08-03 ENCOUNTER — Other Ambulatory Visit: Payer: Self-pay

## 2019-08-03 ENCOUNTER — Encounter (INDEPENDENT_AMBULATORY_CARE_PROVIDER_SITE_OTHER): Payer: Self-pay | Admitting: Primary Care

## 2019-08-03 VITALS — BP 129/93 | HR 85 | Temp 97.3°F | Ht 63.0 in | Wt 203.2 lb

## 2019-08-03 DIAGNOSIS — T148XXA Other injury of unspecified body region, initial encounter: Secondary | ICD-10-CM | POA: Diagnosis not present

## 2019-08-03 DIAGNOSIS — Z23 Encounter for immunization: Secondary | ICD-10-CM | POA: Diagnosis not present

## 2019-08-03 DIAGNOSIS — I10 Essential (primary) hypertension: Secondary | ICD-10-CM | POA: Diagnosis not present

## 2019-08-03 DIAGNOSIS — W57XXXA Bitten or stung by nonvenomous insect and other nonvenomous arthropods, initial encounter: Secondary | ICD-10-CM | POA: Diagnosis not present

## 2019-08-03 DIAGNOSIS — F17200 Nicotine dependence, unspecified, uncomplicated: Secondary | ICD-10-CM

## 2019-08-03 MED ORDER — AMLODIPINE BESYLATE 10 MG PO TABS: 10.0000 mg | ORAL_TABLET | Freq: Every day | ORAL | 1 refills | Status: DC

## 2019-08-03 MED ORDER — DIPHENHYDRAMINE HCL 25 MG PO TABS: 25.0000 mg | ORAL_TABLET | Freq: Four times a day (QID) | ORAL | 0 refills | Status: DC | PRN

## 2019-08-03 MED ORDER — ALBUTEROL SULFATE HFA 108 (90 BASE) MCG/ACT IN AERS: 2.0000 | INHALATION_SPRAY | RESPIRATORY_TRACT | 0 refills | Status: DC | PRN

## 2019-08-03 MED ORDER — LISINOPRIL-HYDROCHLOROTHIAZIDE 20-12.5 MG PO TABS: 2.0000 | ORAL_TABLET | Freq: Every day | ORAL | 1 refills | Status: DC

## 2019-08-03 MED ORDER — TRIAMCINOLONE ACETONIDE 0.1 % EX CREA: 1.0000 "application " | TOPICAL_CREAM | Freq: Two times a day (BID) | CUTANEOUS | 1 refills | Status: DC

## 2019-08-03 NOTE — Progress Notes (Signed)
Acute Office Visit  Subjective:    Patient ID: Carolyn Espinoza, female    DOB: 08-16-1965, 54 y.o.   MRN: [id number]  Chief Complaint  Patient presents with  . Rash    left arm    HPI Patient is in today for a rash on her left arm between elbow and wrist she knows something bite her because she wave it off her arm felt line a sting. She presents 2 weeks later with self resolving features swelling and rash but continues to itch. Blood pressure diastolic elevated she stated she just left work.   Past Medical History:  Diagnosis Date  . HTN (hypertension) 2012    Past Surgical History:  Procedure Laterality Date  . COLONOSCOPY    . DENTAL SURGERY  2003   multiple teeth removed  . POLYPECTOMY      Family History  Problem Relation Age of Onset  . Cancer Father   . Hypertension Father   . Prostate cancer Father   . COPD Father   . Heart failure Mother   . Diabetes Mother   . Hypertension Mother   . Kidney disease Mother   . Colon polyps Sister   . Colon cancer Neg Hx   . Esophageal cancer Neg Hx   . Stomach cancer Neg Hx   . Rectal cancer Neg Hx     Social History   Socioeconomic History  . Marital status: Single    Spouse name: Not on file  . Number of children: Not on file  . Years of education: Not on file  . Highest education level: Not on file  Occupational History  . Not on file  Tobacco Use  . Smoking status: Current Every Day Smoker    Packs/day: 0.25    Types: Cigarettes  . Smokeless tobacco: Never Used  Substance and Sexual Activity  . Alcohol use: Yes    Comment: occasional  . Drug use: No  . Sexual activity: Yes  Other Topics Concern  . Not on file  Social History Narrative   Patient lives alone.   Comes to appt by bus.   Does not exercise regularly.   Smokes cigarettes.   No recreational drug use.   Drinks 1 beer or liquor drink per day.         Social Determinants of Health   Financial Resource Strain:   . Difficulty of Paying  Living Expenses: Not on file  Food Insecurity:   . Worried About Charity fundraiser in the Last Year: Not on file  . Ran Out of Food in the Last Year: Not on file  Transportation Needs:   . Lack of Transportation (Medical): Not on file  . Lack of Transportation (Non-Medical): Not on file  Physical Activity:   . Days of Exercise per Week: Not on file  . Minutes of Exercise per Session: Not on file  Stress:   . Feeling of Stress : Not on file  Social Connections:   . Frequency of Communication with Friends and Family: Not on file  . Frequency of Social Gatherings with Friends and Family: Not on file  . Attends Religious Services: Not on file  . Active Member of Clubs or Organizations: Not on file  . Attends Archivist Meetings: Not on file  . Marital Status: Not on file  Intimate Partner Violence:   . Fear of Current or Ex-Partner: Not on file  . Emotionally Abused: Not on file  . Physically  Abused: Not on file  . Sexually Abused: Not on file    Outpatient Medications Prior to Visit  Medication Sig Dispense Refill  . Blood Pressure KIT 1 Bag by Does not apply route 3 (three) times daily. 1 kit 0  . Multiple Vitamin (MULTIVITAMIN WITH MINERALS) TABS tablet Take 1 tablet by mouth daily.    Marland Kitchen amLODipine (NORVASC) 10 MG tablet Take 1 tablet (10 mg total) by mouth daily. 90 tablet 1  . lisinopril-hydrochlorothiazide (ZESTORETIC) 20-12.5 MG tablet Take 2 tablets by mouth daily. 180 tablet 1  . albuterol (VENTOLIN HFA) 108 (90 Base) MCG/ACT inhaler Inhale 2 puffs into the lungs every 4 (four) hours as needed for wheezing or shortness of breath. 6.7 g 0  . triamcinolone cream (KENALOG) 0.1 % Apply 1 application topically 2 (two) times daily. (Patient not taking: Reported on 08/03/2019) 60 g 0   No facility-administered medications prior to visit.    No Known Allergies  Review of Systems  HENT: Positive for postnasal drip.        Cough/congestion  Skin: Positive for color  change and rash.  All other systems reviewed and are negative.      Objective:    Physical Exam Vitals reviewed.  Constitutional:      Appearance: Normal appearance.  HENT:     Head: Normocephalic.     Nose: Nose normal.  Cardiovascular:     Rate and Rhythm: Normal rate and regular rhythm.  Pulmonary:     Effort: Pulmonary effort is normal.     Breath sounds: Normal breath sounds.  Musculoskeletal:        General: Normal range of motion.     Cervical back: Normal range of motion and neck supple.  Neurological:     Mental Status: She is alert and oriented to person, place, and time.  Psychiatric:        Mood and Affect: Mood normal.        Behavior: Behavior normal.     BP (!) 129/93 (BP Location: Left Arm, Patient Position: Sitting, Cuff Size: Normal)   Pulse 85   Temp (!) 97.3 F (36.3 C) (Temporal)   Ht _0  (1.6 m)   Wt 203 lb 3.2 oz (92.2 kg)   LMP 02/11/2016   SpO2 97%   BMI 36.00 kg/m  Wt Readings from Last 3 Encounters:  08/03/19 203 lb 3.2 oz (92.2 kg)  07/12/19 195 lb (88.5 kg)  07/02/19 195 lb (88.5 kg)    Health Maintenance Due  Topic Date Due  . PAP SMEAR-Modifier  05/29/1985    There are no preventive care reminders to display for this patient.   No results found for: TSH Lab Results  Component Value Date   WBC 4.1 05/10/2019   HGB 17.1 (H) 05/10/2019   HCT 50.1 (H) 05/10/2019   MCV 90 05/10/2019   PLT 217 05/10/2019   Lab Results  Component Value Date   NA 136 05/10/2019   K 4.5 05/10/2019   CO2 23 05/10/2019   GLUCOSE 72 05/10/2019   BUN 9 05/10/2019   CREATININE 0.85 05/10/2019   BILITOT 0.5 05/10/2019   ALKPHOS 90 05/10/2019   AST 26 05/10/2019   ALT 27 05/10/2019   PROT 9.0 (H) 05/10/2019   ALBUMIN 4.1 05/10/2019   CALCIUM 9.6 05/10/2019   ANIONGAP 11 11/01/2015   Lab Results  Component Value Date   CHOL 167 05/10/2019   Lab Results  Component Value Date   HDL 52  05/10/2019   Lab Results  Component Value Date    LDLCALC 96 05/10/2019   Lab Results  Component Value Date   TRIG 105 05/10/2019   Lab Results  Component Value Date   CHOLHDL 3.2 05/10/2019   Lab Results  Component Value Date   HGBA1C 5.2 03/31/2018       Assessment & Plan:   Kaeleigh was seen today for rash.  Diagnoses and all orders for this visit:  Bug bite, initial encounter -     diphenhydrAMINE (BENADRYL ALLERGY) 25 MG tablet; Take 1 tablet (25 mg total) by mouth every 6 (six) hours as needed.  Need for Tdap vaccination -     Tdap vaccine greater than or equal to 7yo IM  Hypertension, unspecified type Blood pressure diastolic slightly elevated stated this is because I just got off of work . My blood pressure has been good. Reinforce sodium intake and exercising can assist with lowering Bp  -     amLODipine (NORVASC) 10 MG tablet; Take 1 tablet (10 mg total) by mouth daily. -     lisinopril-hydrochlorothiazide (ZESTORETIC) 20-12.5 MG tablet; Take 2 tablets by mouth daily.  Tobacco use disorder Discussed and made aware of  increased risk for lung cancer and other respiratory diseases recommend cessation.  Can be underlying cause of shortness of breath and needing albuterol  Other orders -     triamcinolone cream (KENALOG) 0.1 %; Apply 1 application topically 2 (two) times daily. -     albuterol (VENTOLIN HFA) 108 (90 Base) MCG/ACT inhaler; Inhale 2 puffs into the lungs every 4 (four) hours as needed for wheezing or shortness of breath.     Meds ordered this encounter  Medications  . triamcinolone cream (KENALOG) 0.1 %    Sig: Apply 1 application topically 2 (two) times daily.    Dispense:  60 g    Refill:  1  . amLODipine (NORVASC) 10 MG tablet    Sig: Take 1 tablet (10 mg total) by mouth daily.    Dispense:  90 tablet    Refill:  1  . albuterol (VENTOLIN HFA) 108 (90 Base) MCG/ACT inhaler    Sig: Inhale 2 puffs into the lungs every 4 (four) hours as needed for wheezing or shortness of breath.    Dispense:   6.7 g    Refill:  0  . lisinopril-hydrochlorothiazide (ZESTORETIC) 20-12.5 MG tablet    Sig: Take 2 tablets by mouth daily.    Dispense:  180 tablet    Refill:  1  . diphenhydrAMINE (BENADRYL ALLERGY) 25 MG tablet    Sig: Take 1 tablet (25 mg total) by mouth every 6 (six) hours as needed.    Dispense:  30 tablet    Refill:  0     Kerin Perna, NP

## 2019-08-03 NOTE — Patient Instructions (Signed)

## 2019-11-01 ENCOUNTER — Ambulatory Visit (INDEPENDENT_AMBULATORY_CARE_PROVIDER_SITE_OTHER): Payer: BC Managed Care – PPO | Admitting: Primary Care

## 2020-02-03 ENCOUNTER — Ambulatory Visit (INDEPENDENT_AMBULATORY_CARE_PROVIDER_SITE_OTHER): Payer: BC Managed Care – PPO | Admitting: Primary Care

## 2020-02-03 ENCOUNTER — Other Ambulatory Visit: Payer: Self-pay

## 2020-02-03 ENCOUNTER — Encounter (INDEPENDENT_AMBULATORY_CARE_PROVIDER_SITE_OTHER): Payer: Self-pay | Admitting: Primary Care

## 2020-02-03 VITALS — BP 130/83 | HR 78 | Temp 98.5°F | Ht 63.0 in | Wt 198.2 lb

## 2020-02-03 DIAGNOSIS — R062 Wheezing: Secondary | ICD-10-CM | POA: Diagnosis not present

## 2020-02-03 DIAGNOSIS — H0014 Chalazion left upper eyelid: Secondary | ICD-10-CM | POA: Diagnosis not present

## 2020-02-03 DIAGNOSIS — F17200 Nicotine dependence, unspecified, uncomplicated: Secondary | ICD-10-CM | POA: Diagnosis not present

## 2020-02-03 DIAGNOSIS — I10 Essential (primary) hypertension: Secondary | ICD-10-CM

## 2020-02-03 DIAGNOSIS — Z1231 Encounter for screening mammogram for malignant neoplasm of breast: Secondary | ICD-10-CM

## 2020-02-03 MED ORDER — AMLODIPINE BESYLATE 10 MG PO TABS: 10.0000 mg | ORAL_TABLET | Freq: Every day | ORAL | 1 refills | Status: DC

## 2020-02-03 MED ORDER — LISINOPRIL-HYDROCHLOROTHIAZIDE 20-12.5 MG PO TABS: 2.0000 | ORAL_TABLET | Freq: Every day | ORAL | 1 refills | Status: DC

## 2020-02-03 MED ORDER — ALBUTEROL SULFATE HFA 108 (90 BASE) MCG/ACT IN AERS: 2.0000 | INHALATION_SPRAY | RESPIRATORY_TRACT | 0 refills | Status: DC | PRN

## 2020-02-03 NOTE — Patient Instructions (Signed)
Chalazion  A chalazion is a swelling or lump on the eyelid. It can affect the upper eyelid or the lower eyelid. What are the causes? This condition may be caused by:  Long-lasting (chronic) inflammation of the eyelid glands.  A blocked oil gland in the eyelid. What are the signs or symptoms? Symptoms of this condition include:  Swelling of the eyelid. The swelling may spread to areas around the eye.  A hard lump on the eyelid.  Blurry vision. The lump on the eyelid may make it hard to see out of the eye. How is this diagnosed? This condition is diagnosed with an examination of the eye. How is this treated? This condition is treated by applying a warm compress to the eyelid. If the condition does not improve, it may be treated with:  Medicine that is injected into the chalazion by a health care provider.  Surgery.  Medicine that is applied to the eye. Follow these instructions at home: Managing pain and swelling  Apply a warm, moist compress to the eyelid 4-6 times a day for 10-15 minutes at a time. This will help to open any blocked glands and to reduce redness and swelling.  Apply over-the-counter and prescription medicines only as told by your health care provider. General instructions  Do not touch the chalazion.  Do not try to remove the pus. Do not squeeze the chalazion or stick it with a pin or needle.  Do not rub your eyes.  Wash your hands often. Dry your hands with a clean towel.  Keep your face, scalp, and eyebrows clean.  Avoid wearing eye makeup.  If the chalazion does not break open (rupture) on its own, return to your health care provider.  Keep all follow-up appointments as told by your health care provider. This is important. Contact a health care provider if:  Your eyelid has not improved in 4 weeks.  Your eyelid is getting worse.  You have a fever.  The chalazion does not rupture on its own after a month of home treatment. Get help right  away if:  You have pain in your eye.  Your vision changes.  The chalazion becomes painful or red.  The chalazion gets bigger. Summary  A chalazion is a swelling or lump on the upper or lower eyelid.  It may be caused by chronic inflammation or a blocked oil gland.  Apply a warm, moist compress to the eyelid 4-6 times a day for 10-15 minutes at a time.  Keep your face, scalp, and eyebrows clean. This information is not intended to replace advice given to you by your health care provider. Make sure you discuss any questions you have with your health care provider. Document Revised: 12/25/2017 Document Reviewed: 12/25/2017 Elsevier Patient Education  2020 [employer]  

## 2020-02-03 NOTE — Progress Notes (Signed)
Acute Office Visit  Subjective:    Patient ID: Carolyn Espinoza, female    DOB: 09-23-1965, 54 y.o.   MRN: [id number]  Chief Complaint  Patient presents with  . Stye    HPI Ms.Carolyn Espinoza is a 54 year old obese female who presents today for an acute visit - left eye lid is swollen this occurred last Thursday and she felt with putting warm compress and she purchased OTC Stye ointment.   Past Medical History:  Diagnosis Date  . HTN (hypertension) 2012    Past Surgical History:  Procedure Laterality Date  . COLONOSCOPY    . DENTAL SURGERY  2003   multiple teeth removed  . POLYPECTOMY      Family History  Problem Relation Age of Onset  . Cancer Father   . Hypertension Father   . Prostate cancer Father   . COPD Father   . Heart failure Mother   . Diabetes Mother   . Hypertension Mother   . Kidney disease Mother   . Colon polyps Sister   . Colon cancer Neg Hx   . Esophageal cancer Neg Hx   . Stomach cancer Neg Hx   . Rectal cancer Neg Hx     Social History   Socioeconomic History  . Marital status: Single    Spouse name: Not on file  . Number of children: Not on file  . Years of education: Not on file  . Highest education level: Not on file  Occupational History  . Not on file  Tobacco Use  . Smoking status: Current Every Day Smoker    Packs/day: 0.25    Types: Cigarettes  . Smokeless tobacco: Never Used  Vaping Use  . Vaping Use: Never used  Substance and Sexual Activity  . Alcohol use: Yes    Comment: occasional  . Drug use: No  . Sexual activity: Yes  Other Topics Concern  . Not on file  Social History Narrative   Patient lives alone.   Comes to appt by bus.   Does not exercise regularly.   Smokes cigarettes.   No recreational drug use.   Drinks 1 beer or liquor drink per day.         Social Determinants of Health   Financial Resource Strain:   . Difficulty of Paying Living Expenses:   Food Insecurity:   . Worried About Sales executive in the Last Year:   . Arboriculturist in the Last Year:   Transportation Needs:   . Film/video editor (Medical):   Marland Kitchen Lack of Transportation (Non-Medical):   Physical Activity:   . Days of Exercise per Week:   . Minutes of Exercise per Session:   Stress:   . Feeling of Stress :   Social Connections:   . Frequency of Communication with Friends and Family:   . Frequency of Social Gatherings with Friends and Family:   . Attends Religious Services:   . Active Member of Clubs or Organizations:   . Attends Archivist Meetings:   Marland Kitchen Marital Status:   Intimate Partner Violence:   . Fear of Current or Ex-Partner:   . Emotionally Abused:   Marland Kitchen Physically Abused:   . Sexually Abused:     Outpatient Medications Prior to Visit  Medication Sig Dispense Refill  . Blood Pressure KIT 1 Bag by Does not apply route 3 (three) times daily. 1 kit 0  . triamcinolone cream (KENALOG) 0.1 %  Apply 1 application topically 2 (two) times daily. 60 g 1  . amLODipine (NORVASC) 10 MG tablet Take 1 tablet (10 mg total) by mouth daily. 90 tablet 1  . lisinopril-hydrochlorothiazide (ZESTORETIC) 20-12.5 MG tablet Take 2 tablets by mouth daily. 180 tablet 1  . Multiple Vitamin (MULTIVITAMIN WITH MINERALS) TABS tablet Take 1 tablet by mouth daily.    Marland Kitchen albuterol (VENTOLIN HFA) 108 (90 Base) MCG/ACT inhaler Inhale 2 puffs into the lungs every 4 (four) hours as needed for wheezing or shortness of breath. 6.7 g 0  . diphenhydrAMINE (BENADRYL ALLERGY) 25 MG tablet Take 1 tablet (25 mg total) by mouth every 6 (six) hours as needed. 30 tablet 0   No facility-administered medications prior to visit.    No Known Allergies  Review of Systems  HENT: Positive for ear pain.        Intermittent   Eyes: Positive for pain and discharge.      Objective:    Physical Exam Vitals reviewed.  Constitutional:      Appearance: She is obese.  HENT:     Right Ear: Tympanic membrane normal.     Left Ear: There  is impacted cerumen.  Eyes:     Extraocular Movements: Extraocular movements intact.     Pupils: Pupils are equal, round, and reactive to light.     BP 130/83 (BP Location: Left Arm, Patient Position: Sitting, Cuff Size: Normal)   Pulse 78   Temp 98.5 F (36.9 C) (Oral)   Ht 5' 3"  (1.6 m)   Wt 198 lb 3.2 oz (89.9 kg)   LMP 02/11/2016   SpO2 100%   BMI 35.11 kg/m  Wt Readings from Last 3 Encounters:  02/03/20 198 lb 3.2 oz (89.9 kg)  08/03/19 203 lb 3.2 oz (92.2 kg)  07/12/19 195 lb (88.5 kg)    Health Maintenance Due  Topic Date Due  . PAP SMEAR-Modifier  Never done    There are no preventive care reminders to display for this patient.   No results found for: TSH Lab Results  Component Value Date   WBC 4.1 05/10/2019   HGB 17.1 (H) 05/10/2019   HCT 50.1 (H) 05/10/2019   MCV 90 05/10/2019   PLT 217 05/10/2019   Lab Results  Component Value Date   NA 136 05/10/2019   K 4.5 05/10/2019   CO2 23 05/10/2019   GLUCOSE 72 05/10/2019   BUN 9 05/10/2019   CREATININE 0.85 05/10/2019   BILITOT 0.5 05/10/2019   ALKPHOS 90 05/10/2019   AST 26 05/10/2019   ALT 27 05/10/2019   PROT 9.0 (H) 05/10/2019   ALBUMIN 4.1 05/10/2019   CALCIUM 9.6 05/10/2019   ANIONGAP 11 11/01/2015   Lab Results  Component Value Date   CHOL 167 05/10/2019   Lab Results  Component Value Date   HDL 52 05/10/2019   Lab Results  Component Value Date   LDLCALC 96 05/10/2019   Lab Results  Component Value Date   TRIG 105 05/10/2019   Lab Results  Component Value Date   CHOLHDL 3.2 05/10/2019   Lab Results  Component Value Date   HGBA1C 5.2 03/31/2018       Assessment & Plan:  Brian was seen today for stye.  Diagnoses and all orders for this visit:  Chalazion of left upper eyelid Show patient different type of eye problems style, chalazion and hordeolum. Treatment warm compress 4 time  Hypertension, unspecified type Counseled on blood pressure goal of less  than 130/80,  low-sodium, DASH diet, medication compliance, 150 minutes of moderate intensity exercise per week. -     amLODipine (NORVASC) 10 MG tablet; Take 1 tablet (10 mg total) by mouth daily. -     lisinopril-hydrochlorothiazide (ZESTORETIC) 20-12.5 MG tablet; Take 2 tablets by mouth daily.  Tobacco use disorder  Increased risk for lung cancer and other respiratory diseases recommend cessation.  This will be reminded at each clinical visit.  Wheezing -     albuterol (VENTOLIN HFA) 108 (90 Base) MCG/ACT inhaler; Inhale 2 puffs into the lungs every 4 (four) hours as needed for wheezing or shortness of breath.  Encounter for screening mammogram for malignant neoplasm of breast -     MM Digital Diagnostic Bilat; Future    Meds ordered this encounter  Medications  . amLODipine (NORVASC) 10 MG tablet    Sig: Take 1 tablet (10 mg total) by mouth daily.    Dispense:  90 tablet    Refill:  1  . albuterol (VENTOLIN HFA) 108 (90 Base) MCG/ACT inhaler    Sig: Inhale 2 puffs into the lungs every 4 (four) hours as needed for wheezing or shortness of breath.    Dispense:  6.7 g    Refill:  0  . lisinopril-hydrochlorothiazide (ZESTORETIC) 20-12.5 MG tablet    Sig: Take 2 tablets by mouth daily.    Dispense:  180 tablet    Refill:  1     Kerin Perna, NP

## 2020-02-16 ENCOUNTER — Ambulatory Visit
Admission: RE | Admit: 2020-02-16 | Discharge: 2020-02-16 | Disposition: A | Discharge status: Home / Self Care | Payer: BC Managed Care – PPO | Source: Ambulatory Visit | Attending: Primary Care | Admitting: Primary Care

## 2020-02-16 DIAGNOSIS — Z1231 Encounter for screening mammogram for malignant neoplasm of breast: Secondary | ICD-10-CM

## 2020-02-28 ENCOUNTER — Other Ambulatory Visit (HOSPITAL_COMMUNITY)
Admission: RE | Admit: 2020-02-28 | Discharge: 2020-02-28 | Disposition: A | Discharge status: Home / Self Care | Payer: BC Managed Care – PPO | Source: Ambulatory Visit | Attending: Primary Care | Admitting: Primary Care

## 2020-02-28 ENCOUNTER — Ambulatory Visit (INDEPENDENT_AMBULATORY_CARE_PROVIDER_SITE_OTHER): Payer: BC Managed Care – PPO | Admitting: Primary Care

## 2020-02-28 ENCOUNTER — Encounter (INDEPENDENT_AMBULATORY_CARE_PROVIDER_SITE_OTHER): Payer: Self-pay | Admitting: Primary Care

## 2020-02-28 ENCOUNTER — Other Ambulatory Visit: Payer: Self-pay

## 2020-02-28 VITALS — BP 138/92 | HR 69 | Temp 98.0°F | Ht 63.0 in | Wt 196.2 lb

## 2020-02-28 DIAGNOSIS — Z113 Encounter for screening for infections with a predominantly sexual mode of transmission: Secondary | ICD-10-CM

## 2020-02-28 DIAGNOSIS — Z124 Encounter for screening for malignant neoplasm of cervix: Secondary | ICD-10-CM | POA: Insufficient documentation

## 2020-02-28 DIAGNOSIS — R062 Wheezing: Secondary | ICD-10-CM | POA: Diagnosis not present

## 2020-02-28 DIAGNOSIS — I10 Essential (primary) hypertension: Secondary | ICD-10-CM

## 2020-02-28 MED ORDER — AMLODIPINE BESYLATE 10 MG PO TABS: 10.0000 mg | ORAL_TABLET | Freq: Every day | ORAL | 1 refills | Status: DC

## 2020-02-28 MED ORDER — LISINOPRIL-HYDROCHLOROTHIAZIDE 20-12.5 MG PO TABS: 2.0000 | ORAL_TABLET | Freq: Every day | ORAL | 1 refills | Status: DC

## 2020-02-28 MED ORDER — ALBUTEROL SULFATE HFA 108 (90 BASE) MCG/ACT IN AERS: 2.0000 | INHALATION_SPRAY | RESPIRATORY_TRACT | 0 refills | Status: DC | PRN

## 2020-02-28 NOTE — Progress Notes (Signed)
Subjective:     Carolyn Espinoza is a 53 y.o. female and is here for a comprehensive physical exam. The patient reports no problems.  Social History   Socioeconomic History  . Marital status: Single    Spouse name: Not on file  . Number of children: Not on file  . Years of education: Not on file  . Highest education level: Not on file  Occupational History  . Not on file  Tobacco Use  . Smoking status: Current Every Day Smoker    Packs/day: 0.25    Types: Cigarettes  . Smokeless tobacco: Never Used  Vaping Use  . Vaping Use: Never used  Substance and Sexual Activity  . Alcohol use: Yes    Comment: occasional  . Drug use: No  . Sexual activity: Yes  Other Topics Concern  . Not on file  Social History Narrative   Patient lives alone.   Comes to appt by bus.   Does not exercise regularly.   Smokes cigarettes.   No recreational drug use.   Drinks 1 beer or liquor drink per day.         Social Determinants of Health   Financial Resource Strain:   . Difficulty of Paying Living Expenses:   Food Insecurity:   . Worried About Charity fundraiser in the Last Year:   . Arboriculturist in the Last Year:   Transportation Needs:   . Film/video editor (Medical):   Marland Kitchen Lack of Transportation (Non-Medical):   Physical Activity:   . Days of Exercise per Week:   . Minutes of Exercise per Session:   Stress:   . Feeling of Stress :   Social Connections:   . Frequency of Communication with Friends and Family:   . Frequency of Social Gatherings with Friends and Family:   . Attends Religious Services:   . Active Member of Clubs or Organizations:   . Attends Archivist Meetings:   Marland Kitchen Marital Status:   Intimate Partner Violence:   . Fear of Current or Ex-Partner:   . Emotionally Abused:   Marland Kitchen Physically Abused:   . Sexually Abused:    Health Maintenance  Topic Date Due  . PAP SMEAR-Modifier  Never done  . INFLUENZA VACCINE  02/20/2020  . MAMMOGRAM  02/15/2022  .  Fecal DNA (Cologuard)  07/11/2022  . TETANUS/TDAP  08/02/2029  . COVID-19 Vaccine  Completed  . Hepatitis C Screening  Completed  . HIV Screening  Completed    Review of Systems A comprehensive review of systems was negative.   Objective:  BP (!) 138/92 (BP Location: Right Arm, Patient Position: Sitting, Cuff Size: Normal)   Pulse 69   Temp 98 F (36.7 C) (Oral)   Ht 5\' 3"  (1.6 m)   Wt 196 lb 3.2 oz (89 kg)   LMP 02/11/2016   SpO2 95%   BMI 34.76 kg/m    Assessment:  CONSTITUTIONAL: Well-developed, well-nourished obese female in no acute distress.  HENT:  Normocephalic, atraumatic, External right and left ear normal. EYES: Conjunctivae and EOM are normal. Pupils are equal, round, and reactive to light. No scleral icterus.  NECK: Normal range of motion, supple, no masses.  Normal thyroid.  SKIN: Skin is warm and dry. No rash noted. Not diaphoretic. No erythema. No pallor. Hominy: Alert and oriented to person, place, and time. Normal reflexes, muscle tone coordination. No cranial nerve deficit noted. PSYCHIATRIC: Normal mood and affect. Normal behavior. Normal judgment  and thought content. CARDIOVASCULAR: Normal heart rate noted, regular rhythm RESPIRATORY: Clear to auscultation bilaterally. Effort and breath sounds normal, no problems with respiration noted. BREASTS: Taught SBE. ABDOMEN: Soft, normal bowel sounds, no distention noted.  No tenderness, rebound or guarding.  PELVIC: Normal appearing external genitalia; normal appearing vaginal mucosa and cervix.  No abnormal discharge noted.  Pap smear obtained.  Normal uterine size, no other palpable masses, no uterine or adnexal tenderness. MUSCULOSKELETAL: Normal range of motion. No tenderness.  No cyanosis, clubbing, or edema.  2+ distal pulses.     Plan:  Carolyn Espinoza was seen today for gynecologic exam.  Diagnoses and all orders for this visit:  Screening for malignant neoplasm of cervix Pap completed   Hypertension,  unspecified type Counseled on blood pressure goal of less than 130/80, low-sodium, DASH diet, medication compliance, 150 minutes of moderate intensity exercise per week.  -     lisinopril-hydrochlorothiazide (ZESTORETIC) 20-12.5 MG tablet; Take 2 tablets by mouth daily. -     amLODipine (NORVASC) 10 MG tablet; Take 1 tablet (10 mg total) by mouth daily.  Wheezing -     albuterol (VENTOLIN HFA) 108 (90 Base) MCG/ACT inhaler; Inhale 2 puffs into the lungs every 4 (four) hours as needed for wheezing or shortness of breath.  Screening for STD (sexually transmitted disease) Ancillary cervical    See After Visit Summary for Counseling Recommendations

## 2020-02-29 ENCOUNTER — Telehealth (INDEPENDENT_AMBULATORY_CARE_PROVIDER_SITE_OTHER): Payer: Self-pay

## 2020-02-29 LAB — CYTOLOGY - PAP: Diagnosis: NEGATIVE

## 2020-02-29 LAB — CERVICOVAGINAL ANCILLARY ONLY
Bacterial Vaginitis (gardnerella): NEGATIVE
Candida Glabrata: NEGATIVE
Candida Vaginitis: NEGATIVE
Chlamydia: NEGATIVE
Comment: NEGATIVE
Comment: NEGATIVE
Comment: NEGATIVE
Comment: NEGATIVE
Comment: NEGATIVE
Comment: NORMAL
Neisseria Gonorrhea: NEGATIVE
Trichomonas: NEGATIVE

## 2020-02-29 NOTE — Telephone Encounter (Signed)
-----   Message from Kerin Perna, NP sent at 02/29/2020  1:57 PM EDT ----- Cervical axillary is negative for all sexually transmitted diseases

## 2020-02-29 NOTE — Telephone Encounter (Signed)
Patient is aware that she is negative for STD. Will call with pap results when available. Nat Christen, CMA

## 2020-04-25 ENCOUNTER — Ambulatory Visit (INDEPENDENT_AMBULATORY_CARE_PROVIDER_SITE_OTHER): Payer: Self-pay | Admitting: Primary Care

## 2020-04-25 NOTE — Telephone Encounter (Signed)
Pt reports SOB, onset Friday, "But a lot worse today." Reports "Sharp pain right breast area when I breathe in." Rates at 8/10. Also reports chest tightness, wheezing. States afebrile. Reports moderate cough, productive, clear phlegm. No known covid exposure.  Pt states "I've had pneumonia and worried this feels like it." Pt directed to ED, states will follow disposition.  Reason for Disposition  [1] MODERATE difficulty breathing (e.g., speaks in phrases, SOB even at rest, pulse 100-120) AND [2] NEW-onset or WORSE than normal  Answer Assessment - Initial Assessment Questions 1. RESPIRATORY STATUS: "Describe your breathing?" (e.g., wheezing, shortness of breath, unable to speak, severe coughing)     Wheezing, cough 2. ONSET: "When did this breathing problem begin?"      Friday 3. PATTERN "Does the difficult breathing come and go, or has it been constant since it started?"     Constant 4. SEVERITY: "How bad is your breathing?" (e.g., mild, moderate, severe)    - MILD: No SOB at rest, mild SOB with walking, speaks normally in sentences, can lay down, no retractions, pulse < 100.    - MODERATE: SOB at rest, SOB with minimal exertion and prefers to sit, cannot lie down flat, speaks in phrases, mild retractions, audible wheezing, pulse 100-120.    - SEVERE: Very SOB at rest, speaks in single words, struggling to breathe, sitting hunched forward, retractions, pulse > 120      8/10, constant when breathing, tightness of chest 5. RECURRENT SYMPTOM: "Have you had difficulty breathing before?" If Yes, ask: "When was the last time?" and "What happened that time?"      With pneumonia.  6. CARDIAC HISTORY: "Do you have any history of heart disease?" (e.g., heart attack, angina, bypass surgery, angioplasty)      no 7. LUNG HISTORY: "Do you have any history of lung disease?"  (e.g., pulmonary embolus, asthma, emphysema)     8. CAUSE: "What do you think is causing the breathing problem?"      unsure 9.  OTHER SYMPTOMS: "Do you have any other symptoms? (e.g., dizziness, runny nose, cough, chest pain, fever)    Cough, right side hurts near breast when deep breath.  Protocols used: BREATHING DIFFICULTY-A-AH

## 2020-04-28 ENCOUNTER — Encounter (HOSPITAL_COMMUNITY): Payer: Self-pay | Admitting: Emergency Medicine

## 2020-04-28 ENCOUNTER — Emergency Department (HOSPITAL_COMMUNITY): Payer: BC Managed Care – PPO

## 2020-04-28 ENCOUNTER — Emergency Department (HOSPITAL_COMMUNITY)
Admission: EM | Admit: 2020-04-28 | Discharge: 2020-04-28 | Disposition: A | Discharge status: Home / Self Care | Payer: BC Managed Care – PPO | Attending: Emergency Medicine | Admitting: Emergency Medicine

## 2020-04-28 ENCOUNTER — Other Ambulatory Visit: Payer: Self-pay

## 2020-04-28 DIAGNOSIS — R059 Cough, unspecified: Secondary | ICD-10-CM | POA: Diagnosis not present

## 2020-04-28 DIAGNOSIS — I10 Essential (primary) hypertension: Secondary | ICD-10-CM | POA: Insufficient documentation

## 2020-04-28 DIAGNOSIS — Z20822 Contact with and (suspected) exposure to covid-19: Secondary | ICD-10-CM | POA: Insufficient documentation

## 2020-04-28 DIAGNOSIS — F17210 Nicotine dependence, cigarettes, uncomplicated: Secondary | ICD-10-CM | POA: Diagnosis not present

## 2020-04-28 DIAGNOSIS — Z79899 Other long term (current) drug therapy: Secondary | ICD-10-CM | POA: Diagnosis not present

## 2020-04-28 DIAGNOSIS — R062 Wheezing: Secondary | ICD-10-CM | POA: Diagnosis not present

## 2020-04-28 DIAGNOSIS — R079 Chest pain, unspecified: Secondary | ICD-10-CM | POA: Insufficient documentation

## 2020-04-28 LAB — CBC
HCT: 45 % (ref 36.0–46.0)
Hemoglobin: 15.2 g/dL — ABNORMAL HIGH (ref 12.0–15.0)
MCH: 31.7 pg (ref 26.0–34.0)
MCHC: 33.8 g/dL (ref 30.0–36.0)
MCV: 93.9 fL (ref 80.0–100.0)
Platelets: 240 10*3/uL (ref 150–400)
RBC: 4.79 MIL/uL (ref 3.87–5.11)
RDW: 14.4 % (ref 11.5–15.5)
WBC: 8.6 10*3/uL (ref 4.0–10.5)
nRBC: 0 % (ref 0.0–0.2)

## 2020-04-28 LAB — RESPIRATORY PANEL BY RT PCR (FLU A&B, COVID)
Influenza A by PCR: NEGATIVE
Influenza B by PCR: NEGATIVE
SARS Coronavirus 2 by RT PCR: NEGATIVE

## 2020-04-28 LAB — BASIC METABOLIC PANEL
Anion gap: 11 (ref 5–15)
BUN: 11 mg/dL (ref 6–20)
CO2: 24 mmol/L (ref 22–32)
Calcium: 9.2 mg/dL (ref 8.9–10.3)
Chloride: 106 mmol/L (ref 98–111)
Creatinine, Ser: 0.87 mg/dL (ref 0.44–1.00)
GFR, Estimated: >60 mL/min (ref >60)
Glucose, Bld: 116 mg/dL — ABNORMAL HIGH (ref 70–99)
Potassium: 3.7 mmol/L (ref 3.5–5.1)
Sodium: 141 mmol/L (ref 135–145)

## 2020-04-28 LAB — TROPONIN I (HIGH SENSITIVITY): Troponin I (High Sensitivity): 3 ng/L (ref <18)

## 2020-04-28 MED ORDER — PREDNISONE 10 MG (21) PO TBPK
ORAL_TABLET | ORAL | 0 refills | Status: DC
Start: 2020-04-28 — End: 2021-04-05

## 2020-04-28 NOTE — Discharge Instructions (Addendum)
  Test Results for COVID-19 pending  You have a test pending for COVID-19.  Results typically return within about 48 hours.  Be sure to check MyChart for updated results.  We recommend isolating yourself until results are received.  Patients who have symptoms consistent with COVID-19 should self isolated for: At least 3 days (72 hours) have passed since recovery, defined as resolution of fever without the use of fever reducing medications and improvement in respiratory symptoms (e.g., cough, shortness of breath), and At least 7 days have passed since symptoms first appeared.  If you have no symptoms, but your test returns positive, recommend isolating for at least 10 days.   Prednisone: Take the prednisone, as prescribed, until finished. If you are a diabetic, please know prednisone can raise your blood sugar temporarily.  Recommend follow up with your primary care provider for any further management of this issue.

## 2020-04-28 NOTE — ED Triage Notes (Signed)
Per pt, states she has been having right CP and cough since last Thursday-ha been vaccinated

## 2020-04-28 NOTE — ED Provider Notes (Signed)
[facility] DEPT Provider Note   CSN: [id number] Arrival date & time: 04/28/20  1335     History Chief Complaint  Patient presents with  . Chest Pain  . Cough    Carolyn Espinoza is a 54 y.o. female.  HPI  HPI: A 54 year old patient with a history of hypertension and obesity presents for evaluation of chest pain. Initial onset of pain was more than 6 hours ago. The patient's chest pain is not worse with exertion. The patient's chest pain is not middle- or left-sided, is not well-localized, is not described as heaviness/pressure/tightness, is not sharp and does not radiate to the arms/jaw/neck. The patient does not complain of nausea and denies diaphoresis. The patient has smoked in the past 90 days. The patient has no history of stroke, has no history of peripheral artery disease, denies any history of treated diabetes, has no relevant family history of coronary artery disease (first degree relative at less than age 91) and has no history of hypercholesterolemia.    Carolyn Espinoza is a 54 y.o. female, with a history of HTN, presenting to the ED with nonproductive cough for the last week. Also endorses intermittent body aches and right-sided chest soreness that is only present with coughing or palpation.  She does not have exertional chest pain. She states she had a negative Covid test through [facility] 2 days ago.  She has been prescribed an albuterol inhaler by her PCP for intermittent cough that is occurred over the last several months. She is vaccinated for COVID-19. Denies fever, other chest pain, syncope, dizziness, shortness of breath, lower extremity edema/pain, abdominal pain, N/V/D, or any other complaints.  Past Medical History:  Diagnosis Date  . HTN (hypertension) 2012    Patient Active Problem List   Diagnosis Date Noted  . CHALAZION, LEFT 10/06/2009  . FURUNCLE 05/16/2009  . PERIMENOPAUSAL SYNDROME 01/31/2009  . CARPAL TUNNEL SYNDROME,  RIGHT 10/20/2008  . ULNAR NEUROPATHY, RIGHT 10/20/2008  . TROCHANTERIC BURSITIS, RIGHT 10/20/2008  . ELEVATED BLOOD PRESSURE WITHOUT DIAGNOSIS OF HYPERTENSION 10/20/2008  . FIBROIDS, UTERUS 07/22/1993    Past Surgical History:  Procedure Laterality Date  . COLONOSCOPY    . DENTAL SURGERY  2003   multiple teeth removed  . POLYPECTOMY       OB History   No obstetric history on file.     Family History  Problem Relation Age of Onset  . Cancer Father   . Hypertension Father   . Prostate cancer Father   . COPD Father   . Heart failure Mother   . Diabetes Mother   . Hypertension Mother   . Kidney disease Mother   . Colon polyps Sister   . Colon cancer Neg Hx   . Esophageal cancer Neg Hx   . Stomach cancer Neg Hx   . Rectal cancer Neg Hx     Social History   Tobacco Use  . Smoking status: Current Every Day Smoker    Packs/day: 0.25    Types: Cigarettes  . Smokeless tobacco: Never Used  Vaping Use  . Vaping Use: Never used  Substance Use Topics  . Alcohol use: Yes    Comment: occasional  . Drug use: No    Home Medications Prior to Admission medications   Medication Sig Start Date End Date Taking? Authorizing Provider  albuterol (VENTOLIN HFA) 108 (90 Base) MCG/ACT inhaler Inhale 2 puffs into the lungs every 4 (four) hours as needed for wheezing or shortness of  breath. 02/28/20 03/29/20  Kerin Perna, NP  amLODipine (NORVASC) 10 MG tablet Take 1 tablet (10 mg total) by mouth daily. 02/28/20   Kerin Perna, NP  Blood Pressure KIT 1 Bag by Does not apply route 3 (three) times daily. 05/03/19   Kerin Perna, NP  lisinopril-hydrochlorothiazide (ZESTORETIC) 20-12.5 MG tablet Take 2 tablets by mouth daily. 02/28/20   Kerin Perna, NP  Multiple Vitamin (MULTIVITAMIN WITH MINERALS) TABS tablet Take 1 tablet by mouth daily.    [provider]  predniSONE (STERAPRED UNI-PAK 21 TAB) 10 MG (21) TBPK tablet Take 6 tabs (32m) day 1, 5 tabs (519m day  2, 4 tabs (4034mday 3, 3 tabs (32m31may 4, 2 tabs (20mg10my 5, and 1 tab (10mg)70m 6. 04/28/20   Lashunta Frieden C, PA-C  triamcinolone cream (KENALOG) 0.1 % Apply 1 application topically 2 (two) times daily. 08/03/19   EdwardKerin Perna  Allergies    Patient has no known allergies.  Review of Systems   Review of Systems  Constitutional: Negative for chills, diaphoresis and fever.  Respiratory: Positive for cough. Negative for shortness of breath.   Cardiovascular: Negative for leg swelling.  Gastrointestinal: Negative for abdominal pain, diarrhea, nausea and vomiting.  Musculoskeletal: Negative for back pain.       Right-sided chest soreness with coughing  Neurological: Negative for dizziness, syncope, weakness and numbness.  All other systems reviewed and are negative.   Physical Exam Updated Vital Signs BP (!) 161/94   Pulse 67   Temp 97.7 F (36.5 C) (Oral)   Resp (!) 22   Ht 5' 3"  (1.6 m)   Wt 93.9 kg   LMP 02/11/2016   SpO2 98%   BMI 36.67 kg/m   Physical Exam Vitals and nursing note reviewed. Exam conducted with a chaperone present.  Constitutional:      General: She is not in acute distress.    Appearance: She is well-developed. She is not diaphoretic.  HENT:     Head: Normocephalic and atraumatic.     Nose: Mucosal edema and rhinorrhea present.     Mouth/Throat:     Mouth: Mucous membranes are moist.     Pharynx: Oropharynx is clear.  Eyes:     Conjunctiva/sclera: Conjunctivae normal.  Cardiovascular:     Rate and Rhythm: Normal rate and regular rhythm.     Pulses: Normal pulses.          Radial pulses are 2+ on the right side and 2+ on the left side.       Posterior tibial pulses are 2+ on the right side and 2+ on the left side.     Heart sounds: Normal heart sounds.     Comments: Tactile temperature in the extremities appropriate and equal bilaterally. Pulmonary:     Effort: Pulmonary effort is normal. No respiratory distress.     Breath sounds:  Wheezing present.     Comments: Faint expiratory wheezes.  No increased work of breathing.  Speaks in full sentences without difficulty. Chest:     Chest wall: Tenderness present. No deformity, swelling, crepitus or edema.    Abdominal:     Palpations: Abdomen is soft.     Tenderness: There is no abdominal tenderness. There is no guarding.  Musculoskeletal:     Cervical back: Normal range of motion and neck supple.     Right lower leg: No edema.     Left lower leg: No  edema.  Lymphadenopathy:     Cervical: No cervical adenopathy.  Skin:    General: Skin is warm and dry.  Neurological:     Mental Status: She is alert.  Psychiatric:        Mood and Affect: Mood and affect normal.        Speech: Speech normal.        Behavior: Behavior normal.     ED Results / Procedures / Treatments   Labs (all labs ordered are listed, but only abnormal results are displayed) Labs Reviewed  BASIC METABOLIC PANEL - Abnormal; Notable for the following components:      Result Value   Glucose, Bld 116 (*)    All other components within normal limits  CBC - Abnormal; Notable for the following components:   Hemoglobin 15.2 (*)    All other components within normal limits  RESPIRATORY PANEL BY RT PCR (FLU A&B, COVID)  TROPONIN I (HIGH SENSITIVITY)    EKG EKG Interpretation  Date/Time:  Friday April 28 2020 15:33:29 EDT Ventricular Rate:  77 PR Interval:    QRS Duration: 78 QT Interval:  396 QTC Calculation: 449 R Axis:   68 Text Interpretation: Sinus rhythm Anteroseptal infarct, age indeterminate No significant change since last tracing Confirmed by Dorie Rank [phone]) on 04/28/2020 6:34:59 PM   Radiology DG Chest 2 View  Result Date: 04/28/2020 CLINICAL DATA:  Chest pain and cough for 8 days.  Smoker. EXAM: CHEST - 2 VIEW COMPARISON:  Radiographs 09/24/2013 and 08/20/2015. FINDINGS: The heart size and mediastinal contours are stable. There is mildly increased linear density at the  right lung base on the frontal examination, probably in the middle lobe and probably reflecting atelectasis or scarring. This is similar in appearance to the more recent acute abdominal series done 08/20/2015. There is no confluent airspace opacity, edema, pleural effusion or pneumothorax. No acute osseous findings. Telemetry leads overlie the chest. IMPRESSION: No acute cardiopulmonary process. Probable linear atelectasis or scarring in the right middle lobe, similar to most recent study from 2017. Electronically Signed   By: Richardean Sale M.D.   On: 04/28/2020 16:02    Procedures Procedures (including critical care time)  Medications Ordered in ED Medications - No data to display  ED Course  I have reviewed the triage vital signs and the nursing notes.  Pertinent labs & imaging results that were available during my care of the patient were reviewed by me and considered in my medical decision making (see chart for details).  Clinical Course as of Apr 28 2037  Fri Apr 28, 2020  1619 Patient has not taken her hypertension medications today.  She states she does have adequate supply of them at home, however.  BP(!): 147/100 [SJ]    Clinical Course User Index [SJ] Ranard Harte, Helane Gunther, PA-C   MDM Rules/Calculators/A&P HEAR Score: 3                        Patient presents with cough for the last week. Patient is nontoxic appearing, afebrile, not tachycardic, not tachypneic, not hypotensive, maintains excellent SPO2 on room air, and is in no apparent distress.   I have reviewed the patient's chart to obtain more information.   I reviewed and interpreted the patient's labs and radiological studies. No acute abnormalities noted on chest x-ray.  Lab work reassuring.   Low suspicion for ACS. No high risk/suspicious features; no exertional chest pain, vomiting, diaphoresis, or radiation. HEART score  is 3, indicating low risk for a cardiac event.  EKG without evidence of acute ischemia or  pathologic/symptomatic arrhythmia.  Troponin negative.  Qualifies for discharge following a single troponin. Wells criteria score is 0, indicating low risk for PE.   Dissection was considered, but thought less likely base on: History and description of the pain are not suggestive, patient is not ill-appearing, lack of risk factors, equal bilateral pulses, lack of neurologic deficits, no widened mediastinum on chest x-ray.  Treatment options were discussed with the patient.  She states she will use her albuterol inhalers at home rather than initiating such treatment here in the ED.  The patient was given instructions for home care as well as return precautions. Patient voices understanding of these instructions, accepts the plan, and is comfortable with discharge.    I discussed the patient's hypertension with her.  I do not think she is symptomatic to her hypertension.  Low suspicion for hypertensive emergency.   Final Clinical Impression(s) / ED Diagnoses Final diagnoses:  Cough    Rx / DC Orders ED Discharge Orders         Ordered    predniSONE (STERAPRED UNI-PAK 21 TAB) 10 MG (21) TBPK tablet        04/28/20 1628           Lorayne Bender, PA-C 04/28/20 2038    Dorie Rank, MD 04/29/20 [phone]

## 2020-08-03 ENCOUNTER — Other Ambulatory Visit (INDEPENDENT_AMBULATORY_CARE_PROVIDER_SITE_OTHER): Payer: Self-pay | Admitting: Primary Care

## 2020-08-03 DIAGNOSIS — I10 Essential (primary) hypertension: Secondary | ICD-10-CM

## 2020-09-21 IMAGING — MG DIGITAL SCREENING BILAT W/ TOMO W/ CAD
8 series · 8 of 24 positions shown · non-contrast
Comparison: Previous exam(s).

CLINICAL DATA: Screening.

EXAM:
DIGITAL SCREENING BILATERAL MAMMOGRAM WITH TOMO AND CAD

[L MLO synth-2D]
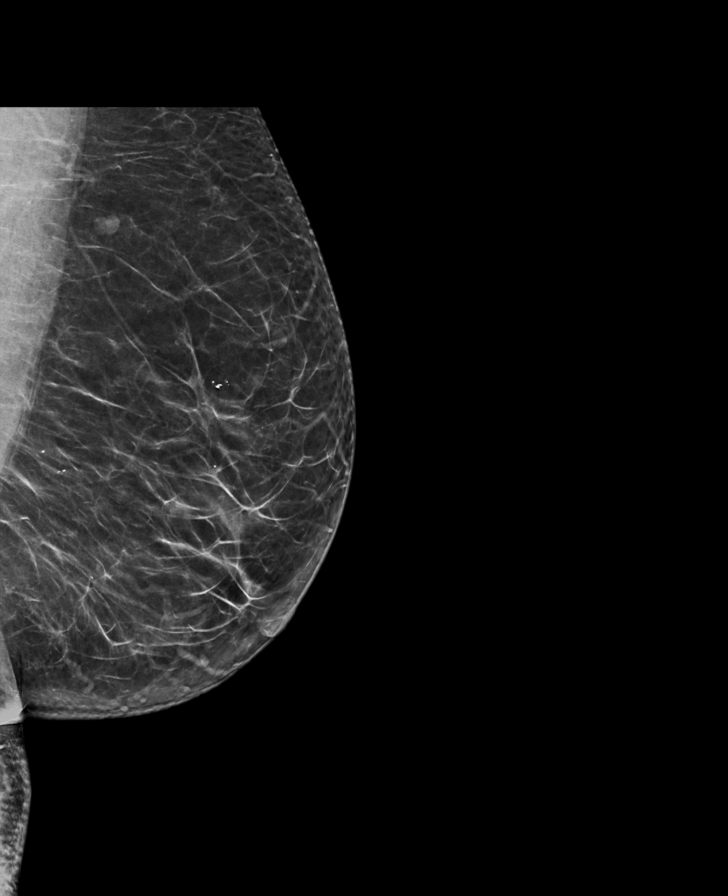

[L CC synth-2D]
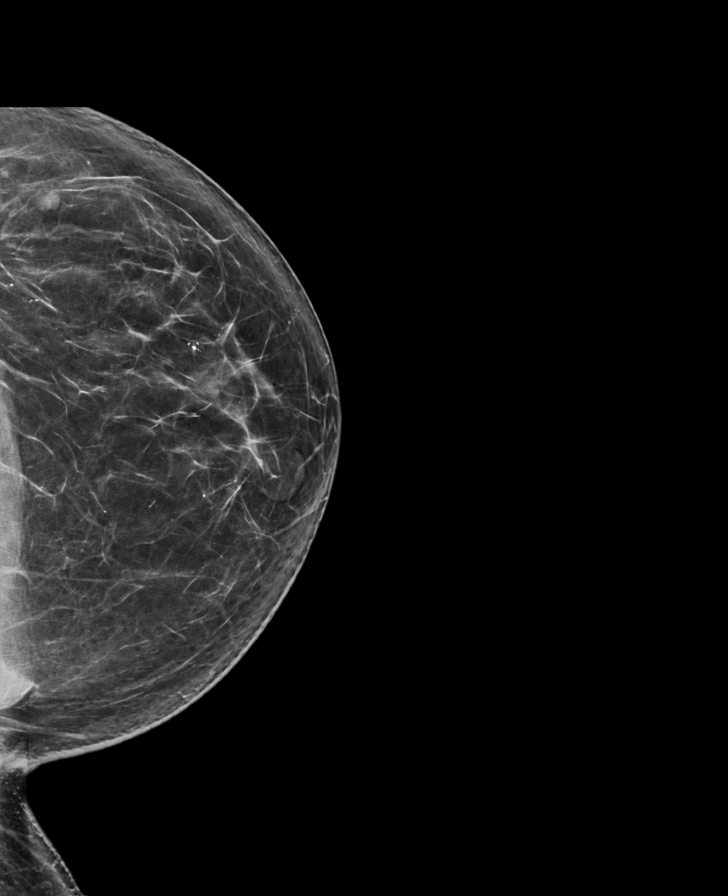

[R MLO synth-2D]
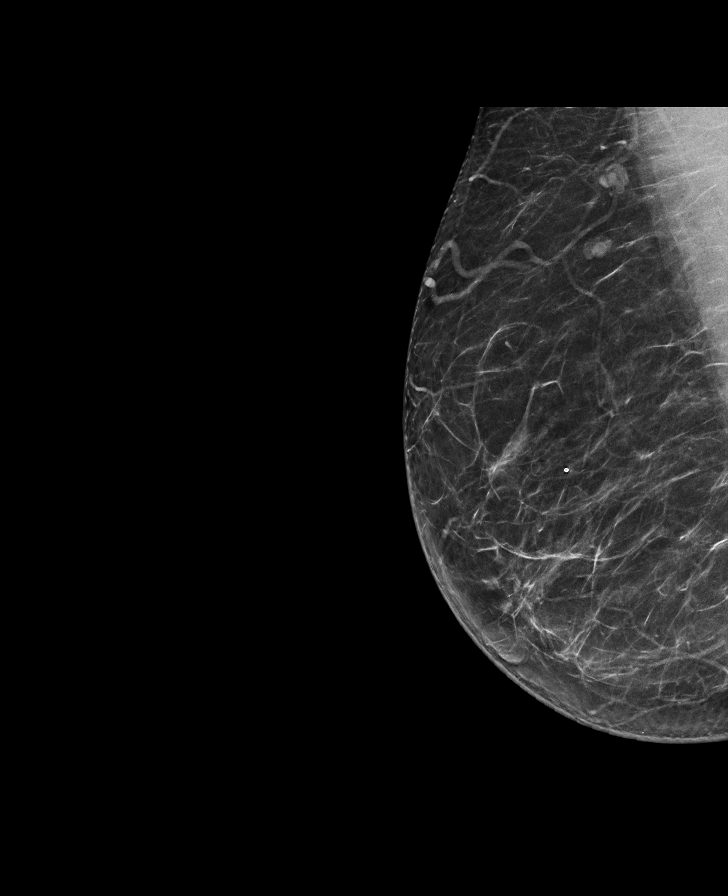

[R CC synth-2D]
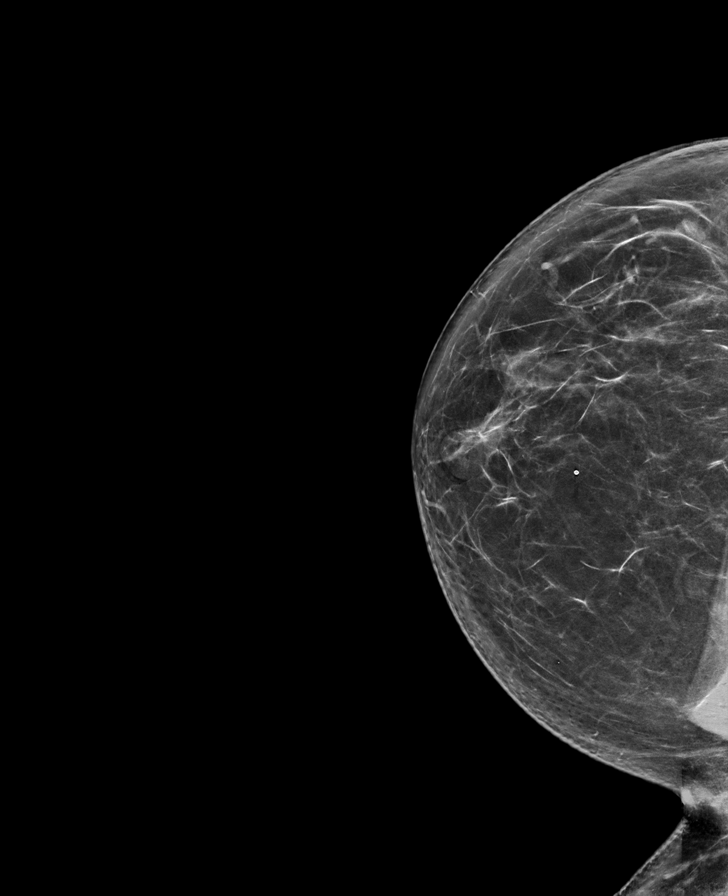

[L MLO tomo · tomo slice 39/77.0]
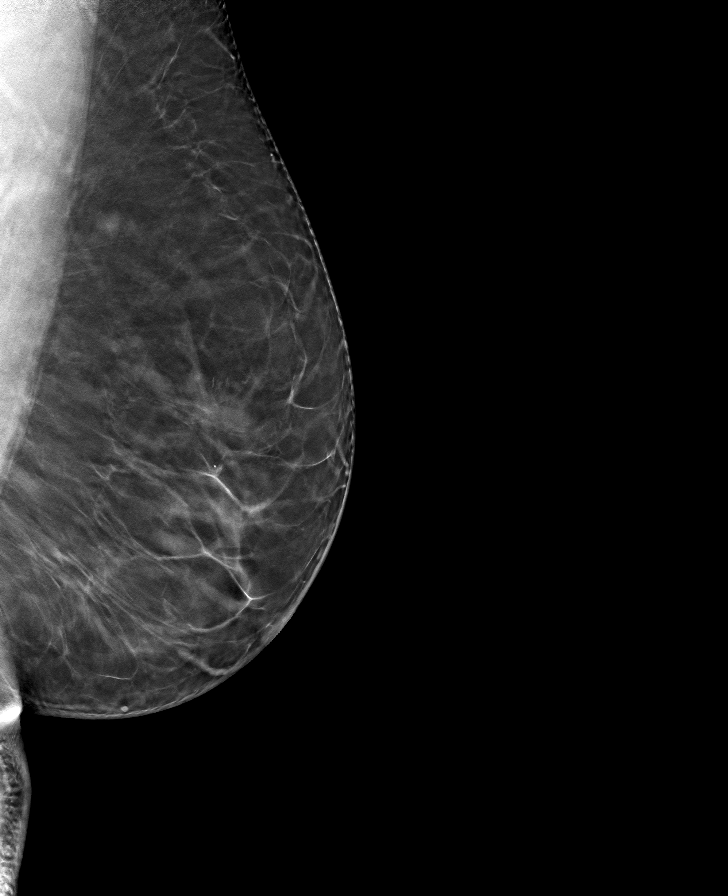

[R CC tomo · tomo slice 39/78.0]
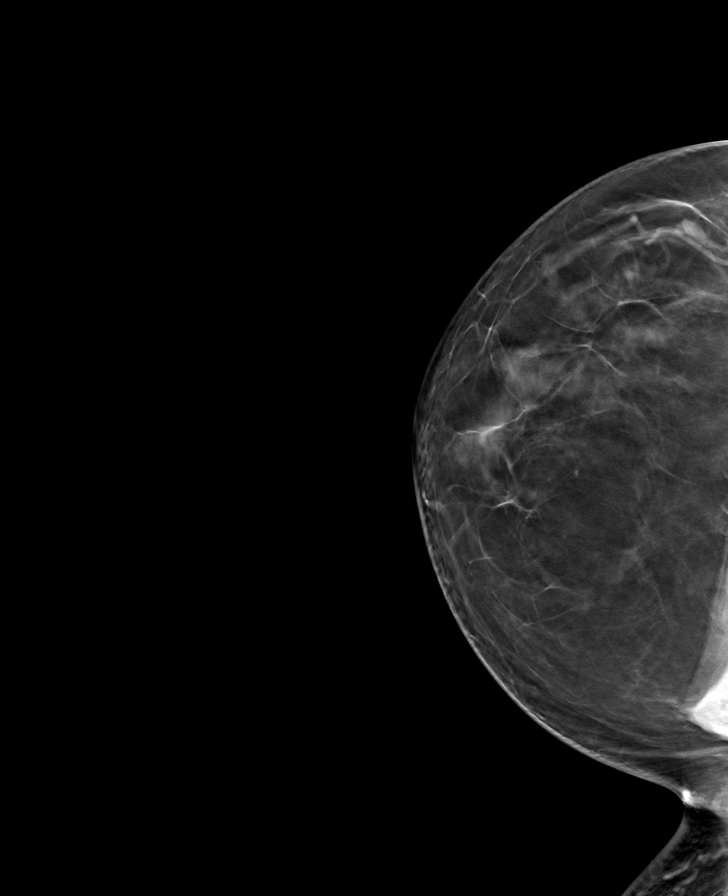

[R MLO tomo · tomo slice 39/76.0]
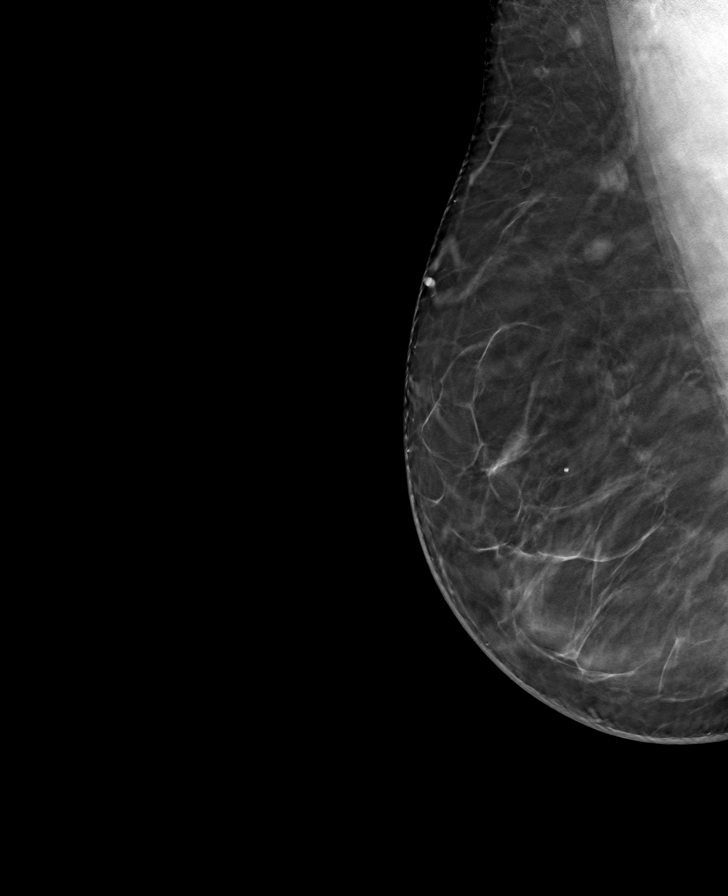

[L CC tomo · tomo slice 41/81.0]
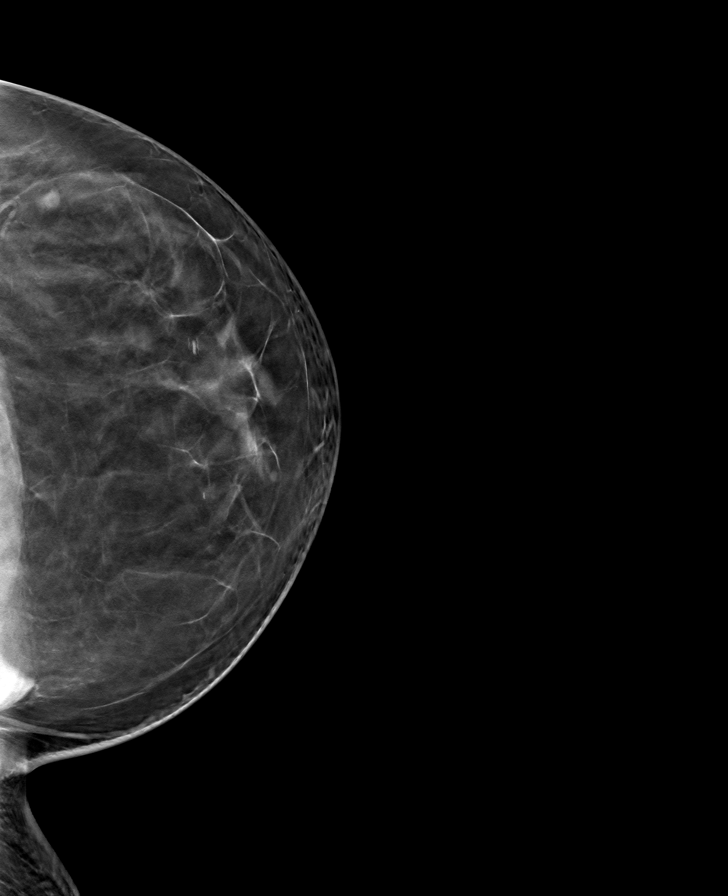

[8 of 24 positions shown; findings below may reference images not displayed]

ACR Breast Density Category b: There are scattered areas of
fibroglandular density.
FINDINGS: There are no findings suspicious for malignancy. Images were
processed with CAD.
IMPRESSION: No mammographic evidence of malignancy. A result letter of this
screening mammogram will be mailed directly to the patient.

RECOMMENDATION:
Screening mammogram in one year. (Code:[id number])

BI-RADS CATEGORY  1: Negative.

## 2020-11-15 ENCOUNTER — Other Ambulatory Visit (INDEPENDENT_AMBULATORY_CARE_PROVIDER_SITE_OTHER): Payer: Self-pay | Admitting: Primary Care

## 2020-11-15 DIAGNOSIS — R062 Wheezing: Secondary | ICD-10-CM

## 2020-11-15 NOTE — Telephone Encounter (Signed)
Requested Prescriptions  Pending Prescriptions Disp Refills  . albuterol (VENTOLIN HFA) 108 (90 Base) MCG/ACT inhaler [Pharmacy Med Name: ALBUTEROL HFA INH (200 PUFFS) 6.7GM] 6.7 g 0    Sig: INHALE 2 PUFFS INTO THE LUNGS EVERY 4 HOURS AS NEEDED FOR WHEEZING OR SHORTNESS OF BREATH     Pulmonology:  Beta Agonists Failed - 11/15/2020  6:28 PM      Failed - One inhaler should last at least one month. If the patient is requesting refills earlier, contact the patient to check for uncontrolled symptoms.      Passed - Valid encounter within last 12 months    Recent Outpatient Visits          8 months ago Screening for malignant neoplasm of cervix   [place] Kerin Perna, NP   9 months ago Chalazion of left upper eyelid   [place] Kerin Perna, NP   1 year ago Bug bite, initial encounter   [place], [place], NP   1 year ago Hypertension, unspecified type   [place] Kerin Perna, NP   1 year ago Hypertension, unspecified type   [place] Kerin Perna, NP

## 2021-02-14 ENCOUNTER — Other Ambulatory Visit: Payer: Self-pay

## 2021-04-05 ENCOUNTER — Ambulatory Visit (INDEPENDENT_AMBULATORY_CARE_PROVIDER_SITE_OTHER): Payer: Self-pay | Admitting: Primary Care

## 2021-04-05 ENCOUNTER — Other Ambulatory Visit: Payer: Self-pay

## 2021-04-05 ENCOUNTER — Encounter (INDEPENDENT_AMBULATORY_CARE_PROVIDER_SITE_OTHER): Payer: Self-pay | Admitting: Primary Care

## 2021-04-05 VITALS — BP 125/86 | HR 76 | Temp 97.3°F | Ht 63.0 in | Wt 204.8 lb

## 2021-04-05 DIAGNOSIS — F17200 Nicotine dependence, unspecified, uncomplicated: Secondary | ICD-10-CM

## 2021-04-05 DIAGNOSIS — Z760 Encounter for issue of repeat prescription: Secondary | ICD-10-CM

## 2021-04-05 DIAGNOSIS — N3946 Mixed incontinence: Secondary | ICD-10-CM

## 2021-04-05 DIAGNOSIS — F17210 Nicotine dependence, cigarettes, uncomplicated: Secondary | ICD-10-CM

## 2021-04-05 DIAGNOSIS — I10 Essential (primary) hypertension: Secondary | ICD-10-CM

## 2021-04-05 MED ORDER — LISINOPRIL-HYDROCHLOROTHIAZIDE 20-12.5 MG PO TABS: 2.0000 | ORAL_TABLET | Freq: Every day | ORAL | 1 refills | Status: DC

## 2021-04-05 MED ORDER — AMLODIPINE BESYLATE 10 MG PO TABS: 10.0000 mg | ORAL_TABLET | Freq: Every day | ORAL | 1 refills | Status: DC

## 2021-04-05 MED ORDER — OXYBUTYNIN CHLORIDE ER 5 MG PO TB24: 5.0000 mg | ORAL_TABLET | Freq: Every day | ORAL | 0 refills | Status: DC

## 2021-04-05 NOTE — Patient Instructions (Addendum)
Influenza, Adult Influenza is also called "the flu." It is an infection in the lungs, nose, and throat (respiratory tract). It spreads easily from person to person (is contagious). The flu causes symptoms that are like a cold, along with high fever and body aches. What are the causes? This condition is caused by the influenza virus. You can get the virus by: Breathing in droplets that are in the air after a person infected with the flu coughed or sneezed. Touching something that has the virus on it and then touching your mouth, nose, or eyes. What increases the risk? Certain things may make you more likely to get the flu. These include: Not washing your hands often. Having close contact with many people during cold and flu season. Touching your mouth, eyes, or nose without first washing your hands. Not getting a flu shot every year. You may have a higher risk for the flu, and serious problems, such as a lung infection (pneumonia), if you: Are older than 65. Are pregnant. Have a weakened disease-fighting system (immune system) because of a disease or because you are taking certain medicines. Have a long-term (chronic) condition, such as: Heart, kidney, or lung disease. Diabetes. Asthma. Have a liver disorder. Are very overweight (morbidly obese). Have anemia. What are the signs or symptoms? Symptoms usually begin suddenly and last 4-14 days. They may include: Fever and chills. Headaches, body aches, or muscle aches. Sore throat. Cough. Runny or stuffy (congested) nose. Feeling discomfort in your chest. Not wanting to eat as much as normal. Feeling weak or tired. Feeling dizzy. Feeling sick to your stomach or throwing up. How is this treated? If the flu is found early, you can be treated with antiviral medicine. This can help to reduce how bad the illness is and how long it lasts. This may be given by mouth or through an IV tube. Taking care of yourself at home can help your  symptoms get better. Your doctor may want you to: Take over-the-counter medicines. Drink plenty of fluids. The flu often goes away on its own. If you have very bad symptoms or other problems, you may be treated in a hospital. Follow these instructions at home:   Activity Rest as needed. Get plenty of sleep. Stay home from work or school as told by your doctor. Do not leave home until you do not have a fever for 24 hours without taking medicine. Leave home only to go to your doctor. Eating and drinking Take an ORS (oral rehydration solution). This is a drink that is sold at pharmacies and stores. Drink enough fluid to keep your pee pale yellow. Drink clear fluids in small amounts as you are able. Clear fluids include: Water. Ice chips. Fruit juice mixed with water. Low-calorie sports drinks. Eat bland foods that are easy to digest. Eat small amounts as you are able. These foods include: Bananas. Applesauce. Rice. Lean meats. Toast. Crackers. Do not eat or drink: Fluids that have a lot of sugar or caffeine. Alcohol. Spicy or fatty foods. General instructions Take over-the-counter and prescription medicines only as told by your doctor. Use a cool mist humidifier to add moisture to the air in your home. This can make it easier for you to breathe. When using a cool mist humidifier, clean it daily. Empty water and replace with clean water. Cover your mouth and nose when you cough or sneeze. Wash your hands with soap and water often and for at least 20 seconds. This is also important after  you cough or sneeze. If you cannot use soap and water, use alcohol-based hand sanitizer. Keep all follow-up visits. How is this prevented?  Get a flu shot every year. You may get the flu shot in late summer, fall, or winter. Ask your doctor when you should get your flu shot. Avoid contact with people who are sick during fall and winter. This is cold and flu season. Contact a doctor if: You get  new symptoms. You have: Chest pain. Watery poop (diarrhea). A fever. Your cough gets worse. You start to have more mucus. You feel sick to your stomach. You throw up. Get help right away if you: Have shortness of breath. Have trouble breathing. Have skin or nails that turn a bluish color. Have very bad pain or stiffness in your neck. Get a sudden headache. Get sudden pain in your face or ear. Cannot eat or drink without throwing up. These symptoms may represent a serious problem that is an emergency. Get medical help right away. Call your local emergency services (911 in the [place] Do not wait to see if the symptoms will go away. Do not drive yourself to the hospital. Summary Influenza is also called "the flu." It is an infection in the lungs, nose, and throat. It spreads easily from person to person. Take over-the-counter and prescription medicines only as told by your doctor. Getting a flu shot every year is the best way to not get the flu. This information is not intended to replace advice given to you by your health care provider. Make sure you discuss any questions you have with your health care provider. Document Revised: 02/25/2020 Document Reviewed: 02/25/2020 Elsevier Patient Education  [place].  Kegel exercises: A how-to guide for women Kegel exercises can prevent or control urinary incontinence and other pelvic floor problems. Here's a step-by-step guide to doing Kegel exercises correctly.  By [facility] Staff Kegel exercises strengthen the pelvic floor muscles, which support the uterus, bladder, small intestine and rectum. You can do Kegel exercises, also known as pelvic floor muscle training, just about anytime.  Start by understanding what Kegel exercises can do for you -- then follow these instructions for contracting and relaxing your pelvic floor muscles.  Why Kegel exercises matter Location of female pelvic floor muscles Open pop-up dialog box Many  factors can weaken your pelvic floor muscles, including pregnancy, childbirth, surgery, aging, excessive straining from constipation or chronic coughing, and being overweight.  You might benefit from doing Kegel exercises if you:  Leak a few drops of urine while sneezing, laughing or coughing (stress incontinence) Have a strong, sudden urge to urinate just before losing a large amount of urine (urinary urge incontinence) Leak stool (fecal incontinence) Kegel exercises can also be done during pregnancy or after childbirth to try to improve your symptoms.  Kegel exercises are less helpful for women who have severe urine leakage when they sneeze, cough or laugh. Also, Kegel exercises aren't helpful for women who unexpectedly leak small amounts of urine due to a full bladder (overflow incontinence).  How to do Kegel exercises To get started:  Find the right muscles. To identify your pelvic floor muscles, stop urination in midstream. Once you've identified your pelvic floor muscles you can do the exercises in any position, although you might find it easiest to do them lying down at first. Perfect your technique. To do Kegels, imagine you are sitting on a marble and tighten your pelvic muscles as if you're lifting the marble. Try  it for three seconds at a time, then relax for a count of three. Maintain your focus. For best results, focus on tightening only your pelvic floor muscles. Be careful not to flex the muscles in your abdomen, thighs or buttocks. Avoid holding your breath. Instead, breathe freely during the exercises. Repeat three times a day. Aim for at least three sets of 10 to 15 repetitions a day. Don't make a habit of using Kegel exercises to start and stop your urine stream. Doing Kegel exercises while emptying your bladder can actually lead to incomplete emptying of the bladder -- which increases the risk of a urinary tract infection.  When to do your Kegels Make Kegel exercises part of  your daily routine. You can do Kegel exercises discreetly just about any time, whether you're sitting at your desk or relaxing on the couch.  When you're having trouble If you're having trouble doing Kegel exercises, don't be embarrassed to ask for help. Your doctor or other health care provider can give you important feedback so that you learn to isolate and exercise the correct muscles.  In some cases, vaginal weighted cones or biofeedback might help. To use a vaginal cone, you insert it into your vagina and use pelvic muscle contractions to hold it in place during your daily activities. During a biofeedback session, your doctor or other health care provider inserts a pressure sensor into your vagina or rectum. As you relax and contract your pelvic floor muscles, a monitor will measure and display your pelvic floor activity.  When to expect results If you do Kegel exercises regularly, you can expect results -- such as less frequent urine leakage -- within about a few weeks to a few months. For continued benefits, make Kegel exercises a permanent part of your daily routine.

## 2021-04-05 NOTE — Progress Notes (Signed)
Established Patient Office Visit  Subjective:  Patient ID: Carolyn Espinoza, female    DOB: 02-Jul-1966  Age: 55 y.o. MRN: [id number]  CC:  Chief Complaint  Patient presents with   Hypertension    HPI [name] is a 55 year old obese female presents for medication refill and would like to stop smoking and eating . 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.Denies shortness of breath( only if she manic) , headaches, chest pain or lower extremity edema   Past Medical History:  Diagnosis Date   HTN (hypertension) 2012    Past Surgical History:  Procedure Laterality Date   COLONOSCOPY     DENTAL SURGERY  2003   multiple teeth removed   POLYPECTOMY      Family History  Problem Relation Age of Onset   Cancer Father    Hypertension Father    Prostate cancer Father    COPD Father    Heart failure Mother    Diabetes Mother    Hypertension Mother    Kidney disease Mother    Colon polyps Sister    Colon cancer Neg Hx    Esophageal cancer Neg Hx    Stomach cancer Neg Hx    Rectal cancer Neg Hx     Social History   Socioeconomic History   Marital status: Single    Spouse name: Not on file   Number of children: Not on file   Years of education: Not on file   Highest education level: Not on file  Occupational History   Not on file  Tobacco Use   Smoking status: Every Day    Packs/day: 0.50    Years: 30.00    Pack years: 15.00    Types: Cigarettes   Smokeless tobacco: Never  Vaping Use   Vaping Use: Never used  Substance and Sexual Activity   Alcohol use: Yes    Comment: occasional   Drug use: No   Sexual activity: Yes  Other Topics Concern   Not on file  Social History Narrative   Patient lives alone.   Comes to appt by bus.   Does not exercise regularly.   Smokes cigarettes.   No recreational drug use.   Drinks 1 beer or liquor drink per day.         Social Determinants of Health   Financial Resource Strain: Not on file  Food Insecurity: Not on file   Transportation Needs: Not on file  Physical Activity: Not on file  Stress: Not on file  Social Connections: Not on file  Intimate Partner Violence: Not on file    Outpatient Medications Prior to Visit  Medication Sig Dispense Refill   albuterol (VENTOLIN HFA) 108 (90 Base) MCG/ACT inhaler INHALE 2 PUFFS INTO THE LUNGS EVERY 4 HOURS AS NEEDED FOR WHEEZING OR SHORTNESS OF BREATH 6.7 g 0   Blood Pressure KIT 1 Bag by Does not apply route 3 (three) times daily. 1 kit 0   Multiple Vitamin (MULTIVITAMIN WITH MINERALS) TABS tablet Take 1 tablet by mouth daily.     amLODipine (NORVASC) 10 MG tablet Take 1 tablet (10 mg total) by mouth daily. (Patient not taking: Reported on 04/05/2021) 90 tablet 1   lisinopril-hydrochlorothiazide (ZESTORETIC) 20-12.5 MG tablet TAKE 2 TABLETS BY MOUTH DAILY (Patient not taking: Reported on 04/05/2021) 180 tablet 1   predniSONE (STERAPRED UNI-PAK 21 TAB) 10 MG (21) TBPK tablet Take 6 tabs (27m) day 1, 5 tabs (582m day 2, 4 tabs (  48m) day 3, 3 tabs (362m day 4, 2 tabs (2047mday 5, and 1 tab (50m59may 6. 21 tablet 0   triamcinolone cream (KENALOG) 0.1 % Apply 1 application topically 2 (two) times daily. 60 g 1   No facility-administered medications prior to visit.    No Known Allergies  ROS Review of Systems  Genitourinary:  Positive for frequency and urgency.  Psychiatric/Behavioral:  The patient is nervous/anxious.   All other systems reviewed and are negative.    Objective:    Physical Exam Vitals reviewed.  Constitutional:      Appearance: She is obese.  HENT:     Head: Normocephalic and atraumatic.     Right Ear: Tympanic membrane and external ear normal.     Left Ear: Tympanic membrane and external ear normal.     Nose: Nose normal.  Eyes:     Extraocular Movements: Extraocular movements intact.  Cardiovascular:     Rate and Rhythm: Normal rate and regular rhythm.  Pulmonary:     Effort: Pulmonary effort is normal.     Breath sounds:  Normal breath sounds.  Abdominal:     General: Bowel sounds are normal. There is distension.     Palpations: Abdomen is soft.  Musculoskeletal:        General: Normal range of motion.     Cervical back: Normal range of motion and neck supple.  Skin:    General: Skin is warm and dry.  Neurological:     Mental Status: She is alert and oriented to person, place, and time.  Psychiatric:        Mood and Affect: Mood normal.        Behavior: Behavior normal.        Thought Content: Thought content normal.        Judgment: Judgment normal.    BP 125/86 (BP Location: Right Arm, Patient Position: Sitting, Cuff Size: Large)   Pulse 76   Temp (!) 97.3 F (36.3 C) (Temporal)   Ht _0  (1.6 m)   Wt 204 lb 12.8 oz (92.9 kg)   LMP 02/11/2016   SpO2 93%   BMI 36.28 kg/m  Wt Readings from Last 3 Encounters:  04/05/21 204 lb 12.8 oz (92.9 kg)  04/28/20 207 lb (93.9 kg)  02/28/20 196 lb 3.2 oz (89 kg)     Health Maintenance Due  Topic Date Due   Zoster Vaccines- Shingrix (1 of 2) Never done   COVID-19 Vaccine (3 - Booster for Pfizer series) 03/09/2020   INFLUENZA VACCINE  02/19/2021    There are no preventive care reminders to display for this patient.  No results found for: TSH Lab Results  Component Value Date   WBC 8.6 04/28/2020   HGB 15.2 (H) 04/28/2020   HCT 45.0 04/28/2020   MCV 93.9 04/28/2020   PLT 240 04/28/2020   Lab Results  Component Value Date   NA 141 04/28/2020   K 3.7 04/28/2020   CO2 24 04/28/2020   GLUCOSE 116 (H) 04/28/2020   BUN 11 04/28/2020   CREATININE 0.87 04/28/2020   BILITOT 0.5 05/10/2019   ALKPHOS 90 05/10/2019   AST 26 05/10/2019   ALT 27 05/10/2019   PROT 9.0 (H) 05/10/2019   ALBUMIN 4.1 05/10/2019   CALCIUM 9.2 04/28/2020   ANIONGAP 11 04/28/2020   Lab Results  Component Value Date   CHOL 167 05/10/2019   Lab Results  Component Value Date   HDL 52 05/10/2019  Lab Results  Component Value Date   LDLCALC 96 05/10/2019    Lab Results  Component Value Date   TRIG 105 05/10/2019   Lab Results  Component Value Date   CHOLHDL 3.2 05/10/2019   Lab Results  Component Value Date   HGBA1C 5.2 03/31/2018      Assessment & Plan:  Annaya was seen today for hypertension.  Diagnoses and all orders for this visit:  Tobacco use disorder I have recommended complete cessation of tobacco use. I have discussed various options available for assistance with tobacco cessation including over the counter methods (Nicotine gum, patch and lozenges). We also discussed prescription options (Chantix, Nicotine Inhaler / Nasal Spray). The patient is not interested in pursuing any prescription tobacco cessation options at this time.  Hypertension, unspecified type Blood pressure goal is near goal of less than 130/80, low-sodium, DASH diet, medication compliance, 150 minutes of moderate intensity exercise per week. Discussed medication compliance, adverse effects.    Mixed stress and urge urinary incontinence Also discussed Kegel exercises and how to them .  Information provided on AVS -     oxybutynin (DITROPAN XL) 5 MG 24 hr tablet; Take 1 tablet (5 mg total) by mouth at bedtime.  Medication refills  amLODipine (NORVASC) 10 MG tablet; Take 1 tablet (10 mg total) by mouth daily. -     lisinopril-hydrochlorothiazide (ZESTORETIC) 20-12.5 MG tablet; Take 2 tablets by mouth daily.  Meds ordered this encounter  Medications   amLODipine (NORVASC) 10 MG tablet    Sig: Take 1 tablet (10 mg total) by mouth daily.    Dispense:  90 tablet    Refill:  1   lisinopril-hydrochlorothiazide (ZESTORETIC) 20-12.5 MG tablet    Sig: Take 2 tablets by mouth daily.    Dispense:  180 tablet    Refill:  1   oxybutynin (DITROPAN XL) 5 MG 24 hr tablet    Sig: Take 1 tablet (5 mg total) by mouth at bedtime.    Dispense:  90 tablet    Refill:  0    Follow-up: Return in about 3 months (around 07/05/2021) for medication f/u.    Kerin Perna, NP

## 2021-07-05 ENCOUNTER — Ambulatory Visit (INDEPENDENT_AMBULATORY_CARE_PROVIDER_SITE_OTHER): Payer: BC Managed Care – PPO | Admitting: Primary Care

## 2021-10-01 ENCOUNTER — Other Ambulatory Visit (INDEPENDENT_AMBULATORY_CARE_PROVIDER_SITE_OTHER): Payer: Self-pay | Admitting: Primary Care

## 2021-10-01 DIAGNOSIS — R062 Wheezing: Secondary | ICD-10-CM

## 2021-10-01 DIAGNOSIS — I10 Essential (primary) hypertension: Secondary | ICD-10-CM

## 2021-10-02 NOTE — Telephone Encounter (Signed)
Sent to PCP ?

## 2021-10-17 ENCOUNTER — Ambulatory Visit (INDEPENDENT_AMBULATORY_CARE_PROVIDER_SITE_OTHER): Payer: BC Managed Care – PPO | Admitting: Primary Care

## 2021-10-17 ENCOUNTER — Encounter (INDEPENDENT_AMBULATORY_CARE_PROVIDER_SITE_OTHER): Payer: Self-pay | Admitting: Primary Care

## 2021-10-17 VITALS — BP 137/92 | HR 90 | Temp 98.0°F | Ht 63.0 in | Wt 210.6 lb

## 2021-10-17 DIAGNOSIS — Z131 Encounter for screening for diabetes mellitus: Secondary | ICD-10-CM | POA: Diagnosis not present

## 2021-10-17 DIAGNOSIS — E782 Mixed hyperlipidemia: Secondary | ICD-10-CM | POA: Diagnosis not present

## 2021-10-17 DIAGNOSIS — Z23 Encounter for immunization: Secondary | ICD-10-CM

## 2021-10-17 DIAGNOSIS — F17210 Nicotine dependence, cigarettes, uncomplicated: Secondary | ICD-10-CM | POA: Diagnosis not present

## 2021-10-17 DIAGNOSIS — I10 Essential (primary) hypertension: Secondary | ICD-10-CM

## 2021-10-17 DIAGNOSIS — Z760 Encounter for issue of repeat prescription: Secondary | ICD-10-CM

## 2021-10-17 DIAGNOSIS — F17200 Nicotine dependence, unspecified, uncomplicated: Secondary | ICD-10-CM

## 2021-10-17 DIAGNOSIS — Z6837 Body mass index (BMI) 37.0-37.9, adult: Secondary | ICD-10-CM

## 2021-10-17 LAB — POCT GLYCOSYLATED HEMOGLOBIN (HGB A1C): Hemoglobin A1C: 5.6 % (ref 4.0–5.6)

## 2021-10-17 MED ORDER — AMLODIPINE BESYLATE 10 MG PO TABS: ORAL_TABLET | ORAL | 1 refills | Status: DC

## 2021-10-17 MED ORDER — LISINOPRIL-HYDROCHLOROTHIAZIDE 20-12.5 MG PO TABS: 2.0000 | ORAL_TABLET | Freq: Every day | ORAL | 1 refills | Status: DC

## 2021-10-17 NOTE — Progress Notes (Signed)
?[place] ? ?Carolyn Espinoza, is a 56 y.o. female ? ?DZH:[id number] ? ?[id number] ? ?DOB - 10/06/1965 ? ?No chief complaint on file. ?    ? ?Subjective:  ? ?Carolyn Espinoza is a 56 y.o. female here today for a follow up visit. Patient has No headache, No chest pain, No abdominal pain - No Nausea, No new weakness tingling or numbness, No Cough - shortness of breath ? ?No problems updated. ? ?No Known Allergies ? ?Past Medical History:  ?Diagnosis Date  ? HTN (hypertension) 2012  ? ? ?Current Outpatient Medications on File Prior to Visit  ?Medication Sig Dispense Refill  ? Blood Pressure KIT 1 Bag by Does not apply route 3 (three) times daily. 1 kit 0  ? oxybutynin (DITROPAN XL) 5 MG 24 hr tablet Take 1 tablet (5 mg total) by mouth at bedtime. 90 tablet 0  ? albuterol (VENTOLIN HFA) 108 (90 Base) MCG/ACT inhaler INHALE 2 PUFFS INTO THE LUNGS EVERY 4 HOURS AS NEEDED FOR WHEEZING OR SHORTNESS OF BREATH 6.7 g 0  ? Multiple Vitamin (MULTIVITAMIN WITH MINERALS) TABS tablet Take 1 tablet by mouth daily.    ? ?No current facility-administered medications on file prior to visit.  ? ? ?Objective:  ? ?Vitals:  ? 10/17/21 1423 10/17/21 1434  ?BP: (!) 148/109 (!) 137/92  ?Pulse: 93 90  ?Temp: 98 ?F (36.7 ?C)   ?TempSrc: Oral   ?SpO2: 95%   ?Weight: 210 lb 9.6 oz (95.5 kg)   ?Height: 5' 3"  (1.6 m)   ? ? ?Exam ?General appearance : Awake, alert, not in any distress. Speech Clear. Not toxic looking ?HEENT: Atraumatic and Normocephalic, pupils equally reactive to light and accomodation ?Neck: Supple, no JVD. No cervical lymphadenopathy.  ?Chest: Good air entry bilaterally, no added sounds  ?CVS: S1 S2 regular, no murmurs.  ?Abdomen: Bowel sounds present, Non tender and not distended with no gaurding, rigidity or rebound. ?Extremities: B/L Lower Ext shows no edema, both legs are warm to touch ?Neurology: Awake alert, and oriented X 3, CN II-XII intact, Non focal ?Skin: No Rash ? ?Data Review ?Lab Results  ?Component Value  Date  ? HGBA1C 5.6 10/17/2021  ? HGBA1C 5.2 03/31/2018  ? ? ?Assessment & Plan  ? ?Diagnoses and all orders for this visit: ? ?Hypertension, unspecified type ?BP goal - < 130/80 ?Explained that having normal blood pressure is the goal and medications are helping to get to goal and maintain normal blood pressure. ?DIET: Limit salt intake, read nutrition labels to check salt content, limit fried and high fatty foods  ?Avoid using multisymptom OTC cold preparations that generally contain sudafed which can rise BP. Consult with pharmacist on best cold relief products to use for persons with HTN ?EXERCISE ?Discussed incorporating exercise such as walking - 30 minutes most days of the week and can do in 10 minute intervals    ? ?Screening for diabetes mellitus ?-     HgB A1c 5.6 Discuss 5.7-6.4 prediabetes  Monitor foods that are high in carbohydrates are the following rice, potatoes, breads, sugars, and pastas.  Reduction in the intake (eating) will assist in lowering your blood sugars.  Loose weight  ?-     CBC with Differential; Future ? ?Hypertension, unspecified type ?BP goal - < 130/80 ?Explained that having normal blood pressure is the goal and medications are helping to get to goal and maintain normal blood pressure. ?DIET: Limit salt intake, read nutrition labels to check salt content, limit fried and high  fatty foods  ?Avoid using multisymptom OTC cold preparations that generally contain sudafed which can rise BP. Consult with pharmacist on best cold relief products to use for persons with HTN ?EXERCISE ?Discussed incorporating exercise such as walking - 30 minutes most days of the week and can do in 10 minute intervals    ?-     CMP14+EGFR; Future ? ?Medication refill ?-     amLODipine (NORVASC) 10 MG tablet; TAKE 1 TABLET(10 MG) BY MOUTH DAILY ?-     lisinopril-hydrochlorothiazide (ZESTORETIC) 20-12.5 MG tablet; Take 2 tablets by mouth daily. ? ?Mixed hyperlipidemia ? Healthy lifestyle diet of fruits  vegetables fish nuts whole grains and low saturated fat . Foods high in cholesterol or liver, fatty meats,cheese, butter avocados, nuts and seeds, chocolate and fried foods. ? ?-     Lipid Panel; Future ? ?Tobacco use disorder ?- I have recommended complete cessation of tobacco use. I have discussed various options available for assistance with tobacco cessation including over the counter methods (Nicotine gum, patch and lozenges). We also discussed prescription options (Chantix, Nicotine Inhaler / Nasal Spray). The patient is not interested in pursuing any prescription tobacco cessation options at this time. ?- Patient declines at this time.  ?- Less than 5 minutes spent on counseling.  ?  ?Patient have been counseled extensively about nutrition and exercise. Other issues discussed during this visit include: low cholesterol diet, weight control and daily exercise, foot care, annual eye examinations at Ophthalmology, importance of adherence with medications and regular follow-up. We also discussed long term complications of uncontrolled diabetes and hypertension.  ? ?Class 2 severe obesity due to excess calories with serious comorbidity in adult, unspecified BMI ([place]) ?Obesity is 30-39 indicating an excess in caloric intake or underlining conditions. This may lead to other co-morbidities. Lifestyle modifications of diet and exercise may reduce obesity.  ?Return in about 4 weeks (around 11/14/2021) for BP/labs. ? ?The patient was given clear instructions to go to ER or return to medical center if symptoms don't improve, worsen or new problems develop. The patient verbalized understanding. The patient was told to call to get lab results if they haven't heard anything in the next week.  ? ?This note has been created with [employer], quantity. Any transcriptional errors are unintentional.  ? ?Kerin Perna, NP ?10/17/2021, 2:58 PM  ?

## 2021-10-17 NOTE — Patient Instructions (Signed)
Influenza, Adult °Influenza is also called "the flu." It is an infection in the lungs, nose, and throat (respiratory tract). It spreads easily from person to person (is contagious). The flu causes symptoms that are like a cold, along with high fever and body aches. °What are the causes? °This condition is caused by the influenza virus. You can get the virus by: °Breathing in droplets that are in the air after a person infected with the flu coughed or sneezed. °Touching something that has the virus on it and then touching your mouth, nose, or eyes. °What increases the risk? °Certain things may make you more likely to get the flu. These include: °Not washing your hands often. °Having close contact with many people during cold and flu season. °Touching your mouth, eyes, or nose without first washing your hands. °Not getting a flu shot every year. °You may have a higher risk for the flu, and serious problems, such as a lung infection (pneumonia), if you: °Are older than 65. °Are pregnant. °Have a weakened disease-fighting system (immune system) because of a disease or because you are taking certain medicines. °Have a long-term (chronic) condition, such as: °Heart, kidney, or lung disease. °Diabetes. °Asthma. °Have a liver disorder. °Are very overweight (morbidly obese). °Have anemia. °What are the signs or symptoms? °Symptoms usually begin suddenly and last 4-14 days. They may include: °Fever and chills. °Headaches, body aches, or muscle aches. °Sore throat. °Cough. °Runny or stuffy (congested) nose. °Feeling discomfort in your chest. °Not wanting to eat as much as normal. °Feeling weak or tired. °Feeling dizzy. °Feeling sick to your stomach or throwing up. °How is this treated? °If the flu is found early, you can be treated with antiviral medicine. This can help to reduce how bad the illness is and how long it lasts. This may be given by mouth or through an IV tube. °Taking care of yourself at home can help your  symptoms get better. Your doctor may want you to: °Take over-the-counter medicines. °Drink plenty of fluids. °The flu often goes away on its own. If you have very bad symptoms or other problems, you may be treated in a hospital. °Follow these instructions at home: °  °Activity °Rest as needed. Get plenty of sleep. °Stay home from work or school as told by your doctor. °Do not leave home until you do not have a fever for 24 hours without taking medicine. °Leave home only to go to your doctor. °Eating and drinking °Take an ORS (oral rehydration solution). This is a drink that is sold at pharmacies and stores. °Drink enough fluid to keep your pee pale yellow. °Drink clear fluids in small amounts as you are able. Clear fluids include: °Water. °Ice chips. °Fruit juice mixed with water. °Low-calorie sports drinks. °Eat bland foods that are easy to digest. Eat small amounts as you are able. These foods include: °Bananas. °Applesauce. °Rice. °Lean meats. °Toast. °Crackers. °Do not eat or drink: °Fluids that have a lot of sugar or caffeine. °Alcohol. °Spicy or fatty foods. °General instructions °Take over-the-counter and prescription medicines only as told by your doctor. °Use a cool mist humidifier to add moisture to the air in your home. This can make it easier for you to breathe. °When using a cool mist humidifier, clean it daily. Empty water and replace with clean water. °Cover your mouth and nose when you cough or sneeze. °Wash your hands with soap and water often and for at least 20 seconds. This is also important after   you cough or sneeze. If you cannot use soap and water, use alcohol-based hand sanitizer. °Keep all follow-up visits. °How is this prevented? ° °Get a flu shot every year. You may get the flu shot in late summer, fall, or winter. Ask your doctor when you should get your flu shot. °Avoid contact with people who are sick during fall and winter. This is cold and flu season. °Contact a doctor if: °You get  new symptoms. °You have: °Chest pain. °Watery poop (diarrhea). °A fever. °Your cough gets worse. °You start to have more mucus. °You feel sick to your stomach. °You throw up. °Get help right away if you: °Have shortness of breath. °Have trouble breathing. °Have skin or nails that turn a bluish color. °Have very bad pain or stiffness in your neck. °Get a sudden headache. °Get sudden pain in your face or ear. °Cannot eat or drink without throwing up. °These symptoms may represent a serious problem that is an emergency. Get medical help right away. Call your local emergency services (911 in the [place] °Do not wait to see if the symptoms will go away. °Do not drive yourself to the hospital. °Summary °Influenza is also called "the flu." It is an infection in the lungs, nose, and throat. It spreads easily from person to person. °Take over-the-counter and prescription medicines only as told by your doctor. °Getting a flu shot every year is the best way to not get the flu. °This information is not intended to replace advice given to you by your health care provider. Make sure you discuss any questions you have with your health care provider. °Document Revised: 02/25/2020 Document Reviewed: 02/25/2020 °Elsevier Patient Education © 2022 [employer] ° °

## 2021-11-22 ENCOUNTER — Encounter (INDEPENDENT_AMBULATORY_CARE_PROVIDER_SITE_OTHER): Payer: Self-pay

## 2021-11-22 ENCOUNTER — Ambulatory Visit (INDEPENDENT_AMBULATORY_CARE_PROVIDER_SITE_OTHER): Payer: BC Managed Care – PPO | Admitting: Primary Care

## 2022-01-28 ENCOUNTER — Encounter (HOSPITAL_COMMUNITY): Payer: Self-pay | Admitting: *Deleted

## 2022-01-28 ENCOUNTER — Ambulatory Visit (HOSPITAL_COMMUNITY)
Admission: EM | Admit: 2022-01-28 | Discharge: 2022-01-28 | Disposition: A | Discharge status: Home / Self Care | Payer: BC Managed Care – PPO | Attending: Urgent Care | Admitting: Urgent Care

## 2022-01-28 ENCOUNTER — Other Ambulatory Visit: Payer: Self-pay

## 2022-01-28 DIAGNOSIS — L03116 Cellulitis of left lower limb: Secondary | ICD-10-CM | POA: Diagnosis not present

## 2022-01-28 DIAGNOSIS — S80862A Insect bite (nonvenomous), left lower leg, initial encounter: Secondary | ICD-10-CM | POA: Diagnosis not present

## 2022-01-28 DIAGNOSIS — W57XXXA Bitten or stung by nonvenomous insect and other nonvenomous arthropods, initial encounter: Secondary | ICD-10-CM | POA: Diagnosis not present

## 2022-01-28 MED ORDER — TRIAMCINOLONE ACETONIDE 0.1 % EX CREA: 1.0000 | TOPICAL_CREAM | Freq: Two times a day (BID) | CUTANEOUS | 0 refills | Status: DC

## 2022-01-28 MED ORDER — CEPHALEXIN 500 MG PO CAPS: 500.0000 mg | ORAL_CAPSULE | Freq: Four times a day (QID) | ORAL | 0 refills | Status: AC

## 2022-01-28 NOTE — ED Provider Notes (Signed)
[place]    CSN: [id number] Arrival date & time: 01/28/22  5916      History   Chief Complaint Chief Complaint  Patient presents with   Blister    HPI Carolyn Espinoza is a 56 y.o. female.   Pleasant 56 year old female presents today with concerns of an infected mosquito bite on her left lower extremity.  She states the bite occurred on Wednesday.  Is been extremely pruritic, and has been itching excessively.  She states she woke up today, noticed that the skin was very warm and red.  She denies any fever.  She has not tried any treatments.  She denies any systemic symptoms.    Past Medical History:  Diagnosis Date   HTN (hypertension) 2012    Patient Active Problem List   Diagnosis Date Noted   CHALAZION, LEFT 10/06/2009   FURUNCLE 05/16/2009   PERIMENOPAUSAL SYNDROME 01/31/2009   CARPAL TUNNEL SYNDROME, RIGHT 10/20/2008   ULNAR NEUROPATHY, RIGHT 10/20/2008   TROCHANTERIC BURSITIS, RIGHT 10/20/2008   ELEVATED BLOOD PRESSURE WITHOUT DIAGNOSIS OF HYPERTENSION 10/20/2008   FIBROIDS, UTERUS 07/22/1993    Past Surgical History:  Procedure Laterality Date   COLONOSCOPY     DENTAL SURGERY  2003   multiple teeth removed   POLYPECTOMY      OB History   No obstetric history on file.      Home Medications    Prior to Admission medications   Medication Sig Start Date End Date Taking? Authorizing Provider  cephALEXin (KEFLEX) 500 MG capsule Take 1 capsule (500 mg total) by mouth 4 (four) times daily for 7 days. 01/28/22 02/04/22 Yes Kanishk Stroebel L, PA  triamcinolone cream (KENALOG) 0.1 % Apply 1 Application topically 2 (two) times daily. 01/28/22  Yes Allysen Lazo L, PA  albuterol (VENTOLIN HFA) 108 (90 Base) MCG/ACT inhaler INHALE 2 PUFFS INTO THE LUNGS EVERY 4 HOURS AS NEEDED FOR WHEEZING OR SHORTNESS OF BREATH 10/02/21   Kerin Perna, NP  amLODipine (NORVASC) 10 MG tablet TAKE 1 TABLET(10 MG) BY MOUTH DAILY 10/17/21   Kerin Perna, NP   Blood Pressure KIT 1 Bag by Does not apply route 3 (three) times daily. 05/03/19   Kerin Perna, NP  lisinopril-hydrochlorothiazide (ZESTORETIC) 20-12.5 MG tablet Take 2 tablets by mouth daily. 10/17/21   Kerin Perna, NP  Multiple Vitamin (MULTIVITAMIN WITH MINERALS) TABS tablet Take 1 tablet by mouth daily.    [provider]  oxybutynin (DITROPAN XL) 5 MG 24 hr tablet Take 1 tablet (5 mg total) by mouth at bedtime. 04/05/21   Kerin Perna, NP    Family History Family History  Problem Relation Age of Onset   Cancer Father    Hypertension Father    Prostate cancer Father    COPD Father    Heart failure Mother    Diabetes Mother    Hypertension Mother    Kidney disease Mother    Colon polyps Sister    Colon cancer Neg Hx    Esophageal cancer Neg Hx    Stomach cancer Neg Hx    Rectal cancer Neg Hx     Social History Social History   Tobacco Use   Smoking status: Every Day    Packs/day: 0.50    Years: 30.00    Total pack years: 15.00    Types: Cigarettes   Smokeless tobacco: Never  Vaping Use   Vaping Use: Never used  Substance Use Topics   Alcohol use:  Yes    Comment: occasional   Drug use: No     Allergies   Patient has no known allergies.   Review of Systems Review of Systems  Skin:  Positive for rash (insect bite with surrounding erythema).  All other systems reviewed and are negative.    Physical Exam Triage Vital Signs ED Triage Vitals  Enc Vitals Group     BP 01/28/22 0912 (!) 158/88     Pulse Rate 01/28/22 0912 66     Resp 01/28/22 0912 18     Temp 01/28/22 0912 98.3 F (36.8 C)     Temp src --      SpO2 01/28/22 0912 94 %     Weight --      Height --      Head Circumference --      Peak Flow --      Pain Score 01/28/22 0910 6     Pain Loc --      Pain Edu? --      Excl. in [place]? --    No data found.  Updated Vital Signs BP (!) 158/88   Pulse 66   Temp 98.3 F (36.8 C)   Resp 18   LMP 02/11/2016    SpO2 94%   Visual Acuity Right Eye Distance:   Left Eye Distance:   Bilateral Distance:    Right Eye Near:   Left Eye Near:    Bilateral Near:     Physical Exam Vitals and nursing note reviewed.  Constitutional:      General: She is not in acute distress.    Appearance: Normal appearance. She is obese. She is not ill-appearing, toxic-appearing or diaphoretic.  HENT:     Head: Normocephalic and atraumatic.  Musculoskeletal:        General: Swelling and tenderness (to affected area of skin) present. Normal range of motion.  Lymphadenopathy:     Cervical: No cervical adenopathy.  Skin:    General: Skin is warm.     Capillary Refill: Capillary refill takes less than 2 seconds.     Findings: Erythema (left medial calf proximally) and lesion (blister, central to erythema) present.  Neurological:     General: No focal deficit present.     Mental Status: She is alert and oriented to person, place, and time.      UC Treatments / Results  Labs (all labs ordered are listed, but only abnormal results are displayed) Labs Reviewed - No data to display  EKG   Radiology No results found.  Procedures Procedures (including critical care time)  Medications Ordered in UC Medications - No data to display  Initial Impression / Assessment and Plan / UC Course  I have reviewed the triage vital signs and the nursing notes.  Pertinent labs & imaging results that were available during my care of the patient were reviewed by me and considered in my medical decision making (see chart for details).     Cellulitis of left lower extremity (calf) - secondary to insect bite. Will start PO keflex QID Insect bite - trial of topical triamcinolone secondary to pruritus of area. RTC precautions discussed   Final Clinical Impressions(s) / UC Diagnoses   Final diagnoses:  Cellulitis of leg, left  Insect bite of left lower leg, initial encounter     Discharge Instructions      Start  taking the oral antibiotic 4 times daily until gone.  Monitor for any spreading of the redness warmth  or swelling. Use the topical steroid cream twice daily to help with the itching.  Do not occlude the ointment, do not use longer than 7 days. If any spreading of the rash, fever, or systemic symptoms develop, please return to the clinic or head to the ER.   ED Prescriptions     Medication Sig Dispense Auth. Provider   triamcinolone cream (KENALOG) 0.1 % Apply 1 Application topically 2 (two) times daily. 15 g Crain, Whitney L, PA   cephALEXin (KEFLEX) 500 MG capsule Take 1 capsule (500 mg total) by mouth 4 (four) times daily for 7 days. 28 capsule Crain, Whitney L, PA      PDMP not reviewed this encounter.   Chaney Malling, [place] 01/28/22 1954

## 2022-01-28 NOTE — Discharge Instructions (Addendum)
Start taking the oral antibiotic 4 times daily until gone.  Monitor for any spreading of the redness warmth or swelling. Use the topical steroid cream twice daily to help with the itching.  Do not occlude the ointment, do not use longer than 7 days. If any spreading of the rash, fever, or systemic symptoms develop, please return to the clinic or head to the ER.

## 2022-01-28 NOTE — ED Triage Notes (Signed)
Pt reports a possible insect bite on Wednesday. Pt saw a blister on Lt leg just below the knee. Pt reports she popped the blister.

## 2022-03-01 ENCOUNTER — Encounter (INDEPENDENT_AMBULATORY_CARE_PROVIDER_SITE_OTHER): Payer: Self-pay | Admitting: Primary Care

## 2022-03-01 ENCOUNTER — Ambulatory Visit (INDEPENDENT_AMBULATORY_CARE_PROVIDER_SITE_OTHER): Payer: BC Managed Care – PPO | Admitting: Primary Care

## 2022-03-01 VITALS — BP 142/89 | HR 70 | Temp 98.0°F | Ht 63.0 in | Wt 219.8 lb

## 2022-03-01 DIAGNOSIS — Z131 Encounter for screening for diabetes mellitus: Secondary | ICD-10-CM

## 2022-03-01 DIAGNOSIS — Z23 Encounter for immunization: Secondary | ICD-10-CM

## 2022-03-01 DIAGNOSIS — Z6838 Body mass index (BMI) 38.0-38.9, adult: Secondary | ICD-10-CM

## 2022-03-01 DIAGNOSIS — E782 Mixed hyperlipidemia: Secondary | ICD-10-CM | POA: Diagnosis not present

## 2022-03-01 DIAGNOSIS — I10 Essential (primary) hypertension: Secondary | ICD-10-CM

## 2022-03-01 NOTE — Patient Instructions (Signed)
Recombinant Zoster (Shingles) Vaccine: What You Need to Know 1. Why get vaccinated? Recombinant zoster (shingles) vaccine can prevent shingles. Shingles (also called herpes zoster, or just zoster) is a painful skin rash, usually with blisters. In addition to the rash, shingles can cause fever, headache, chills, or upset stomach. Rarely, shingles can lead to complications such as pneumonia, hearing problems, blindness, brain inflammation (encephalitis), or death. The risk of shingles increases with age. The most common complication of shingles is long-term nerve pain called postherpetic neuralgia (PHN). PHN occurs in the areas where the shingles rash was and can last for months or years after the rash goes away. The pain from PHN can be severe and debilitating. The risk of PHN increases with age. An older adult with shingles is more likely to develop PHN and have longer lasting and more severe pain than a younger person. People with weakened immune systems also have a higher risk of getting shingles and complications from the disease. Shingles is caused by varicella-zoster virus, the same virus that causes chickenpox. After you have chickenpox, the virus stays in your body and can cause shingles later in life. Shingles cannot be passed from one person to another, but the virus that causes shingles can spread and cause chickenpox in someone who has never had chickenpox or has never received chickenpox vaccine. 2. Recombinant shingles vaccine Recombinant shingles vaccine provides strong protection against shingles. By preventing shingles, recombinant shingles vaccine also protects against PHN and other complications. Recombinant shingles vaccine is recommended for: Adults 68 years and older Adults 19 years and older who have a weakened immune system because of disease or treatments Shingles vaccine is given as a two-dose series. For most people, the second dose should be given 2 to 6 months after the first  dose. Some people who have or will have a weakened immune system can get the second dose 1 to 2 months after the first dose. Ask your health care provider for guidance. People who have had shingles in the past and people who have received varicella (chickenpox) vaccine are recommended to get recombinant shingles vaccine. The vaccine is also recommended for people who have already gotten another type of shingles vaccine, the live shingles vaccine. There is no live virus in recombinant shingles vaccine. Shingles vaccine may be given at the same time as other vaccines. 3. Talk with your health care provider Tell your vaccination provider if the person getting the vaccine: Has had an allergic reaction after a previous dose of recombinant shingles vaccine, or has any severe, life-threatening allergies Is currently experiencing an episode of shingles Is pregnant In some cases, your health care provider may decide to postpone shingles vaccination until a future visit. People with minor illnesses, such as a cold, may be vaccinated. People who are moderately or severely ill should usually wait until they recover before getting recombinant shingles vaccine. Your health care provider can give you more information. 4. Risks of a vaccine reaction A sore arm with mild or moderate pain is very common after recombinant shingles vaccine. Redness and swelling can also happen at the site of the injection. Tiredness, muscle pain, headache, shivering, fever, stomach pain, and nausea are common after recombinant shingles vaccine. These side effects may temporarily prevent a vaccinated person from doing regular activities. Symptoms usually go away on their own in 2 to 3 days. You should still get the second dose of recombinant shingles vaccine even if you had one of these reactions after the first dose. Guillain-Barr  syndrome (GBS), a serious nervous system disorder, has been reported very rarely after recombinant zoster  vaccine. People sometimes faint after medical procedures, including vaccination. Tell your provider if you feel dizzy or have vision changes or ringing in the ears. As with any medicine, there is a very remote chance of a vaccine causing a severe allergic reaction, other serious injury, or death. 5. What if there is a serious problem? An allergic reaction could occur after the vaccinated person leaves the clinic. If you see signs of a severe allergic reaction (hives, swelling of the face and throat, difficulty breathing, a fast heartbeat, dizziness, or weakness), call 9-1-1 and get the person to the nearest hospital. For other signs that concern you, call your health care provider. Adverse reactions should be reported to the Vaccine Adverse Event Reporting System (VAERS). Your health care provider will usually file this report, or you can do it yourself. Visit the VAERS website at www.vaers.SamedayNews.es or call 850-694-3705. VAERS is only for reporting reactions, and VAERS staff members do not give medical advice. 6. How can I learn more? Ask your health care provider. Call your local or state health department. Visit the website of the Food and Drug Administration (FDA) for vaccine package inserts and additional information at http://lopez-wang.org/. Contact the Centers for Disease Control and Prevention (CDC): Call (931)393-7660 (1-800-CDC-INFO) or Visit CDC's website at http://hunter.com/. Source: CDC Vaccine Information Statement Recombinant Zoster Vaccine (08/25/2020) This same material is available at http://www.wolf.info/ for no charge. This information is not intended to replace advice given to you by your health care provider. Make sure you discuss any questions you have with your health care provider. Document Revised: 06/06/2021 Document Reviewed: 09/08/2020 Elsevier Patient Education  [place].

## 2022-03-02 LAB — CBC WITH DIFFERENTIAL/PLATELET
Basophils Absolute: 0 10*3/uL (ref 0.0–0.2)
Basos: 1 %
EOS (ABSOLUTE): 0 10*3/uL (ref 0.0–0.4)
Eos: 1 %
Hematocrit: 48.3 % — ABNORMAL HIGH (ref 34.0–46.6)
Hemoglobin: 16.1 g/dL — ABNORMAL HIGH (ref 11.1–15.9)
Immature Grans (Abs): 0 10*3/uL (ref 0.0–0.1)
Immature Granulocytes: 0 %
Lymphocytes Absolute: 2.5 10*3/uL (ref 0.7–3.1)
Lymphs: 30 %
MCH: 30 pg (ref 26.6–33.0)
MCHC: 33.3 g/dL (ref 31.5–35.7)
MCV: 90 fL (ref 79–97)
Monocytes Absolute: 0.4 10*3/uL (ref 0.1–0.9)
Monocytes: 5 %
Neutrophils Absolute: 5.2 10*3/uL (ref 1.4–7.0)
Neutrophils: 63 %
Platelets: 196 10*3/uL (ref 150–450)
RBC: 5.37 x10E6/uL — ABNORMAL HIGH (ref 3.77–5.28)
RDW: 13 % (ref 11.7–15.4)
WBC: 8.1 10*3/uL (ref 3.4–10.8)

## 2022-03-02 LAB — CMP14+EGFR
ALT: 12 IU/L (ref 0–32)
AST: 13 IU/L (ref 0–40)
Albumin/Globulin Ratio: 1.2 (ref 1.2–2.2)
Albumin: 4.3 g/dL (ref 3.8–4.9)
Alkaline Phosphatase: 71 IU/L (ref 44–121)
BUN/Creatinine Ratio: 11 (ref 9–23)
BUN: 9 mg/dL (ref 6–24)
Bilirubin Total: 0.4 mg/dL (ref 0.0–1.2)
CO2: 24 mmol/L (ref 20–29)
Calcium: 9.7 mg/dL (ref 8.7–10.2)
Chloride: 101 mmol/L (ref 96–106)
Creatinine, Ser: 0.83 mg/dL (ref 0.57–1.00)
Globulin, Total: 3.7 g/dL (ref 1.5–4.5)
Glucose: 79 mg/dL (ref 70–99)
Potassium: 4.8 mmol/L (ref 3.5–5.2)
Sodium: 140 mmol/L (ref 134–144)
Total Protein: 8 g/dL (ref 6.0–8.5)
eGFR: 82 mL/min/{1.73_m2} (ref >59)

## 2022-03-02 LAB — LIPID PANEL
Chol/HDL Ratio: 3.2 ratio (ref 0.0–4.4)
Cholesterol, Total: 161 mg/dL (ref 100–199)
HDL: 50 mg/dL (ref >39)
LDL Chol Calc (NIH): 91 mg/dL (ref 0–99)
Triglycerides: 108 mg/dL (ref 0–149)
VLDL Cholesterol Cal: 20 mg/dL (ref 5–40)

## 2022-03-03 NOTE — Progress Notes (Signed)
Carolyn Espinoza is a 56 y.o. female presents for hypertension evaluation, Denies shortness of breath, headaches, chest pain or lower extremity edema, sudden onset, vision changes, unilateral weakness, dizziness, paresthesias.  Patient did express carrying weight in midsection demonstrated how she could tighten her core with chair exercises and leg exercises.  Patient reports adherence with medications.  Dietary habits include: Monitors her sodium and reads labels on food Exercise habits include: Walking Family / Social history: Mother hypertension and diabetes/father hypertension   Past Medical History:  Diagnosis Date   HTN (hypertension) 2012   Past Surgical History:  Procedure Laterality Date   COLONOSCOPY     DENTAL SURGERY  2003   multiple teeth removed   POLYPECTOMY     No Known Allergies Current Outpatient Medications on File Prior to Visit  Medication Sig Dispense Refill   albuterol (VENTOLIN HFA) 108 (90 Base) MCG/ACT inhaler INHALE 2 PUFFS INTO THE LUNGS EVERY 4 HOURS AS NEEDED FOR WHEEZING OR SHORTNESS OF BREATH 6.7 g 0   amLODipine (NORVASC) 10 MG tablet TAKE 1 TABLET(10 MG) BY MOUTH DAILY 90 tablet 1   Blood Pressure KIT 1 Bag by Does not apply route 3 (three) times daily. 1 kit 0   lisinopril-hydrochlorothiazide (ZESTORETIC) 20-12.5 MG tablet Take 2 tablets by mouth daily. 180 tablet 1   Multiple Vitamin (MULTIVITAMIN WITH MINERALS) TABS tablet Take 1 tablet by mouth daily.     oxybutynin (DITROPAN XL) 5 MG 24 hr tablet Take 1 tablet (5 mg total) by mouth at bedtime. 90 tablet 0   triamcinolone cream (KENALOG) 0.1 % Apply 1 Application topically 2 (two) times daily. 15 g 0   No current facility-administered medications on file prior to visit.   Social History   Socioeconomic History   Marital status: Single    Spouse name: Not on file   Number of children: Not on file   Years of education: Not on file   Highest education level:  Not on file  Occupational History   Not on file  Tobacco Use   Smoking status: Every Day    Packs/day: 0.50    Years: 30.00    Total pack years: 15.00    Types: Cigarettes   Smokeless tobacco: Never  Vaping Use   Vaping Use: Never used  Substance and Sexual Activity   Alcohol use: Yes    Comment: occasional   Drug use: No   Sexual activity: Yes  Other Topics Concern   Not on file  Social History Narrative   Patient lives alone.   Comes to appt by bus.   Does not exercise regularly.   Smokes cigarettes.   No recreational drug use.   Drinks 1 beer or liquor drink per day.         Social Determinants of Health   Financial Resource Strain: Not on file  Food Insecurity: Not on file  Transportation Needs: Not on file  Physical Activity: Not on file  Stress: Not on file  Social Connections: Not on file  Intimate Partner Violence: Not on file   Family History  Problem Relation Age of Onset   Cancer Father    Hypertension Father    Prostate cancer Father    COPD Father    Heart failure Mother    Diabetes Mother    Hypertension Mother    Kidney disease Mother    Colon polyps Sister    Colon cancer Neg Hx    Esophageal  cancer Neg Hx    Stomach cancer Neg Hx    Rectal cancer Neg Hx      OBJECTIVE:  Vitals:   03/01/22 1015  BP: (!) 142/89  Pulse: 70  Temp: 98 F (36.7 C)  TempSrc: Oral  SpO2: 94%  Weight: 219 lb 12.8 oz (99.7 kg)  Height: $Remove'5\' 3"'oiFplqx$  (1.6 m)    Physical Exam Vitals reviewed.  Constitutional:      Appearance: She is obese.  HENT:     Head: Normocephalic.     Right Ear: Tympanic membrane and external ear normal.     Left Ear: Tympanic membrane and external ear normal.  Eyes:     Extraocular Movements: Extraocular movements intact.     Pupils: Pupils are equal, round, and reactive to light.  Cardiovascular:     Rate and Rhythm: Normal rate and regular rhythm.  Pulmonary:     Effort: Pulmonary effort is normal.     Breath sounds: Normal  breath sounds.  Abdominal:     General: Bowel sounds are normal. There is distension.     Palpations: Abdomen is soft.  Musculoskeletal:        General: Normal range of motion.     Cervical back: Normal range of motion and neck supple.  Skin:    General: Skin is warm and dry.  Neurological:     Mental Status: She is alert and oriented to person, place, and time.  Psychiatric:        Mood and Affect: Mood normal.        Behavior: Behavior normal.     ROS Comprehensive ROS Pertinent positive and negative noted in HPI   Last 3 Office BP readings: BP Readings from Last 3 Encounters:  03/01/22 (!) 142/89  01/28/22 (!) 158/88  10/17/21 (!) 137/92    BMET    Component Value Date/Time   NA 140 03/01/2022 1113   K 4.8 03/01/2022 1113   CL 101 03/01/2022 1113   CO2 24 03/01/2022 1113   GLUCOSE 79 03/01/2022 1113   GLUCOSE 116 (H) 04/28/2020 1358   BUN 9 03/01/2022 1113   CREATININE 0.83 03/01/2022 1113   CALCIUM 9.7 03/01/2022 1113   GFRNONAA >60 04/28/2020 1358   GFRAA 90 05/10/2019 0855    Renal function: Estimated Creatinine Clearance: 84.2 mL/min (by C-G formula based on SCr of 0.83 mg/dL).  Clinical ASCVD: Yes  The 10-year ASCVD risk score (Arnett DK, et al., 2019) is: 13.4%   Values used to calculate the score:     Age: 36 years     Sex: Female     Is Non-Hispanic African American: Yes     Diabetic: No     Tobacco smoker: Yes     Systolic Blood Pressure: 638 mmHg     Is BP treated: Yes     HDL Cholesterol: 50 mg/dL     Total Cholesterol: 161 mg/dL  ASCVD risk factors include- [place]   ASSESSMENT & PLAN: Crystle was seen today for hypertension.  Diagnoses and all orders for this visit:  Need for shingles vaccine -     Zoster Recombinant (Shingrix )  Mixed hyperlipidemia  Healthy lifestyle diet of fruits vegetables fish nuts whole grains and low saturated fat . Foods high in cholesterol or liver, fatty meats,cheese, butter avocados, nuts and seeds,  chocolate and fried foods. -     Lipid Panel  Screening for diabetes mellitus A1c 5.6.  Discussed prediabetes is from 5.7-6.4.  Start monitoring  carbohydrates to include rice, potatoes, breads, sweets cookies and sodas,.  Recommend exercising and losing weight -     CBC with Differential   Hypertension, unspecified type -Counseled on lifestyle modifications for blood pressure control including reduced dietary sodium, increased exercise, weight reduction and adequate sleep. Also, educated patient about the risk for cardiovascular events, stroke and heart attack. Also counseled patient about the importance of medication adherence. If you participate in smoking, it is important to stop using tobacco as this will increase the risks associated with uncontrolled blood pressure.   Goal BP:  For patients younger than 60: Goal BP < 130/80. For patients 60 and older: Goal BP < 140/90. For patients with diabetes: Goal BP < 130/80. Your most recent BP: 142/89  Minimize salt intake. Minimize alcohol intake    This note has been created with Surveyor, quantity. Any transcriptional errors are unintentional.   Kerin Perna, NP 03/03/2022, 9:05 PM

## 2022-04-03 ENCOUNTER — Ambulatory Visit (INDEPENDENT_AMBULATORY_CARE_PROVIDER_SITE_OTHER): Payer: Self-pay | Admitting: *Deleted

## 2022-04-03 NOTE — Telephone Encounter (Signed)
Will forward to provider  

## 2022-04-03 NOTE — Telephone Encounter (Signed)
Called patient to discuss s/s no answer and voice box is full

## 2022-04-03 NOTE — Telephone Encounter (Signed)
Patient called and upon transfer from agent, call was disconnected/ patient not on phone. Attempted to contact patient to review sx of positive covid today and c/o sneezing and SOB. No answer from patient , voicemail box full unable to leave message. Please advise.

## 2022-04-04 ENCOUNTER — Ambulatory Visit (INDEPENDENT_AMBULATORY_CARE_PROVIDER_SITE_OTHER): Payer: Self-pay

## 2022-04-04 ENCOUNTER — Other Ambulatory Visit: Payer: Self-pay

## 2022-04-04 ENCOUNTER — Ambulatory Visit (HOSPITAL_COMMUNITY)
Admission: EM | Admit: 2022-04-04 | Discharge: 2022-04-04 | Disposition: A | Discharge status: Home / Self Care | Payer: BC Managed Care – PPO | Attending: Family Medicine | Admitting: Family Medicine

## 2022-04-04 ENCOUNTER — Encounter (HOSPITAL_COMMUNITY): Payer: Self-pay | Admitting: Emergency Medicine

## 2022-04-04 DIAGNOSIS — J4521 Mild intermittent asthma with (acute) exacerbation: Secondary | ICD-10-CM | POA: Diagnosis not present

## 2022-04-04 DIAGNOSIS — U071 COVID-19: Secondary | ICD-10-CM

## 2022-04-04 MED ORDER — NIRMATRELVIR/RITONAVIR (PAXLOVID)TABLET: ORAL_TABLET | ORAL | 0 refills | Status: DC

## 2022-04-04 MED ORDER — PREDNISONE 20 MG PO TABS: 40.0000 mg | ORAL_TABLET | Freq: Every day | ORAL | 0 refills | Status: AC

## 2022-04-04 NOTE — Telephone Encounter (Signed)
  Chief Complaint: SOB, chest pain  - Lung pain, cough Symptoms: ibid Frequency: Monday Pertinent Negatives: Patient denies fever Disposition: '[]'$ ED /'[x]'$ Urgent Care (no appt availability in office) / '[]'$ Appointment(In office/virtual)/ '[]'$  [facility]/ '[]'$ Home Care/ '[]'$ Refused Recommended Disposition /'[]'$ [facility]/ '[]'$  Follow-up with PCP Additional Notes: It was a little difficult to hear pt. PT has had intermittent chest pain and some SOB since Monday. Pt may actually be improving from earlier this week. Pt seems to have  a an upper respiratory infection from call. In reviewing chart  - pt called yesterday for COVID +. We were unable to speak with pt.    Reason for Disposition  [1] MILD difficulty breathing (e.g., minimal/no SOB at rest, SOB with walking, pulse <100) AND [2] NEW-onset or WORSE than normal  Answer Assessment - Initial Assessment Questions 1. RESPIRATORY STATUS: "Describe your breathing?" (e.g., wheezing, shortness of breath, unable to speak, severe coughing)      Coughing and wheezing 2. ONSET: "When did this breathing problem begin?"      Monday 3. PATTERN "Does the difficult breathing come and go, or has it been constant since it started?"      Come and goes 4. SEVERITY: "How bad is your breathing?" (e.g., mild, moderate, severe)    - MILD: No SOB at rest, mild SOB with walking, speaks normally in sentences, can lie down, no retractions, pulse < 100.    - MODERATE: SOB at rest, SOB with minimal exertion and prefers to sit, cannot lie down flat, speaks in phrases, mild retractions, audible wheezing, pulse 100-120.    - SEVERE: Very SOB at rest, speaks in single words, struggling to breathe, sitting hunched forward, retractions, pulse > 120      mild 5. RECURRENT SYMPTOM: "Have you had difficulty breathing before?" If Yes, ask: "When was the last time?" and "What happened that time?"      yes 6. CARDIAC HISTORY: "Do you have any history of heart disease?"  (e.g., heart attack, angina, bypass surgery, angioplasty)      no 7. LUNG HISTORY: "Do you have any history of lung disease?"  (e.g., pulmonary embolus, asthma, emphysema)     asthma 8. CAUSE: "What do you think is causing the breathing problem?"      Cold 9. OTHER SYMPTOMS: "Do you have any other symptoms? (e.g., dizziness, runny nose, cough, chest pain, fever)     Runny nose lightheaded, chest pain, cough 10. O2 SATURATION MONITOR:  "Do you use an oxygen saturation monitor (pulse oximeter) at home?" If Yes, ask: "What is your reading (oxygen level) today?" "What is your usual oxygen saturation reading?" (e.g., 95%)        11. PREGNANCY: "Is there any chance you are pregnant?" "When was your last menstrual period?"       na 12. TRAVEL: "Have you traveled out of the country in the last month?" (e.g., travel history, exposures)  Protocols used: Breathing Difficulty-A-AH

## 2022-04-04 NOTE — Telephone Encounter (Signed)
Pt is currently at the urgent care

## 2022-04-04 NOTE — Telephone Encounter (Signed)
Ms. Curless knows better chest pain is [facility]! Will tell her when better

## 2022-04-04 NOTE — ED Provider Notes (Signed)
[place]    CSN: [id number] Arrival date & time: 04/04/22  1516      History   Chief Complaint Chief Complaint  Patient presents with   Cough   Facial Pain    HPI Carolyn Espinoza is a 56 y.o. female.    Cough  Here for cough and congestion that began on September 11th.  She has had some myalgia and chills, and also a little extra wheezing.  No fever noted and no nausea or vomiting.  She maybe had some loose stools earlier in the week.  She has had a little dyspnea when she walks across the room, but otherwise has not been short of breath.  He did a home COVID test yesterday morning that was positive  Past medical history is significant for asthma and hypertension.  Last EGFR was 53  Past Medical History:  Diagnosis Date   HTN (hypertension) 2012    Patient Active Problem List   Diagnosis Date Noted   CHALAZION, LEFT 10/06/2009   FURUNCLE 05/16/2009   PERIMENOPAUSAL SYNDROME 01/31/2009   CARPAL TUNNEL SYNDROME, RIGHT 10/20/2008   ULNAR NEUROPATHY, RIGHT 10/20/2008   TROCHANTERIC BURSITIS, RIGHT 10/20/2008   ELEVATED BLOOD PRESSURE WITHOUT DIAGNOSIS OF HYPERTENSION 10/20/2008   FIBROIDS, UTERUS 07/22/1993    Past Surgical History:  Procedure Laterality Date   COLONOSCOPY     DENTAL SURGERY  2003   multiple teeth removed   POLYPECTOMY      OB History   No obstetric history on file.      Home Medications    Prior to Admission medications   Medication Sig Start Date End Date Taking? Authorizing Provider  albuterol (VENTOLIN HFA) 108 (90 Base) MCG/ACT inhaler INHALE 2 PUFFS INTO THE LUNGS EVERY 4 HOURS AS NEEDED FOR WHEEZING OR SHORTNESS OF BREATH 10/02/21  Yes Kerin Perna, NP  amLODipine (NORVASC) 10 MG tablet TAKE 1 TABLET(10 MG) BY MOUTH DAILY 10/17/21  Yes Kerin Perna, NP  lisinopril-hydrochlorothiazide (ZESTORETIC) 20-12.5 MG tablet Take 2 tablets by mouth daily. 10/17/21  Yes Kerin Perna, NP  nirmatrelvir/ritonavir  EUA (PAXLOVID) 20 x 150 MG & 10 x 100MG TABS Patient GFR is 82. Take nirmatrelvir (150 mg) two tablets twice daily for 5 days and ritonavir (100 mg) one tablet twice daily for 5 days. 04/04/22  Yes Samyiah Halvorsen, Gwenlyn Perking, MD  predniSONE (DELTASONE) 20 MG tablet Take 2 tablets (40 mg total) by mouth daily with breakfast for 5 days. 04/04/22 04/09/22 Yes BanisterGwenlyn Perking, MD  Blood Pressure KIT 1 Bag by Does not apply route 3 (three) times daily. 05/03/19   Kerin Perna, NP  Multiple Vitamin (MULTIVITAMIN WITH MINERALS) TABS tablet Take 1 tablet by mouth daily.    [provider]  triamcinolone cream (KENALOG) 0.1 % Apply 1 Application topically 2 (two) times daily. 01/28/22   Chaney Malling, PA    Family History Family History  Problem Relation Age of Onset   Cancer Father    Hypertension Father    Prostate cancer Father    COPD Father    Heart failure Mother    Diabetes Mother    Hypertension Mother    Kidney disease Mother    Colon polyps Sister    Colon cancer Neg Hx    Esophageal cancer Neg Hx    Stomach cancer Neg Hx    Rectal cancer Neg Hx     Social History Social History   Tobacco Use   Smoking  status: Every Day    Packs/day: 0.50    Years: 30.00    Total pack years: 15.00    Types: Cigarettes   Smokeless tobacco: Never  Vaping Use   Vaping Use: Never used  Substance Use Topics   Alcohol use: Yes    Comment: occasional   Drug use: No     Allergies   Patient has no known allergies.   Review of Systems Review of Systems  Respiratory:  Positive for cough.      Physical Exam Triage Vital Signs ED Triage Vitals [04/04/22 1549]  Enc Vitals Group     BP 134/87     Pulse Rate 73     Resp 20     Temp 98.6 F (37 C)     Temp Source Oral     SpO2 91 %     Weight      Height      Head Circumference      Peak Flow      Pain Score 8     Pain Loc      Pain Edu?      Excl. in [place]?    No data found.  Updated Vital Signs BP 134/87 (BP  Location: Right Arm)   Pulse 73   Temp 98.6 F (37 C) (Oral)   Resp 20   LMP 02/11/2016   SpO2 91%   Visual Acuity Right Eye Distance:   Left Eye Distance:   Bilateral Distance:    Right Eye Near:   Left Eye Near:    Bilateral Near:     Physical Exam Vitals reviewed.  Constitutional:      General: She is not in acute distress.    Appearance: She is not toxic-appearing.  HENT:     Right Ear: Tympanic membrane and ear canal normal.     Left Ear: Tympanic membrane and ear canal normal.     Nose: Congestion present.     Mouth/Throat:     Mouth: Mucous membranes are moist.     Pharynx: No oropharyngeal exudate or posterior oropharyngeal erythema.  Eyes:     Extraocular Movements: Extraocular movements intact.     Conjunctiva/sclera: Conjunctivae normal.     Pupils: Pupils are equal, round, and reactive to light.  Cardiovascular:     Rate and Rhythm: Normal rate and regular rhythm.     Heart sounds: No murmur heard. Pulmonary:     Effort: Pulmonary effort is normal. No respiratory distress.     Breath sounds: No stridor. No wheezing, rhonchi or rales.  Musculoskeletal:     Cervical back: Neck supple.  Lymphadenopathy:     Cervical: No cervical adenopathy.  Skin:    Capillary Refill: Capillary refill takes less than 2 seconds.     Coloration: Skin is not jaundiced or pale.  Neurological:     General: No focal deficit present.     Mental Status: She is alert and oriented to person, place, and time.  Psychiatric:        Behavior: Behavior normal.      UC Treatments / Results  Labs (all labs ordered are listed, but only abnormal results are displayed) Labs Reviewed - No data to display  EKG   Radiology No results found.  Procedures Procedures (including critical care time)  Medications Ordered in UC Medications - No data to display  Initial Impression / Assessment and Plan / UC Course  I have reviewed the triage vital signs and the  nursing  notes.  Pertinent labs & imaging results that were available during my care of the patient were reviewed by me and considered in my medical decision making (see chart for details).       I will send in the Paxlovid to prevent her COVID symptoms from becoming more severe.  Also I am sending in prednisone for asthma exacerbation  Final Clinical Impressions(s) / UC Diagnoses   Final diagnoses:  COVID-19  Mild intermittent asthma with exacerbation     Discharge Instructions      Take nirmatrelvir 150 mg --2 tablets twice daily for 5 days, plus also ritonavir 100 mg--1 tablet twice daily for 5 days.  These are antiviral medicines, meant to keep you from getting worse with a COVID-19 infection  Take prednisone 20 mg--2 daily for 5 days  Continue using your albuterol inhaler as needed  Quarantine through Friday, September 14; for 5 more days after that you can get out in the public, but please wear a mask.     ED Prescriptions     Medication Sig Dispense Auth. Provider   nirmatrelvir/ritonavir EUA (PAXLOVID) 20 x 150 MG & 10 x 100MG TABS Patient GFR is 82. Take nirmatrelvir (150 mg) two tablets twice daily for 5 days and ritonavir (100 mg) one tablet twice daily for 5 days. 30 tablet Lashaunta Sicard, Gwenlyn Perking, MD   predniSONE (DELTASONE) 20 MG tablet Take 2 tablets (40 mg total) by mouth daily with breakfast for 5 days. 10 tablet Windy Carina Gwenlyn Perking, MD      PDMP not reviewed this encounter.   Barrett Henle, MD 04/04/22 [phone]

## 2022-04-04 NOTE — ED Triage Notes (Signed)
Patient c/o nonproductive cough and sinus pressure x 4 days.  Patient endorses fever at home.   Patient endorses SOB upon exertion. Patient endorses loss of taste.   Patient endorses POSITIVE at home COVID test.   Patient has taken Tylenol and OTC antihistamine with some relief of symptoms.

## 2022-04-04 NOTE — Discharge Instructions (Addendum)
Take nirmatrelvir 150 mg --2 tablets twice daily for 5 days, plus also ritonavir 100 mg--1 tablet twice daily for 5 days.  These are antiviral medicines, meant to keep you from getting worse with a COVID-19 infection  Take prednisone 20 mg--2 daily for 5 days  Continue using your albuterol inhaler as needed  Quarantine through Friday, September 14; for 5 more days after that you can get out in the public, but please wear a mask.

## 2022-04-04 NOTE — Telephone Encounter (Signed)
Returned pt call pt is currently at the urgent care FYI

## 2022-06-03 ENCOUNTER — Ambulatory Visit (INDEPENDENT_AMBULATORY_CARE_PROVIDER_SITE_OTHER): Payer: BC Managed Care – PPO | Admitting: Primary Care

## 2022-08-06 ENCOUNTER — Encounter: Payer: Self-pay | Admitting: Internal Medicine

## 2022-08-20 ENCOUNTER — Encounter (INDEPENDENT_AMBULATORY_CARE_PROVIDER_SITE_OTHER): Payer: Self-pay | Admitting: Primary Care

## 2022-08-20 ENCOUNTER — Ambulatory Visit (INDEPENDENT_AMBULATORY_CARE_PROVIDER_SITE_OTHER): Payer: BC Managed Care – PPO | Admitting: Primary Care

## 2022-08-20 VITALS — BP 122/84 | HR 89 | Resp 16 | Ht 63.0 in | Wt 218.4 lb

## 2022-08-20 DIAGNOSIS — Z789 Other specified health status: Secondary | ICD-10-CM | POA: Diagnosis not present

## 2022-08-20 DIAGNOSIS — Z23 Encounter for immunization: Secondary | ICD-10-CM

## 2022-08-20 DIAGNOSIS — Z1211 Encounter for screening for malignant neoplasm of colon: Secondary | ICD-10-CM

## 2022-08-20 DIAGNOSIS — Z6838 Body mass index (BMI) 38.0-38.9, adult: Secondary | ICD-10-CM | POA: Diagnosis not present

## 2022-08-20 DIAGNOSIS — E6609 Other obesity due to excess calories: Secondary | ICD-10-CM | POA: Diagnosis not present

## 2022-08-20 DIAGNOSIS — Z1231 Encounter for screening mammogram for malignant neoplasm of breast: Secondary | ICD-10-CM

## 2022-08-20 NOTE — Progress Notes (Signed)
[place], is a 57 y.o. female  WNI:[id number]  FGH:[id number]  DOB - [birth year]  Chief Complaint  Patient presents with   Weight Management Screening       Subjective:   Carolyn Espinoza is a 57 y.o. obese female here today for concerns of her weight and would like some help with weight loss.  She has noticed shortness of breath and due to her protruding abdomen she is unable to bend and put clothes on properly.  "" Still little help as she eating candy"".  Patient has No headache, No chest pain, No abdominal pain - No Nausea, No new weakness tingling or numbness, No Cough - shortness of breath. Her   No problems updated.  No Known Allergies  Past Medical History:  Diagnosis Date   HTN (hypertension) 2012    Current Outpatient Medications on File Prior to Visit  Medication Sig Dispense Refill   albuterol (VENTOLIN HFA) 108 (90 Base) MCG/ACT inhaler INHALE 2 PUFFS INTO THE LUNGS EVERY 4 HOURS AS NEEDED FOR WHEEZING OR SHORTNESS OF BREATH 6.7 g 0   amLODipine (NORVASC) 10 MG tablet TAKE 1 TABLET(10 MG) BY MOUTH DAILY 90 tablet 1   Blood Pressure KIT 1 Bag by Does not apply route 3 (three) times daily. 1 kit 0   lisinopril-hydrochlorothiazide (ZESTORETIC) 20-12.5 MG tablet Take 2 tablets by mouth daily. 180 tablet 1   Multiple Vitamin (MULTIVITAMIN WITH MINERALS) TABS tablet Take 1 tablet by mouth daily.     nirmatrelvir/ritonavir EUA (PAXLOVID) 20 x 150 MG & 10 x '100MG'$  TABS Patient GFR is 82. Take nirmatrelvir (150 mg) two tablets twice daily for 5 days and ritonavir (100 mg) one tablet twice daily for 5 days. (Patient not taking: Reported on 08/20/2022) 30 tablet 0   triamcinolone cream (KENALOG) 0.1 % Apply 1 Application topically 2 (two) times daily. 15 g 0   No current facility-administered medications on file prior to visit.   Comprehensive ROS Pertinent positive and negative noted in HPI   Objective:   Vitals:   08/20/22 1518  BP: 122/84   Pulse: 89  Resp: 16  SpO2: 96%  Weight: 218 lb 6.4 oz (99.1 kg)  Height: '5\' 3"'$  (1.6 m)    Exam General appearance : Awake, alert, not in any distress. Speech Clear. Not toxic looking HEENT: Atraumatic and Normocephalic, pupils equally reactive to light and accomodation Neck: Supple, no JVD. No cervical lymphadenopathy.  Chest: Good air entry bilaterally, no added sounds  CVS: S1 S2 regular, no murmurs.  Abdomen: Bowel sounds present, Non tender and not distended with no gaurding, rigidity or rebound. Extremities: B/L Lower Ext shows no edema, both legs are warm to touch Neurology: Awake alert, and oriented X 3, CN II-XII intact, Non focal Skin: No Rash  Data Review Lab Results  Component Value Date   HGBA1C 5.6 10/17/2021   HGBA1C 5.2 03/31/2018    Assessment & Plan  Carolyn Espinoza was seen today for weight management screening.  Diagnoses and all orders for this visit:  Need for immunization against influenza -     Flu Vaccine QUAD 11moIM (Fluarix, Fluzone & Alfiuria Quad PF)  Advised about management of weight -     Amb Ref to Medical Weight Management  Encounter for screening mammogram for malignant neoplasm of breast -     MM DIGITAL SCREENING BILATERAL; Future  Colon cancer screening -     Ambulatory referral to Gastroenterology     Patient have been  counseled extensively about nutrition and exercise. Other issues discussed during this visit include: low cholesterol diet, weight control and daily exercise, foot care, annual eye examinations at Ophthalmology, importance of adherence with medications and regular follow-up. We also discussed long term complications of uncontrolled diabetes and hypertension.   No follow-ups on file.  The patient was given clear instructions to go to ER or return to medical center if symptoms don't improve, worsen or new problems develop. The patient verbalized understanding. The patient was told to call to get lab results if they haven't  heard anything in the next week.   This note has been created with Surveyor, quantity. Any transcriptional errors are unintentional.   Kerin Perna, NP 08/25/2022, 12:50 AM

## 2022-09-14 ENCOUNTER — Other Ambulatory Visit (INDEPENDENT_AMBULATORY_CARE_PROVIDER_SITE_OTHER): Payer: Self-pay | Admitting: Primary Care

## 2022-09-14 DIAGNOSIS — R062 Wheezing: Secondary | ICD-10-CM

## 2022-09-16 ENCOUNTER — Encounter (INDEPENDENT_AMBULATORY_CARE_PROVIDER_SITE_OTHER): Payer: Self-pay | Admitting: Family Medicine

## 2022-09-16 DIAGNOSIS — Z0289 Encounter for other administrative examinations: Secondary | ICD-10-CM

## 2022-09-16 NOTE — Telephone Encounter (Signed)
Requested Prescriptions  Pending Prescriptions Disp Refills   albuterol (VENTOLIN HFA) 108 (90 Base) MCG/ACT inhaler [Pharmacy Med Name: ALBUTEROL HFA INH (200 PUFFS) 6.7GM] 6.7 g 0    Sig: INHALE 2 PUFFS INTO THE LUNGS EVERY 4 HOURS AS NEEDED FOR WHEEZING OR SHORTNESS OF BREATH     Pulmonology:  Beta Agonists 2 Passed - 09/14/2022  5:47 PM      Passed - Last BP in normal range    BP Readings from Last 1 Encounters:  08/20/22 122/84         Passed - Last Heart Rate in normal range    Pulse Readings from Last 1 Encounters:  08/20/22 89         Passed - Valid encounter within last 12 months    Recent Outpatient Visits           3 weeks ago Need for immunization against influenza   [place], NP   6 months ago Need for shingles vaccine   [place] [facility] Kerin Perna, NP   11 months ago Screening for diabetes mellitus   [place] [facility] Kerin Perna, NP   1 year ago Tobacco use disorder   [place] [facility] Kerin Perna, NP   2 years ago Screening for malignant neoplasm of cervix   [place] [facility] Kerin Perna, NP       Future Appointments             In 2 months Oletta Lamas, Milford Cage, NP [place]

## 2022-09-25 ENCOUNTER — Ambulatory Visit (INDEPENDENT_AMBULATORY_CARE_PROVIDER_SITE_OTHER): Payer: BC Managed Care – PPO | Admitting: Family Medicine

## 2022-09-25 ENCOUNTER — Encounter (INDEPENDENT_AMBULATORY_CARE_PROVIDER_SITE_OTHER): Payer: Self-pay | Admitting: Family Medicine

## 2022-09-25 VITALS — BP 117/84 | HR 73 | Temp 97.9°F | Ht 63.0 in | Wt 220.0 lb

## 2022-09-25 DIAGNOSIS — E782 Mixed hyperlipidemia: Secondary | ICD-10-CM | POA: Diagnosis not present

## 2022-09-25 DIAGNOSIS — F17210 Nicotine dependence, cigarettes, uncomplicated: Secondary | ICD-10-CM | POA: Diagnosis not present

## 2022-09-25 DIAGNOSIS — R0602 Shortness of breath: Secondary | ICD-10-CM

## 2022-09-25 DIAGNOSIS — R739 Hyperglycemia, unspecified: Secondary | ICD-10-CM | POA: Diagnosis not present

## 2022-09-25 DIAGNOSIS — Z1331 Encounter for screening for depression: Secondary | ICD-10-CM | POA: Diagnosis not present

## 2022-09-25 DIAGNOSIS — I10 Essential (primary) hypertension: Secondary | ICD-10-CM | POA: Diagnosis not present

## 2022-09-25 DIAGNOSIS — Z6839 Body mass index (BMI) 39.0-39.9, adult: Secondary | ICD-10-CM

## 2022-09-25 DIAGNOSIS — F17200 Nicotine dependence, unspecified, uncomplicated: Secondary | ICD-10-CM

## 2022-09-25 DIAGNOSIS — R5383 Other fatigue: Secondary | ICD-10-CM

## 2022-09-25 NOTE — Progress Notes (Deleted)
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  Lives with her brother, Carolyn Espinoza, at home.  Desired weight is 150-160lbs (last time she was this weight was in 1996).  Does acknowledge she does some emotional eating.  Weekends tend to be harder previously due to previously drinking quite a bit of beer. She skips breakfast daily due to time.  Gets up at 4:30/5am on weekdays.  Coffee in the am with cream and sugar (1tbsp cream and 1/8cup of sugar) with 2 eggs and liver pudding and 1 slice of bread (feels satisfied).  11am microwave meal of salisbury steak and mashed potatoes with water.  Hibachi wings and fried rice (6 wings and 1 cup fried rice)(feel full).  May have a sandwich or two with mayo and mustard.

## 2022-09-26 LAB — CBC WITH DIFFERENTIAL/PLATELET
Basophils Absolute: 0.1 10*3/uL (ref 0.0–0.2)
Basos: 1 %
EOS (ABSOLUTE): 0.1 10*3/uL (ref 0.0–0.4)
Eos: 1 %
Hematocrit: 50.9 % — ABNORMAL HIGH (ref 34.0–46.6)
Hemoglobin: 16.4 g/dL — ABNORMAL HIGH (ref 11.1–15.9)
Immature Grans (Abs): 0 10*3/uL (ref 0.0–0.1)
Immature Granulocytes: 0 %
Lymphocytes Absolute: 3.2 10*3/uL — ABNORMAL HIGH (ref 0.7–3.1)
Lymphs: 34 %
MCH: 29.8 pg (ref 26.6–33.0)
MCHC: 32.2 g/dL (ref 31.5–35.7)
MCV: 93 fL (ref 79–97)
Monocytes Absolute: 0.5 10*3/uL (ref 0.1–0.9)
Monocytes: 5 %
Neutrophils Absolute: 5.8 10*3/uL (ref 1.4–7.0)
Neutrophils: 59 %
Platelets: 241 10*3/uL (ref 150–450)
RBC: 5.5 x10E6/uL — ABNORMAL HIGH (ref 3.77–5.28)
RDW: 13.2 % (ref 11.7–15.4)
WBC: 9.6 10*3/uL (ref 3.4–10.8)

## 2022-09-26 LAB — COMPREHENSIVE METABOLIC PANEL
ALT: 15 IU/L (ref 0–32)
AST: 18 IU/L (ref 0–40)
Albumin/Globulin Ratio: 1.1 — ABNORMAL LOW (ref 1.2–2.2)
Albumin: 4.2 g/dL (ref 3.8–4.9)
Alkaline Phosphatase: 67 IU/L (ref 44–121)
BUN/Creatinine Ratio: 9 (ref 9–23)
BUN: 7 mg/dL (ref 6–24)
Bilirubin Total: 0.4 mg/dL (ref 0.0–1.2)
CO2: 19 mmol/L — ABNORMAL LOW (ref 20–29)
Calcium: 9.6 mg/dL (ref 8.7–10.2)
Chloride: 98 mmol/L (ref 96–106)
Creatinine, Ser: 0.8 mg/dL (ref 0.57–1.00)
Globulin, Total: 3.9 g/dL (ref 1.5–4.5)
Glucose: 72 mg/dL (ref 70–99)
Potassium: 4.7 mmol/L (ref 3.5–5.2)
Sodium: 140 mmol/L (ref 134–144)
Total Protein: 8.1 g/dL (ref 6.0–8.5)
eGFR: 85 mL/min/{1.73_m2} (ref >59)

## 2022-09-26 LAB — LIPID PANEL WITH LDL/HDL RATIO
Cholesterol, Total: 180 mg/dL (ref 100–199)
HDL: 55 mg/dL (ref >39)
LDL Chol Calc (NIH): 109 mg/dL — ABNORMAL HIGH (ref 0–99)
LDL/HDL Ratio: 2 ratio (ref 0.0–3.2)
Triglycerides: 85 mg/dL (ref 0–149)
VLDL Cholesterol Cal: 16 mg/dL (ref 5–40)

## 2022-09-26 LAB — VITAMIN B12: Vitamin B-12: 316 pg/mL (ref 232–1245)

## 2022-09-26 LAB — HEMOGLOBIN A1C
Est. average glucose Bld gHb Est-mCnc: 126 mg/dL
Hgb A1c MFr Bld: 6 % — ABNORMAL HIGH (ref 4.8–5.6)

## 2022-09-26 LAB — INSULIN, RANDOM: INSULIN: 18.6 u[IU]/mL (ref 2.6–24.9)

## 2022-09-26 LAB — TSH: TSH: 0.671 u[IU]/mL (ref 0.450–4.500)

## 2022-09-26 LAB — FOLATE: Folate: 8 ng/mL (ref >3.0)

## 2022-09-26 LAB — T3: T3, Total: 156 ng/dL (ref 71–180)

## 2022-09-26 LAB — T4, FREE: Free T4: 1.15 ng/dL (ref 0.82–1.77)

## 2022-09-26 LAB — VITAMIN D 25 HYDROXY (VIT D DEFICIENCY, FRACTURES): Vit D, 25-Hydroxy: 10.1 ng/mL — ABNORMAL LOW (ref 30.0–100.0)

## 2022-10-02 ENCOUNTER — Encounter (INDEPENDENT_AMBULATORY_CARE_PROVIDER_SITE_OTHER): Payer: Self-pay

## 2022-10-07 NOTE — Progress Notes (Signed)
Chief Complaint:   OBESITY Carolyn Espinoza (MR# [id number]) is a 57 y.o. female who presents for evaluation and treatment of obesity and related comorbidities. Current BMI is Body mass index is 38.97 kg/m. Carolyn Espinoza has been struggling with her weight for many years and has been unsuccessful in either losing weight, maintaining weight loss, or reaching her healthy weight goal.  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 Lives with her brother, Carolyn Espinoza, at home. Desired weight is 150-160lbs (last time she was this weight was in 1996). Does acknowledge she does some emotional eating. Weekends tend to be harder previously due to previously drinking quite a bit of beer. She skips breakfast daily due to time. Gets up at 4:30/5am on weekdays. Coffee in the am with cream and sugar (1tbsp cream and 1/8cup of sugar) with 2 eggs and liver pudding and 1 slice of bread (feels satisfied). 11am microwave meal of salisbury steak and mashed potatoes with water. Hibachi wings and fried rice (6 wings and 1 cup fried rice)(feel full). May have a sandwich or two with mayo and mustard.   Carolyn Espinoza is currently in the action stage of change and ready to dedicate time achieving and maintaining a healthier weight. Carolyn Espinoza is interested in becoming our patient and working on intensive lifestyle modifications including (but not limited to) diet and exercise for weight loss.  Carolyn Espinoza's habits were reviewed today and are as follows: Her family eats meals together, she thinks her family will eat healthier with her, her desired weight loss is 70 lbs, she started gaining weight after she stopped bing active, her heaviest weight ever was 220 pounds, she has significant food cravings issues, she snacks frequently in the evenings, she skips meals frequently, she is frequently drinking liquids with  calories, she frequently eats larger portions than normal, and she struggles with emotional eating.  Depression Screen Carolyn Espinoza's Food and Mood (modified PHQ-9) score was 16.  Subjective:   1. Other fatigue Carolyn Espinoza admits to daytime somnolence and admits to waking up still tired. Patient has a history of symptoms of daytime fatigue and morning fatigue. Carolyn Espinoza generally gets 7 hours of sleep per night, and states that she has nightime awakenings and generally restful sleep. Snoring is not present. Apneic episodes are not present. Epworth Sleepiness Score is 9.  EKG, NSR at 70 bpm.  2. SOBOE (shortness of breath on exertion) Carolyn Espinoza notes increasing shortness of breath with exercising and seems to be worsening over time with weight gain. She notes getting out of breath sooner with activity than she used to. This has not gotten worse recently. Kelinda denies shortness of breath at rest or orthopnea.  3. Essential hypertension Patient was diagnosed about 13 years ago.  She has been on amlodipine and lisinopril/HCTZ.  Blood pressure controlled today.  4. Mixed hyperlipidemia Patient has a historical diagnosis of hyperlipidemia.  5. Tobacco use disorder Patient smokes about half pack per day.  Labs H&H elevated at.  Patient has a history of asthma.  6. Elevated blood sugar Blood sugars have been elevated previously.  A1c last checked was within normal limits.  Assessment/Plan:   1. Other fatigue Carolyn Espinoza does feel that her weight is causing her energy to be lower than it should be. Fatigue may be related to obesity, depression or many other causes. Labs will be ordered, and in  the meanwhile, Carolyn Espinoza will focus on self care including making healthy food choices, increasing physical activity and focusing on stress reduction.  Check EKG, IC, labs today.  - EKG 12-Lead - Vitamin B12 - Folate - VITAMIN D 25 Hydroxy (Vit-D Deficiency, Fractures) - TSH - T4, free - T3  2. SOBOE (shortness of breath on  exertion) Carolyn Espinoza does feel that she gets out of breath more easily that she used to when she exercises. Carolyn Espinoza's shortness of breath appears to be obesity related and exercise induced. She has agreed to work on weight loss and gradually increase exercise to treat her exercise induced shortness of breath. Will continue to monitor closely.  3. Essential hypertension Check labs today.  - Comprehensive metabolic panel  4. Mixed hyperlipidemia Check labs today.  - Lipid Panel With LDL/HDL Ratio  5. Tobacco use disorder Check labs today.  - CBC with Differential/Platelet  6. Elevated blood sugar Check labs today.  - Hemoglobin A1c - Insulin, random  7. Depression screening Carolyn Espinoza had a positive depression screening. Depression is commonly associated with obesity and often results in emotional eating behaviors. We will monitor this closely and work on CBT to help improve the non-hunger eating patterns. Referral to Psychology may be required if no improvement is seen as she continues in our clinic.  8. Class 2 severe obesity with serious comorbidity and body mass index (BMI) of 39.0 to 39.9 in adult, unspecified obesity type Carolyn Espinoza) Carolyn Espinoza is currently in the action stage of change and her goal is to continue with weight loss efforts. I recommend Carolyn Espinoza begin the structured treatment plan as follows:  She has agreed to the Category 2 Plan.  Patient has a decreased RMR likely due to deficient nutrition and sometimes deficient calories.  Exercise goals: No exercise has been prescribed at this time.   Behavioral modification strategies: increasing lean protein intake, meal planning and cooking strategies, keeping healthy foods in the home, and planning for success.  She was informed of the importance of frequent follow-up visits to maximize her success with intensive lifestyle modifications for her multiple health conditions. She was informed we would discuss her lab results at her next visit  unless there is a critical issue that needs to be addressed sooner. Carolyn Espinoza agreed to keep her next visit at the agreed upon time to discuss these results.  Objective:   Blood pressure 117/84, pulse 73, temperature 97.9 F (36.6 C), height 5\' 3"  (1.6 m), weight 220 lb (99.8 kg), last menstrual period 02/11/2016, SpO2 95 %. Body mass index is 38.97 kg/m.  EKG: Normal sinus rhythm, rate 70 bpm.  Indirect Calorimeter completed today shows a VO2 of 184 and a REE of 1267.  Her calculated basal metabolic rate is [id number] thus her basal metabolic rate is worse than expected.  General: Cooperative, alert, well developed, in no acute distress. HEENT: Conjunctivae and lids unremarkable. Cardiovascular: Regular rhythm.  Lungs: Normal work of breathing. Neurologic: No focal deficits.   Lab Results  Component Value Date   CREATININE 0.80 09/25/2022   BUN 7 09/25/2022   NA 140 09/25/2022   K 4.7 09/25/2022   CL 98 09/25/2022   CO2 19 (L) 09/25/2022   Lab Results  Component Value Date   ALT 15 09/25/2022   AST 18 09/25/2022   ALKPHOS 67 09/25/2022   BILITOT 0.4 09/25/2022   Lab Results  Component Value Date   HGBA1C 6.0 (H) 09/25/2022   HGBA1C 5.6 10/17/2021   HGBA1C 5.2 03/31/2018  Lab Results  Component Value Date   INSULIN 18.6 09/25/2022   Lab Results  Component Value Date   TSH 0.671 09/25/2022   Lab Results  Component Value Date   CHOL 180 09/25/2022   HDL 55 09/25/2022   LDLCALC 109 (H) 09/25/2022   TRIG 85 09/25/2022   CHOLHDL 3.2 03/01/2022   Lab Results  Component Value Date   WBC 9.6 09/25/2022   HGB 16.4 (H) 09/25/2022   HCT 50.9 (H) 09/25/2022   MCV 93 09/25/2022   PLT 241 09/25/2022   No results found for: "IRON", "TIBC", "FERRITIN"  Attestation Statements:   Reviewed by clinician on day of visit: allergies, medications, problem list, medical history, surgical history, family history, social history, and previous encounter notes.   time spent on visit  including pre-visit chart review and post-visit charting and care was 40 minutes.   I, Davy Pique, RMA, am acting as transcriptionist for Coralie Common, MD.  This is the patient's first visit at [facility]. The patient's NEW PATIENT PACKET was reviewed at length. Included in the packet: current and past health history, medications, allergies, ROS, gynecologic history (women only), surgical history, family history, social history, weight history, weight loss surgery history (for those that have had weight loss surgery), nutritional evaluation, mood and food questionnaire, PHQ9, Epworth questionnaire, sleep habits questionnaire, patient life and health improvement goals questionnaire. These will all be scanned into the patient's chart under media.   During the visit, I independently reviewed the patient's EKG, bioimpedance scale results, and indirect calorimeter results. I used this information to tailor a meal plan for the patient that will help her to lose weight and will improve her obesity-related conditions going forward. I performed a medically necessary appropriate examination and/or evaluation. I discussed the assessment and treatment plan with the patient. The patient was provided an opportunity to ask questions and all were answered. The patient agreed with the plan and demonstrated an understanding of the instructions. Labs were ordered at this visit and will be reviewed at the next visit unless more critical results need to be addressed immediately. Clinical information was updated and documented in the EMR.    I have reviewed the above documentation for accuracy and completeness, and I agree with the above. - Coralie Common, MD

## 2022-10-08 ENCOUNTER — Ambulatory Visit (INDEPENDENT_AMBULATORY_CARE_PROVIDER_SITE_OTHER): Payer: Self-pay | Admitting: Family Medicine

## 2022-10-09 ENCOUNTER — Ambulatory Visit (INDEPENDENT_AMBULATORY_CARE_PROVIDER_SITE_OTHER): Payer: BC Managed Care – PPO | Admitting: Family Medicine

## 2022-10-09 ENCOUNTER — Encounter (INDEPENDENT_AMBULATORY_CARE_PROVIDER_SITE_OTHER): Payer: Self-pay | Admitting: Family Medicine

## 2022-10-09 VITALS — BP 122/87 | HR 6 | Temp 97.9°F | Ht 63.0 in | Wt 216.0 lb

## 2022-10-09 DIAGNOSIS — Z6838 Body mass index (BMI) 38.0-38.9, adult: Secondary | ICD-10-CM

## 2022-10-09 DIAGNOSIS — R7303 Prediabetes: Secondary | ICD-10-CM

## 2022-10-09 DIAGNOSIS — E669 Obesity, unspecified: Secondary | ICD-10-CM

## 2022-10-09 DIAGNOSIS — I10 Essential (primary) hypertension: Secondary | ICD-10-CM | POA: Diagnosis not present

## 2022-10-09 DIAGNOSIS — E7849 Other hyperlipidemia: Secondary | ICD-10-CM | POA: Diagnosis not present

## 2022-10-09 DIAGNOSIS — D751 Secondary polycythemia: Secondary | ICD-10-CM

## 2022-10-09 DIAGNOSIS — E559 Vitamin D deficiency, unspecified: Secondary | ICD-10-CM | POA: Diagnosis not present

## 2022-10-09 MED ORDER — VITAMIN D (ERGOCALCIFEROL) 1.25 MG (50000 UNIT) PO CAPS: 50000.0000 [IU] | ORAL_CAPSULE | ORAL | 0 refills | Status: DC

## 2022-10-09 NOTE — Progress Notes (Signed)
Chief Complaint:   OBESITY Carolyn Espinoza is here to discuss her progress with her obesity treatment plan along with follow-up of her obesity related diagnoses. Carolyn Espinoza is on the Category 2 Plan and states she is following her eating plan approximately 95% of the time. Carolyn Espinoza states she is exercising for 20 minutes 3-4 times per week.  Today's visit was #: 2 Starting weight: 220 lbs Starting date: 09/25/2022 Today's weight: 216 LBS Today's date: 10/09/2022 Total lbs lost to date: 4 LBS Total lbs lost since last in-office visit: 4 LBS  Interim History: Patient felt the first two weeks were tough but she feels like she could get used to it.  She is opting for a salad at lunch and is toasting the bread.  She is choosing rice occasionally for her snack calories.  She drank 1 can of diet soda since starting.  She is drinking more water throughout the day.  She is doing a probiotic in the am and pm and mixing it with water. May have felt one instance of hunger last week.  She is doing 4oz of meat at supper and 4oz at lunch.  Patient does not  feel like any changes have to be made to meal plan over the next few weeks.  She isn't a huge bread fan so she does often swap with other carbs for equivalent calories. No upcoming plans for events or travel.  Subjective:   1. Essential hypertension Patient's blood pressure is controlled today.  Patient denies chest pain, chest pressure, headache.  2. Other hyperlipidemia Patient LDL slightly elevated to 109 from 91.  Triglycerides 85, HDL 55.  3. Vitamin D deficiency Vitamin D level at 10.1, patient is positive for fatigue and not on any supplements.  4. Prediabetes Patient last A1c was 6.0, insulin level 18.6.  Patient has some increased desire for carbs like rice, oranges.  Patient is not on any medication.  5. Erythrocytosis Patient has elevated RBC and H&H.  Patient is still smoking.  CBC slightly increased, H&H and RBC values from 1 year  ago.  Assessment/Plan:   1. Essential hypertension Continue Zestoretic at current dose no change in treatment.  2. Other hyperlipidemia Continue category 2 meal plan.  Repeat labs in 3 months to ensure LDL is trending downwards.  3. Vitamin D deficiency Refill- Vitamin D, Ergocalciferol, (DRISDOL) 1.25 MG (50000 UNIT) CAPS capsule; Take 1 capsule (50,000 Units total) by mouth every 7 (seven) days.  Dispense: 4 capsule; Refill: 0  4. Prediabetes Pathophysiology of insulin resistance, prediabetes, diabetes discussed with patient today.  No medications at this time.  Repeat labs in 1 to 3 months.  5. Erythrocytosis Repeat labs in 3 months.  Counseled patient on the importance of cutting back on cigarettes as well as passive smoke exposure.  6. BMI 38.0-38.9,adult  7. Obesity with starting BMI of 39.0 Carolyn Espinoza is currently in the action stage of change. As such, her goal is to continue with weight loss efforts. She has agreed to the Category 2 Plan.   Exercise goals: All adults should avoid inactivity. Some physical activity is better than none, and adults who participate in any amount of physical activity gain some health benefits.  Behavioral modification strategies: increasing lean protein intake, meal planning and cooking strategies, keeping healthy foods in the home, and planning for success.  Carolyn Espinoza has agreed to follow-up with our clinic in 2 weeks. She was informed of the importance of frequent follow-up visits to maximize her success  with intensive lifestyle modifications for her multiple health conditions.   Objective:   Blood pressure 122/87, pulse (!) 6, temperature 97.9 F (36.6 C), height 5\' 3"  (1.6 m), weight 216 lb (98 kg), last menstrual period 02/11/2016, SpO2 96 %. Body mass index is 38.26 kg/m.  General: Cooperative, alert, well developed, in no acute distress. HEENT: Conjunctivae and lids unremarkable. Cardiovascular: Regular rhythm.  Lungs: Normal work of  breathing. Neurologic: No focal deficits.   Lab Results  Component Value Date   CREATININE 0.80 09/25/2022   BUN 7 09/25/2022   NA 140 09/25/2022   K 4.7 09/25/2022   CL 98 09/25/2022   CO2 19 (L) 09/25/2022   Lab Results  Component Value Date   ALT 15 09/25/2022   AST 18 09/25/2022   ALKPHOS 67 09/25/2022   BILITOT 0.4 09/25/2022   Lab Results  Component Value Date   HGBA1C 6.0 (H) 09/25/2022   HGBA1C 5.6 10/17/2021   HGBA1C 5.2 03/31/2018   Lab Results  Component Value Date   INSULIN 18.6 09/25/2022   Lab Results  Component Value Date   TSH 0.671 09/25/2022   Lab Results  Component Value Date   CHOL 180 09/25/2022   HDL 55 09/25/2022   LDLCALC 109 (H) 09/25/2022   TRIG 85 09/25/2022   CHOLHDL 3.2 03/01/2022   Lab Results  Component Value Date   VD25OH 10.1 (L) 09/25/2022   Lab Results  Component Value Date   WBC 9.6 09/25/2022   HGB 16.4 (H) 09/25/2022   HCT 50.9 (H) 09/25/2022   MCV 93 09/25/2022   PLT 241 09/25/2022   No results found for: "IRON", "TIBC", "FERRITIN"  Attestation Statements:   Reviewed by clinician on day of visit: allergies, medications, problem list, medical history, surgical history, family history, social history, and previous encounter notes.  I, Davy Pique, RMA, am acting as transcriptionist for Coralie Common, MD.  I have reviewed the above documentation for accuracy and completeness, and I agree with the above. - Coralie Common, MD

## 2022-10-11 ENCOUNTER — Other Ambulatory Visit (INDEPENDENT_AMBULATORY_CARE_PROVIDER_SITE_OTHER): Payer: Self-pay | Admitting: Primary Care

## 2022-10-11 DIAGNOSIS — I10 Essential (primary) hypertension: Secondary | ICD-10-CM

## 2022-10-11 NOTE — Telephone Encounter (Signed)
Will forward to provider  

## 2022-10-12 ENCOUNTER — Other Ambulatory Visit (INDEPENDENT_AMBULATORY_CARE_PROVIDER_SITE_OTHER): Payer: Self-pay | Admitting: Primary Care

## 2022-10-12 DIAGNOSIS — I10 Essential (primary) hypertension: Secondary | ICD-10-CM

## 2022-10-12 MED ORDER — LISINOPRIL-HYDROCHLOROTHIAZIDE 20-12.5 MG PO TABS: 2.0000 | ORAL_TABLET | Freq: Every day | ORAL | 0 refills | Status: DC

## 2022-10-21 ENCOUNTER — Other Ambulatory Visit (INDEPENDENT_AMBULATORY_CARE_PROVIDER_SITE_OTHER): Payer: Self-pay | Admitting: Primary Care

## 2022-10-21 ENCOUNTER — Ambulatory Visit
Admission: RE | Admit: 2022-10-21 | Discharge: 2022-10-21 | Disposition: A | Discharge status: Home / Self Care | Payer: BC Managed Care – PPO | Source: Ambulatory Visit | Attending: Primary Care | Admitting: Primary Care

## 2022-10-21 DIAGNOSIS — Z1211 Encounter for screening for malignant neoplasm of colon: Secondary | ICD-10-CM

## 2022-10-21 DIAGNOSIS — Z1231 Encounter for screening mammogram for malignant neoplasm of breast: Secondary | ICD-10-CM

## 2022-10-21 DIAGNOSIS — Z23 Encounter for immunization: Secondary | ICD-10-CM

## 2022-10-21 DIAGNOSIS — Z789 Other specified health status: Secondary | ICD-10-CM

## 2022-10-22 ENCOUNTER — Ambulatory Visit (INDEPENDENT_AMBULATORY_CARE_PROVIDER_SITE_OTHER): Payer: Self-pay | Admitting: Family Medicine

## 2022-10-24 ENCOUNTER — Encounter (INDEPENDENT_AMBULATORY_CARE_PROVIDER_SITE_OTHER): Payer: Self-pay | Admitting: Family Medicine

## 2022-10-24 ENCOUNTER — Ambulatory Visit (INDEPENDENT_AMBULATORY_CARE_PROVIDER_SITE_OTHER): Payer: BC Managed Care – PPO | Admitting: Family Medicine

## 2022-10-24 VITALS — BP 137/94 | HR 69 | Temp 97.9°F | Ht 63.0 in | Wt 220.0 lb

## 2022-10-24 DIAGNOSIS — E669 Obesity, unspecified: Secondary | ICD-10-CM

## 2022-10-24 DIAGNOSIS — E559 Vitamin D deficiency, unspecified: Secondary | ICD-10-CM

## 2022-10-24 DIAGNOSIS — I10 Essential (primary) hypertension: Secondary | ICD-10-CM | POA: Insufficient documentation

## 2022-10-24 DIAGNOSIS — Z6839 Body mass index (BMI) 39.0-39.9, adult: Secondary | ICD-10-CM

## 2022-10-24 DIAGNOSIS — Z6838 Body mass index (BMI) 38.0-38.9, adult: Secondary | ICD-10-CM | POA: Insufficient documentation

## 2022-10-24 DIAGNOSIS — R7303 Prediabetes: Secondary | ICD-10-CM | POA: Insufficient documentation

## 2022-10-24 MED ORDER — VITAMIN D (ERGOCALCIFEROL) 1.25 MG (50000 UNIT) PO CAPS: 50000.0000 [IU] | ORAL_CAPSULE | ORAL | 0 refills | Status: DC

## 2022-10-24 NOTE — Progress Notes (Signed)
[name], D.O.  ABFM, ABOM Specializing in Clinical Bariatric Medicine  Office located at: [address] [place], [place]  [address]     Assessment and Plan:   Medications Discontinued During This Encounter  Medication Reason   Vitamin D, Ergocalciferol, (DRISDOL) 1.25 MG (50000 UNIT) CAPS capsule Reorder     Meds ordered this encounter  Medications   Vitamin D, Ergocalciferol, (DRISDOL) 1.25 MG (50000 UNIT) CAPS capsule    Sig: Take 1 capsule (50,000 Units total) by mouth every 7 (seven) days.    Dispense:  4 capsule    Refill:  0       Vitamin D deficiency Assessment: Condition is Not at goal.. Labs were reviewed.  Lab Results  Component Value Date   VD25OH 10.1 (L) 09/25/2022    Plan: - continue Ergocalciferol 50K IU weekly. Will refill this today.  - I discussed the importance of vitamin D to the patient's health and well-being.  - I reviewed possible symptoms of low Vitamin D:  low energy, depressed mood, muscle aches, joint aches, osteoporosis etc. was reviewed with patient - low Vitamin D levels may be linked to an increased risk of cardiovascular events and even increased risk of cancers- such as colon and breast.  - ideal vitamin D levels reviewed with patient   - Informed patient this may be a lifelong thing, and she was encouraged to continue to take the medicine until told otherwise.    - weight loss will likely improve availability of vitamin D, thus encouraged Kioni to continue with meal plan and their weight loss efforts to further improve this condition.  Thus, we will need to monitor levels regularly (every 3-4 mo on average) to keep levels within normal limits and prevent over supplementation. - pt's questions and concerns regarding this condition addressed.     Prediabetes  Assessment: Condition is Not at goal.. Labs were reviewed.  Lab Results  Component Value Date   HGBA1C 6.0 (H) 09/25/2022   HGBA1C 5.6 10/17/2021   HGBA1C 5.2  03/31/2018   INSULIN 18.6 09/25/2022     Plan: Continue category 2 meal plan with lunch options. - I counseled patient on pathophysiology of the disease process of  Pre-DM.  - Stressed importance of dietary and lifestyle modifications to result in weight loss as first line txmnt - In addition, we discussed the risks and benefits of various medication options which can help [place] in the management of this disease process as well as with weight loss.  Will consider starting one of these meds in future as we will focus on prudent nutritional plan at this time.  - Continue to decrease simple carbs/ sugars; increase fiber and proteins -> follow her meal plan.   - Explained role of simple carbs and insulin levels on hunger and cravings - Handouts provided at pt's request after education provided.  All concerns/questions addressed.   - Anticipatory guidance given.   Apolonia Ellwood will continue to work on weight loss, exercise, via their meal plan we devised to help decrease the risk of progressing to diabetes.  - We will recheck A1c and fasting insulin level in approximately 3 months from last check, or as deemed appropriate.    Essential Hypertension  Assessment: Condition is Not at goal.. Labs were reviewed.  Last 3 blood pressure readings in our office are as follows: BP Readings from Last 3 Encounters:  10/24/22 (!) 137/94  10/09/22 122/87  09/25/22 117/84   The ASCVD  Risk score (Arnett DK, et al., 2019) failed to calculate for the following reasons:   The patient has a prior MI or stroke diagnosis  Lab Results  Component Value Date   CREATININE 0.80 09/25/2022     Plan: Continue Norvasc 10 mg daily in the evening. Continue Zestoretic 20-12.5 mg 2 tablets once daily in the morning. BP is not at goal today.  Counseled Sherron Ales on pathophysiology of disease and discussed treatment plan, which always includes dietary and lifestyle modification as first line.  Lifestyle changes such as  following our low salt, heart healthy meal plan and engaging in a regular exercise program discussed  - Avoid buying foods that are: processed, frozen, or prepackaged to avoid excess salt. - Ambulatory blood pressure monitoring encouraged.  Reminded patient that if they ever feel poorly in any way, to check their blood pressure and pulse as well. - We will continue to monitor closely alongside PCP/ specialists.  Pt reminded to also f/up with those individuals as instructed by them.  - We will continue to monitor symptoms as they relate to the her weight loss journey.    BMI 38.0-38.9,adult-current bmi 39.0 Obesity with starting BMI of 39.0/date 09/25/22 Assessment: Condition is Not at goal.. Biometric data collected today, was reviewed with patient.  Fat mass has increased by 6.4lb. Muscle mass has decreased by 2.6lb. Total body water has increased by 2.6lb.   Plan: Continue category 2 meal plan with lunch options. Discussed with patient she can have a 100 calorie of complex carb instead of rice.     TREATMENT PLAN FOR OBESITY:  Recommended Dietary Goals Kenetha is currently in the action stage of change. As such, her goal is to continue weight management plan. She has agreed to continue the Category 2 Plan with lunch options.  Behavioral Intervention Additional resources provided today: category 2 meal plan information and lunch options and spice packet. Discussed with patient she can have a 100 calorie of complex carb instead of rice.  Evidence-based interventions for health behavior change were utilized today including the discussion of self monitoring techniques, problem-solving barriers and SMART goal setting techniques.   Regarding patient's less desirable eating habits and patterns, we employed the technique of small changes.  Pt will specifically work on: adhering to category 2 meal plan with lunch options for next visit.    Recommended Physical Activity Goals Caleen has been advised  to work up to 150 minutes of moderate intensity aerobic activity a week and strengthening exercises 2-3 times per week for cardiovascular health, weight loss maintenance and preservation of muscle mass.  She has agreed to Continue current level of physical activity  and Think about ways to increase physical activity   FOLLOW UP: Return in about 2 weeks (around 11/07/2022). She was informed of the importance of frequent follow up visits to maximize her success with intensive lifestyle modifications for her multiple health conditions.  Subjective:   Chief complaint: Obesity Anona is here to discuss her progress with her obesity treatment plan. She is on the the Category 2 Plan and states she is following her eating plan approximately 0% of the time. She states she is exercising 5 mins of treadmill, 2 mins of bike, and 3 mins of pull bar 2 days per week.  Interval History:  SOO STEELMAN is here for a follow up office visit. We reviewed her meal plan and all questions were answered. Patient's food recall appears to be accurate and consistent with what  is on plan when she is following it. When eating on plan, her hunger and cravings are well controlled.     Since last office visit she reports not feeling well. She states her meal plan in boring and that she's not been following it as she is unused to eating lots of bread. She endorses having more rice and simple carbs, and not eating adequate proteins.  Not measuring or weighing foods  Pharmacotherapy for weight loss: She is not currently taking medications  for medical weight loss.  Review of Systems:  Pertinent positives were addressed with patient today.  Weight Summary and Biometrics   Weight Lost Since Last Visit: 0  Weight Gained Since Last Visit: 4lb    Vitals Temp: 97.9 F (36.6 C) BP: (!) 137/94 Pulse Rate: 69 SpO2: 96 %   Anthropometric Measurements Height: 5\' 3"  (1.6 m) Weight: 220 lb (99.8 kg) BMI (Calculated):  38.98 Weight at Last Visit: 216lb Weight Lost Since Last Visit: 0 Weight Gained Since Last Visit: 4lb Starting Weight: 220lb Total Weight Loss (lbs): 0 lb (0 kg) Peak Weight: 220lb   Body Composition  Body Fat %: 44.4 % Fat Mass (lbs): 97.6 lbs Muscle Mass (lbs): 116.2 lbs Total Body Water (lbs): 75.2 lbs Visceral Fat Rating : 14   Other Clinical Data Fasting: no Labs: no Today's Visit #: 3 Starting Date: 09/25/22    Objective:   PHYSICAL EXAM:  Blood pressure (!) 137/94, pulse 69, temperature 97.9 F (36.6 C), height 5\' 3"  (1.6 m), weight 220 lb (99.8 kg), last menstrual period 02/11/2016, SpO2 96 %. Body mass index is 38.97 kg/m.  General: Well Developed, well nourished, and in no acute distress.  HEENT: Normocephalic, atraumatic Skin: Warm and dry, cap RF less 2 sec, good turgor Chest:  Normal excursion, shape, no gross abn Respiratory: speaking in full sentences, no conversational dyspnea NeuroM-Sk: Ambulates w/o assistance, moves * 4 Psych: A and O *3, insight good, mood-full  DIAGNOSTIC DATA REVIEWED:  BMET    Component Value Date/Time   NA 140 09/25/2022 1031   K 4.7 09/25/2022 1031   CL 98 09/25/2022 1031   CO2 19 (L) 09/25/2022 1031   GLUCOSE 72 09/25/2022 1031   GLUCOSE 116 (H) 04/28/2020 1358   BUN 7 09/25/2022 1031   CREATININE 0.80 09/25/2022 1031   CALCIUM 9.6 09/25/2022 1031   GFRNONAA >60 04/28/2020 1358   GFRAA 90 05/10/2019 0855   Lab Results  Component Value Date   HGBA1C 6.0 (H) 09/25/2022   HGBA1C 5.2 03/31/2018   Lab Results  Component Value Date   INSULIN 18.6 09/25/2022   Lab Results  Component Value Date   TSH 0.671 09/25/2022   CBC    Component Value Date/Time   WBC 9.6 09/25/2022 1031   WBC 8.6 04/28/2020 1358   RBC 5.50 (H) 09/25/2022 1031   RBC 4.79 04/28/2020 1358   HGB 16.4 (H) 09/25/2022 1031   HCT 50.9 (H) 09/25/2022 1031   PLT 241 09/25/2022 1031   MCV 93 09/25/2022 1031   MCH 29.8 09/25/2022 1031    MCH 31.7 04/28/2020 1358   MCHC 32.2 09/25/2022 1031   MCHC 33.8 04/28/2020 1358   RDW 13.2 09/25/2022 1031   Iron Studies No results found for: "IRON", "TIBC", "FERRITIN", "IRONPCTSAT" Lipid Panel     Component Value Date/Time   CHOL 180 09/25/2022 1031   TRIG 85 09/25/2022 1031   HDL 55 09/25/2022 1031   CHOLHDL 3.2 03/01/2022 1113   CHOLHDL  3.3 Ratio 02/24/2009 2137   VLDL 29 02/24/2009 2137   LDLCALC 109 (H) 09/25/2022 1031   Hepatic Function Panel     Component Value Date/Time   PROT 8.1 09/25/2022 1031   ALBUMIN 4.2 09/25/2022 1031   AST 18 09/25/2022 1031   ALT 15 09/25/2022 1031   ALKPHOS 67 09/25/2022 1031   BILITOT 0.4 09/25/2022 1031      Component Value Date/Time   TSH 0.671 09/25/2022 1031   Nutritional Lab Results  Component Value Date   VD25OH 10.1 (L) 09/25/2022    Attestations:   Reviewed by clinician on day of visit: allergies, medications, problem list, medical history, surgical history, family history, social history, and previous encounter notes.   I,Safa M Kadhim,acting as a scribe for [employer], DO.,have documented all relevant documentation on the behalf of Thomasene Lot, DO,as directed by  Thomasene Lot, DO while in the presence of Thomasene Lot, DO.   I, Thomasene Lot, DO, have reviewed all documentation for this visit. The documentation on 10/24/22 for the exam, diagnosis, procedures, and orders are all accurate and complete.

## 2022-11-07 ENCOUNTER — Ambulatory Visit (INDEPENDENT_AMBULATORY_CARE_PROVIDER_SITE_OTHER): Payer: BC Managed Care – PPO | Admitting: Family Medicine

## 2022-11-07 ENCOUNTER — Encounter (INDEPENDENT_AMBULATORY_CARE_PROVIDER_SITE_OTHER): Payer: Self-pay | Admitting: Family Medicine

## 2022-11-07 VITALS — BP 131/85 | HR 67 | Temp 98.7°F | Ht 63.0 in | Wt 219.8 lb

## 2022-11-07 DIAGNOSIS — F4321 Adjustment disorder with depressed mood: Secondary | ICD-10-CM

## 2022-11-07 DIAGNOSIS — I10 Essential (primary) hypertension: Secondary | ICD-10-CM | POA: Diagnosis not present

## 2022-11-07 DIAGNOSIS — Z6838 Body mass index (BMI) 38.0-38.9, adult: Secondary | ICD-10-CM

## 2022-11-07 DIAGNOSIS — F17210 Nicotine dependence, cigarettes, uncomplicated: Secondary | ICD-10-CM | POA: Diagnosis not present

## 2022-11-07 DIAGNOSIS — E559 Vitamin D deficiency, unspecified: Secondary | ICD-10-CM

## 2022-11-07 DIAGNOSIS — E669 Obesity, unspecified: Secondary | ICD-10-CM

## 2022-11-07 DIAGNOSIS — R7303 Prediabetes: Secondary | ICD-10-CM | POA: Diagnosis not present

## 2022-11-07 DIAGNOSIS — Z716 Tobacco abuse counseling: Secondary | ICD-10-CM | POA: Diagnosis not present

## 2022-11-07 DIAGNOSIS — F17200 Nicotine dependence, unspecified, uncomplicated: Secondary | ICD-10-CM

## 2022-11-07 NOTE — Progress Notes (Signed)
[name], D.O.  ABFM, ABOM Specializing in Clinical Bariatric Medicine  Office located at: [address] [place], [place]  [address]     Assessment and Plan:   No orders of the defined types were placed in this encounter.   There are no discontinued medications.   No orders of the defined types were placed in this encounter.   Vitamin D deficiency Assessment: Condition is Not at goal.. Labs were reviewed..  Lab Results  Component Value Date   VD25OH 10.1 (L) 09/25/2022    Plan: continue Ergocalciferol 50K IU weekly. Will refill this today.  - I discussed the importance of vitamin D to the patient's health and well-being as well as to their ability to lose weight.  - I reviewed possible symptoms of low Vitamin D:  low energy, depressed mood, muscle aches, joint aches, osteoporosis etc. with patient - It has been show that administration of vitamin D supplementation leads to improved satiety and a decrease in inflammatory markers.  Hence, low Vitamin D levels may be linked to an increased risk of cardiovascular events and even increased risk of cancers- such as colon and breast. - ideal vitamin D levels reviewed with patient   - Informed patient this may be a lifelong thing, and she was encouraged to continue to take the medicine until told otherwise.    - weight loss will likely improve availability of vitamin D, thus encouraged Carolyn Espinoza to continue with meal plan and their weight loss efforts to further improve this condition.  Thus, we will need to monitor levels regularly (every 3-4 mo on average) to keep levels within normal limits and prevent over supplementation. - pt's questions and concerns regarding this condition addressed.    Prediabetes Assessment: Condition is Not at goal.. Labs were reviewed..  Lab Results  Component Value Date   HGBA1C 6.0 (H) 09/25/2022   HGBA1C 5.6 10/17/2021   HGBA1C 5.2 03/31/2018   INSULIN 18.6 09/25/2022     Plan: continue  category 2 meal plan information and lunch options  - I counseled patient on pathophysiology of the disease process of I.R. and Pre-DM. - Stressed importance of dietary and lifestyle modifications to result in weight loss as first line txmnt - In addition, we discussed the risks and benefits of various medication options which can help [place] in the management of this disease process as well as with weight loss.  Will consider starting one of these meds in future as we will focus on prudent nutritional plan at this time.  - Continue to decrease simple carbs/ sugars; increase fiber and proteins -> follow her meal plan.   - Explained role of simple carbs and insulin levels on hunger and cravings - Handouts provided at pt's request after education provided.  All concerns/questions addressed.   - Anticipatory guidance given.   Carolyn Espinoza will continue to work on weight loss, exercise, via their meal plan we devised to help decrease the risk of progressing to diabetes.  - We will recheck A1c and fasting insulin level in approximately 3 months from last check, or as deemed appropriate.     Essential hypertension Assessment: Condition is Improving, but not optimized.. Labs were reviewed.  Last 3 blood pressure readings in our office are as follows: BP Readings from Last 3 Encounters:  11/07/22 131/85  10/24/22 (!) 137/94  10/09/22 122/87   The ASCVD Risk score (Arnett DK, et al., 2019) failed to calculate for the following reasons:   The  patient has a prior MI or stroke diagnosis  Lab Results  Component Value Date   CREATININE 0.80 09/25/2022     Plan:Continue Norvasc 10 mg daily in the evening. Continue Zestoretic 20-12.5 mg 2 tablets once daily in the morning.  BP is not at goal today.  Counseled Carolyn Espinoza on pathophysiology of disease and discussed treatment plan, which always includes dietary and lifestyle modification as first line.  Lifestyle changes such as following our low salt, heart  healthy meal plan and engaging in a regular exercise program discussed  - Avoid buying foods that are: processed, frozen, or prepackaged to avoid excess salt. - Ambulatory blood pressure monitoring encouraged.  Reminded patient that if they ever feel poorly in any way, to check their blood pressure and pulse as well. - We will continue to monitor closely alongside PCP/ specialists.  Pt reminded to also f/up with those individuals as instructed by them.  - We will continue to monitor symptoms as they relate to the her weight loss journey.   Tobacco use disorder Tobacco abuse counseling Assessment: Condition is Improving, but not optimized. She states that she's been smoking 1.5 packs for 30 years. She's been smoking half a pack daily.   Plan:continue to reduce smoking.continue taking Nicotrol per PCP recommendation. We discussed starting Wellbutrin 100 mg BID. Will start this.    Adjustment disorder with depressed mood- care giver stress + emotional eating Assessment: Condition is Not at goal. 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   Plan: will start Wellbutrin 100 MG BID     TREATMENT PLAN FOR OBESITY: BMI 38.0-38.9,adult-current bmi 39.0 Obesity with starting BMI of 39.0/date 09/25/22 Assessment: Condition is docourse: improving. Biometric data collected today, was reviewed with patient.  Fat mass has decreased by 0.4lb. Muscle mass has decreased by 0.2lb. Total body water has increased by 2.4lb.   Plan:  Carolyn Espinoza is currently in the action stage of change. As such, her goal is to continue weight management plan. Carolyn Espinoza will work on healthier eating habits and try their best to follow the Continue category 2 meal plan information and lunch options  best they can.   Behavioral Intervention Additional resources provided today: category 2 meal plan information, breakfast options, and lunch  options Evidence-based interventions for health behavior change were utilized today including the discussion of self monitoring techniques, problem-solving barriers and SMART goal setting techniques.   Regarding patient's less desirable eating habits and patterns, we employed the technique of small changes.  Pt will specifically work on: every 1-2 days decrease smoking intake by 1 cigarettes will have May 16 th set as quitting day for next visit.    Recommended Physical Activity Goals Carolyn Espinoza has been advised to work up to 150 minutes of moderate intensity aerobic activity a week and strengthening exercises 2-3 times per week for cardiovascular health, weight loss maintenance and preservation of muscle mass.  She has agreed to Think about ways to increase physical activity  Pharmacotherapy We discussed various medication options to help Carolyn Espinoza with her weight loss efforts and we both agreed to start Wellbutrin.   FOLLOW UP: Return in about 3 weeks (around 11/28/2022). She was informed of the importance of frequent follow up visits to maximize her success with intensive lifestyle modifications for her multiple health conditions.   Subjective:   Chief complaint: Obesity Carolyn Espinoza is here to discuss  her progress with her obesity treatment plan. She is on the the Category 2 Plan with lunch options and states she is following her eating plan approximately 95% of the time. She states she is exercising 10 minutes 2 days per week.  Interval History:  Carolyn Espinoza is here for a follow up office visit. Since last office visit she states she would skip following the meal plan and then would go back on it. She reports smoking less than half a pack daily. She states that she's been smoking 1.5 packs for 30 years. 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    We reviewed her meal plan and all questions were answered. Patient's food recall appears to be  accurate and consistent with what is on plan when she is following it. When eating on plan, her hunger and cravings are well controlled.      Pharmacotherapy for weight loss: She is not currently taking  meds  for medical weight loss.  Denies side effects.    Review of Systems:  Pertinent positives were addressed with patient today.   Weight Summary and Biometrics   Weight Lost Since Last Visit: 1lb  Weight Gained Since Last Visit: 0    Vitals Temp: 98.7 F (37.1 C) BP: 131/85 Pulse Rate: 67 SpO2: 98 %   Anthropometric Measurements Height: 5\' 3"  (1.6 m) Weight: 219 lb 12.8 oz (99.7 kg) BMI (Calculated): 38.95 Weight at Last Visit: 220lb Weight Lost Since Last Visit: 1lb Weight Gained Since Last Visit: 0 Starting Weight: 220lb Total Weight Loss (lbs): 1 lb (0.454 kg) Peak Weight: 220lb   Body Composition  Body Fat %: 44.2 % Fat Mass (lbs): 97.2 lbs Muscle Mass (lbs): 116.4 lbs Total Body Water (lbs): 77.6 lbs Visceral Fat Rating : 14   Other Clinical Data Fasting: no Labs: no Today's Visit #: 4 Starting Date: 09/25/22     Objective:   PHYSICAL EXAM: Blood pressure 131/85, pulse 67, temperature 98.7 F (37.1 C), height 5\' 3"  (1.6 m), weight 219 lb 12.8 oz (99.7 kg), last menstrual period 02/11/2016, SpO2 98 %. Body mass index is 38.94 kg/m.  General: Well Developed, well nourished, and in no acute distress.  HEENT: Normocephalic, atraumatic Skin: Warm and dry, cap RF less 2 sec, good turgor Chest:  Normal excursion, shape, no gross abn Respiratory: speaking in full sentences, no conversational dyspnea NeuroM-Sk: Ambulates w/o assistance, moves * 4 Psych: A and O *3, insight good, mood-full  DIAGNOSTIC DATA REVIEWED:  BMET    Component Value Date/Time   NA 140 09/25/2022 1031   K 4.7 09/25/2022 1031   CL 98 09/25/2022 1031   CO2 19 (L) 09/25/2022 1031   GLUCOSE 72 09/25/2022 1031   GLUCOSE 116 (H) 04/28/2020 1358   BUN 7 09/25/2022 1031    CREATININE 0.80 09/25/2022 1031   CALCIUM 9.6 09/25/2022 1031   GFRNONAA >60 04/28/2020 1358   GFRAA 90 05/10/2019 0855   Lab Results  Component Value Date   HGBA1C 6.0 (H) 09/25/2022   HGBA1C 5.2 03/31/2018   Lab Results  Component Value Date   INSULIN 18.6 09/25/2022   Lab Results  Component Value Date   TSH 0.671 09/25/2022   CBC    Component Value Date/Time   WBC 9.6 09/25/2022 1031   WBC 8.6 04/28/2020 1358   RBC 5.50 (H) 09/25/2022 1031   RBC 4.79 04/28/2020 1358   HGB 16.4 (H)  09/25/2022 1031   HCT 50.9 (H) 09/25/2022 1031   PLT 241 09/25/2022 1031   MCV 93 09/25/2022 1031   MCH 29.8 09/25/2022 1031   MCH 31.7 04/28/2020 1358   MCHC 32.2 09/25/2022 1031   MCHC 33.8 04/28/2020 1358   RDW 13.2 09/25/2022 1031   Iron Studies No results found for: "IRON", "TIBC", "FERRITIN", "IRONPCTSAT" Lipid Panel     Component Value Date/Time   CHOL 180 09/25/2022 1031   TRIG 85 09/25/2022 1031   HDL 55 09/25/2022 1031   CHOLHDL 3.2 03/01/2022 1113   CHOLHDL 3.3 Ratio 02/24/2009 2137   VLDL 29 02/24/2009 2137   LDLCALC 109 (H) 09/25/2022 1031   Hepatic Function Panel     Component Value Date/Time   PROT 8.1 09/25/2022 1031   ALBUMIN 4.2 09/25/2022 1031   AST 18 09/25/2022 1031   ALT 15 09/25/2022 1031   ALKPHOS 67 09/25/2022 1031   BILITOT 0.4 09/25/2022 1031      Component Value Date/Time   TSH 0.671 09/25/2022 1031   Nutritional Lab Results  Component Value Date   VD25OH 10.1 (L) 09/25/2022    Attestations:   Reviewed by clinician on day of visit: allergies, medications, problem list, medical history, surgical history, family history, social history, and previous encounter notes.   Patient was in the office today and time spent on visit including pre-visit chart review and post-visit care/coordination of care and electronic medical record documentation was 30 minutes. 50% of the time was in face to face counseling of this patient's medical condition(s)  and providing education on treatment options to include the first-line treatment of diet and lifestyle modification.   I,Safa M Kadhim,acting as a scribe for [employer], DO.,have documented all relevant documentation on the behalf of Thomasene Lot, DO,as directed by  Thomasene Lot, DO while in the presence of Thomasene Lot, DO.   I, Thomasene Lot, DO, have reviewed all documentation for this visit. The documentation on 11/07/22 for the exam, diagnosis, procedures, and orders are all accurate and complete.

## 2022-11-18 ENCOUNTER — Ambulatory Visit (INDEPENDENT_AMBULATORY_CARE_PROVIDER_SITE_OTHER): Payer: BC Managed Care – PPO | Admitting: Primary Care

## 2022-11-19 MED ORDER — VITAMIN D (ERGOCALCIFEROL) 1.25 MG (50000 UNIT) PO CAPS: 50000.0000 [IU] | ORAL_CAPSULE | ORAL | 0 refills | Status: DC

## 2022-11-19 MED ORDER — BUPROPION HCL ER (SR) 100 MG PO TB12
100.0000 mg | ORAL_TABLET | Freq: Two times a day (BID) | ORAL | 0 refills | Status: DC
Start: 2022-11-19 — End: 2022-12-26

## 2022-11-26 ENCOUNTER — Encounter (INDEPENDENT_AMBULATORY_CARE_PROVIDER_SITE_OTHER): Payer: Self-pay | Admitting: Family Medicine

## 2022-11-26 ENCOUNTER — Ambulatory Visit (INDEPENDENT_AMBULATORY_CARE_PROVIDER_SITE_OTHER): Payer: BC Managed Care – PPO | Admitting: Family Medicine

## 2022-11-26 VITALS — BP 127/83 | HR 82 | Temp 97.9°F | Ht 63.0 in | Wt 219.0 lb

## 2022-11-26 DIAGNOSIS — F4321 Adjustment disorder with depressed mood: Secondary | ICD-10-CM

## 2022-11-26 DIAGNOSIS — E559 Vitamin D deficiency, unspecified: Secondary | ICD-10-CM

## 2022-11-26 DIAGNOSIS — E669 Obesity, unspecified: Secondary | ICD-10-CM | POA: Diagnosis not present

## 2022-11-26 DIAGNOSIS — R7303 Prediabetes: Secondary | ICD-10-CM

## 2022-11-26 DIAGNOSIS — F17200 Nicotine dependence, unspecified, uncomplicated: Secondary | ICD-10-CM

## 2022-11-26 DIAGNOSIS — Z6838 Body mass index (BMI) 38.0-38.9, adult: Secondary | ICD-10-CM

## 2022-11-26 MED ORDER — VITAMIN D (ERGOCALCIFEROL) 1.25 MG (50000 UNIT) PO CAPS: 50000.0000 [IU] | ORAL_CAPSULE | ORAL | 0 refills | Status: DC

## 2022-11-26 NOTE — Progress Notes (Signed)
Carolyn Espinoza, D.O.  ABFM, ABOM Specializing in Clinical Bariatric Medicine  Office located at: [address] [place], [place]  [address]     Assessment and Plan:    Medications Discontinued During This Encounter  Medication Reason   Vitamin D, Ergocalciferol, (DRISDOL) 1.25 MG (50000 UNIT) CAPS capsule Reorder     Meds ordered this encounter  Medications   Vitamin D, Ergocalciferol, (DRISDOL) 1.25 MG (50000 UNIT) CAPS capsule    Sig: Take 1 capsule (50,000 Units total) by mouth every 7 (seven) days.    Dispense:  4 capsule    Refill:  0      Prediabetes Assessment: Condition is not optimized. Lab Results  Component Value Date   HGBA1C 6.0 (H) 09/25/2022   HGBA1C 5.6 10/17/2021   HGBA1C 5.2 03/31/2018   INSULIN 18.6 09/25/2022  - Patient not on any medication for this condition. This is diet controlled. - Hunger and cravings are controlled when eating on plan.   Plan: - Continue her prudent nutritional plan that is low in simple carbohydrates, saturated fats and trans fats to goal of 5-10% weight loss to achieve significant health benefits.  Pt encouraged to continually advance exercise and cardiovascular fitness as tolerated throughout weight loss journey.     Vitamin D deficiency Assessment: Condition is not optimized, poorly controlled. Lab Results  Component Value Date   VD25OH 10.1 (L) 09/25/2022  - Patient has been more compliant with Ergocalciferol 50K IU weekly. Denies any side effects.   Plan: - Continue with med as prescribed. Will refill this today.   - weight loss will likely improve availability of vitamin D, thus encouraged Carolyn Espinoza to continue with meal plan and their weight loss efforts to further improve this condition.    Tobacco abuse Pt has been trying to decrease the amount of cigs/ day.   H/o 1.5 packs per day for 30 yrs prior.  Prior to today, Carolyn Espinoza was at 1/2 ppd.  Carolyn Espinoza is down to 8 cig/d now.  Carolyn Espinoza was given wellbutrin at last OV to  help with this.  Carolyn Espinoza did not pick it up at pharmacy yet.  Carolyn Espinoza has some additional questions today about it The patient was counseled on the dangers of tobacco use, and was advised to quit.  Reviewed strategies to maximize success, including removing cigarettes and smoking materials from environment, substitution of other forms of reinforcement, support of family/friends, and pharmacotherapy  Plan: Smoking cessation instruction/counseling given of more than 10 minutes:  counseled patient on the dangers of tobacco use, advised patient to stop smoking, and reviewed strategies to maximize success. Carolyn Espinoza was advised to start wellbutrin after Risks/Benefits of medication discussed with pt again.  All concerns addressed. Carolyn Espinoza agrees to pick up med and start in near future. Quit date to be determined after next OV     Adjustment disorder with depressed mood- care giver stress + emotional eating Assessment: Condition is not optimized.  - Denies any SI/HI. Mood is stable. - Carolyn Espinoza endorses that her care giver stress has gotten better recently.  - Patient endorses that Carolyn Espinoza has not started the Wellbutrin yet because the Pharmacy did not have it ready. It is ready now and Carolyn Espinoza will pick up the medication today.   Plan:  - Start Wellbutrin 100 mg BID- Carolyn Espinoza will pick up script and start. I informed patient to take 1 tablet in the morning for 3-4 days, and if tolerated increase to 1 tablet in the afternoon.  -  Continue her prudent nutritional plan that is low in simple carbohydrates, saturated fats and trans fats to goal of 5-10% weight loss to achieve significant health benefits.  Pt encouraged to continually advance exercise and cardiovascular fitness as tolerated throughout weight loss journey.     TREATMENT PLAN FOR OBESITY: BMI 38.0-38.9,adult-current bmi 38.8 Obesity with starting BMI of 39.0/date 09/25/22 Assessment: Condition is stable. Biometric data collected today, was reviewed with patient.  Fat mass has  increased by 0.8 lb. Muscle mass has decreased by 1.2 lb. Total body water has decreased by 1.2 lb.   Plan:  - Carolyn Espinoza is currently in the action stage of change. As such, her goal is to continue weight management plan. Carolyn Espinoza will work on healthier eating habits and continue the Category 2 meal plan with breakfast and lunch options. - I again reviewed the Category 2 meal plan and lunch options handout with the patient and showed her different meals that Carolyn Espinoza can have for lunch and dinner.   Behavioral Intervention Additional resources provided today: category 2 meal plan information and lunch options Evidence-based interventions for health behavior change were utilized today including the discussion of self monitoring techniques, problem-solving barriers and SMART goal setting techniques.   Regarding patient's less desirable eating habits and patterns, we employed the technique of small changes.  Pt will specifically work on: continue prudent nutritional plan for next visit.    Recommended Physical Activity Goals Carolyn Espinoza has been advised to work up to 150 minutes of moderate intensity aerobic activity a week and strengthening exercises 2-3 times per week for cardiovascular health, weight loss maintenance and preservation of muscle mass.  Carolyn Espinoza has agreed to Continue current level of physical activity   FOLLOW UP: Return in about 2 weeks (around 12/10/2022). Carolyn Espinoza was informed of the importance of frequent follow up visits to maximize her success with intensive lifestyle modifications for her multiple health conditions.  Subjective:   Chief complaint: Obesity Carolyn Espinoza is here to discuss her progress with her obesity treatment plan. Carolyn Espinoza is on the the Category 2 Plan and states Carolyn Espinoza is following her eating plan approximately 80% of the time. Carolyn Espinoza states Carolyn Espinoza is not exercising.   Interval History:  Carolyn Espinoza is here for a follow up office visit.  Since last office visit Carolyn Espinoza: - Carolyn Espinoza has been eating 8  ounces of [place] Necks and more than 1 cup of beans lately.  - Carolyn Espinoza endorses that Carolyn Espinoza is "tired of eating sandwiches" - Carolyn Espinoza states that her care-giver stress has gotten better recently. -  Carolyn Espinoza has reduced her cigarette smoking to 8 a day.  -  When eating on plan, her hunger and cravings are well controlled.      Review of Systems:  Pertinent positives were addressed with patient today.   Weight Summary and Biometrics   Weight Lost Since Last Visit: 0  Weight Gained Since Last Visit: 0    Vitals Temp: 97.9 F (36.6 C) BP: 127/83 Pulse Rate: 82 SpO2: 90 %   Anthropometric Measurements Height: 5\' 3"  (1.6 m) Weight: 219 lb (99.3 kg) BMI (Calculated): 38.8 Weight at Last Visit: 219 lb Weight Lost Since Last Visit: 0 Weight Gained Since Last Visit: 0 Starting Weight: 220 lb Total Weight Loss (lbs): 1 lb (0.454 kg) Peak Weight: 220 lb   Body Composition  Body Fat %: 44.7 % Fat Mass (lbs): 98 lbs Muscle Mass (lbs): 115.2 lbs Total Body Water (lbs): 76.4 lbs Visceral Fat Rating : 14  Other Clinical Data Fasting: No Labs: No Today's Visit #: 5 Starting Date: 09/25/22   Objective:   PHYSICAL EXAM: Blood pressure 127/83, pulse 82, temperature 97.9 F (36.6 C), height 5\' 3"  (1.6 m), weight 219 lb (99.3 kg), last menstrual period 02/11/2016, SpO2 90 %. Body mass index is 38.79 kg/m.  General: Well Developed, well nourished, and in no acute distress.  HEENT: Normocephalic, atraumatic Skin: Warm and dry, cap RF less 2 sec, good turgor Chest:  Normal excursion, shape, no gross abn Respiratory: speaking in full sentences, no conversational dyspnea NeuroM-Sk: Ambulates w/o assistance, moves * 4 Psych: A and O *3, insight good, mood-full  DIAGNOSTIC DATA REVIEWED:  BMET    Component Value Date/Time   NA 140 09/25/2022 1031   K 4.7 09/25/2022 1031   CL 98 09/25/2022 1031   CO2 19 (L) 09/25/2022 1031   GLUCOSE 72 09/25/2022 1031   GLUCOSE 116 (H) 04/28/2020  1358   BUN 7 09/25/2022 1031   CREATININE 0.80 09/25/2022 1031   CALCIUM 9.6 09/25/2022 1031   GFRNONAA >60 04/28/2020 1358   GFRAA 90 05/10/2019 0855   Lab Results  Component Value Date   HGBA1C 6.0 (H) 09/25/2022   HGBA1C 5.2 03/31/2018   Lab Results  Component Value Date   INSULIN 18.6 09/25/2022   Lab Results  Component Value Date   TSH 0.671 09/25/2022   CBC    Component Value Date/Time   WBC 9.6 09/25/2022 1031   WBC 8.6 04/28/2020 1358   RBC 5.50 (H) 09/25/2022 1031   RBC 4.79 04/28/2020 1358   HGB 16.4 (H) 09/25/2022 1031   HCT 50.9 (H) 09/25/2022 1031   PLT 241 09/25/2022 1031   MCV 93 09/25/2022 1031   MCH 29.8 09/25/2022 1031   MCH 31.7 04/28/2020 1358   MCHC 32.2 09/25/2022 1031   MCHC 33.8 04/28/2020 1358   RDW 13.2 09/25/2022 1031   Iron Studies No results found for: "IRON", "TIBC", "FERRITIN", "IRONPCTSAT" Lipid Panel     Component Value Date/Time   CHOL 180 09/25/2022 1031   TRIG 85 09/25/2022 1031   HDL 55 09/25/2022 1031   CHOLHDL 3.2 03/01/2022 1113   CHOLHDL 3.3 Ratio 02/24/2009 2137   VLDL 29 02/24/2009 2137   LDLCALC 109 (H) 09/25/2022 1031   Hepatic Function Panel     Component Value Date/Time   PROT 8.1 09/25/2022 1031   ALBUMIN 4.2 09/25/2022 1031   AST 18 09/25/2022 1031   ALT 15 09/25/2022 1031   ALKPHOS 67 09/25/2022 1031   BILITOT 0.4 09/25/2022 1031      Component Value Date/Time   TSH 0.671 09/25/2022 1031   Nutritional Lab Results  Component Value Date   VD25OH 10.1 (L) 09/25/2022    Attestations:   Reviewed by clinician on day of visit: allergies, medications, problem list, medical history, surgical history, family history, social history, and previous encounter notes.    I,Special Puri,acting as a Neurosurgeon for [employer], DO.,have documented all relevant documentation on the behalf of Thomasene Lot, DO,as directed by  Thomasene Lot, DO while in the presence of Thomasene Lot, DO.   I, Thomasene Lot, DO, have reviewed all documentation for this visit. The documentation on 11/27/22 for the exam, diagnosis, procedures, and orders are all accurate and complete.

## 2022-12-10 ENCOUNTER — Ambulatory Visit (INDEPENDENT_AMBULATORY_CARE_PROVIDER_SITE_OTHER): Payer: BC Managed Care – PPO | Admitting: Family Medicine

## 2022-12-10 NOTE — Progress Notes (Incomplete)
Carolyn Espinoza, D.O.  ABFM, ABOM Specializing in Clinical Bariatric Medicine  Office located at: [address] [place], [place]  [address]     Assessment and Plan:   No orders of the defined types were placed in this encounter.   There are no discontinued medications.   No orders of the defined types were placed in this encounter.    *** There are no diagnoses linked to this encounter.     TREATMENT PLAN FOR OBESITY:   Assessment:  Carolyn Espinoza is here to discuss her progress with her obesity treatment plan along with follow-up of her obesity related diagnoses. See Medical Weight Management Flowsheet for complete bioelectrical impedance results.  Condition is {docourse:29403:::1}. {BiometricData (Optional):29179}  Since last office visit on *** patient's Fat mass has {DID:29233} by ***lb. Muscle mass has {DID:29233} by ***lb. Total body water has {DID:29233} by ***lb.  Counseling done on how various foods will affect these numbers and how to maximize success  Total lbs lost to date: *** Total weight loss percentage to date: *** ( use https://www.[employer].si )  Plan:  Carolyn Espinoza is currently in the action stage of change. As such, her goal is to continue her weight management plan. Carolyn Espinoza will work on healthier eating habits and try to follow the {mealplan:29239} best they can.   Behavioral Intervention Additional resources provided today: {weightresources:29185} Evidence-based interventions for health behavior change were utilized today including the discussion of self monitoring techniques, problem-solving barriers and SMART goal setting techniques.   Regarding patient's less desirable eating habits and patterns, we employed the technique of small changes.  Pt will specifically work on: *** for next visit.    Recommended Physical Activity Goals  Carolyn Espinoza has been advised to slowly work up to 150 minutes of moderate intensity  aerobic activity a week and strengthening exercises 2-3 times per week for cardiovascular health, weight loss maintenance and preservation of muscle mass.   She has agreed to {EMEXERCISE:28847::"Think about ways to increase daily physical activity and overcoming barriers to exercise"}  Pharmacotherapy Current Anti-obesity medications: ***. Reported side effects: ***. We discussed various medication options to help Carolyn Espinoza with her weight loss efforts and we both agreed to ***.( or Patient prefers to not start any weight loss medications at this time.   FOLLOW UP: No follow-ups on file.*** She was informed of the importance of frequent follow up visits to maximize her success with intensive lifestyle modifications for her multiple health conditions.  Subjective:   Chief complaint: Obesity Carolyn Espinoza is here to discuss her progress with her obesity treatment plan. She is on the {MWMwtlossportion/plan2:23431} and states she is following her eating plan approximately ***% of the time. She states she is exercising *** minutes *** days per week.  Interval History:  Carolyn Espinoza is here for a follow up office visit.     Since last office visit:  ***  We reviewed her meal plan and all questions were answered. Patient's food recall appears to be accurate and consistent with what is on plan when she is following it. When eating on plan, her hunger and cravings are well controlled.      Pharmacotherapy for weight loss: She {srtis (Optional):29129} currently taking {srtpreviousweightlossmeds (Optional):29124} for medical weight loss.  Denies side effects.    Review of Systems:  Pertinent positives were addressed with patient today.  Weight Summary and Biometrics   No data recorded No data recorded ***  No data recorded No data recorded No data  recorded No data recorded   Objective:   PHYSICAL EXAM: Last menstrual period 02/11/2016. There is no height or weight on file to calculate  BMI.  General: Well Developed, well nourished, and in no acute distress.  HEENT: Normocephalic, atraumatic Skin: Warm and dry, cap RF less 2 sec, good turgor Chest:  Normal excursion, shape, no gross abn Respiratory: speaking in full sentences, no conversational dyspnea NeuroM-Sk: Ambulates w/o assistance, moves * 4 Psych: A and O *3, insight good, mood-full  DIAGNOSTIC DATA REVIEWED:  BMET    Component Value Date/Time   NA 140 09/25/2022 1031   K 4.7 09/25/2022 1031   CL 98 09/25/2022 1031   CO2 19 (L) 09/25/2022 1031   GLUCOSE 72 09/25/2022 1031   GLUCOSE 116 (H) 04/28/2020 1358   BUN 7 09/25/2022 1031   CREATININE 0.80 09/25/2022 1031   CALCIUM 9.6 09/25/2022 1031   GFRNONAA >60 04/28/2020 1358   GFRAA 90 05/10/2019 0855   Lab Results  Component Value Date   HGBA1C 6.0 (H) 09/25/2022   HGBA1C 5.2 03/31/2018   Lab Results  Component Value Date   INSULIN 18.6 09/25/2022   Lab Results  Component Value Date   TSH 0.671 09/25/2022   CBC    Component Value Date/Time   WBC 9.6 09/25/2022 1031   WBC 8.6 04/28/2020 1358   RBC 5.50 (H) 09/25/2022 1031   RBC 4.79 04/28/2020 1358   HGB 16.4 (H) 09/25/2022 1031   HCT 50.9 (H) 09/25/2022 1031   PLT 241 09/25/2022 1031   MCV 93 09/25/2022 1031   MCH 29.8 09/25/2022 1031   MCH 31.7 04/28/2020 1358   MCHC 32.2 09/25/2022 1031   MCHC 33.8 04/28/2020 1358   RDW 13.2 09/25/2022 1031   Iron Studies No results found for: "IRON", "TIBC", "FERRITIN", "IRONPCTSAT" Lipid Panel     Component Value Date/Time   CHOL 180 09/25/2022 1031   TRIG 85 09/25/2022 1031   HDL 55 09/25/2022 1031   CHOLHDL 3.2 03/01/2022 1113   CHOLHDL 3.3 Ratio 02/24/2009 2137   VLDL 29 02/24/2009 2137   LDLCALC 109 (H) 09/25/2022 1031   Hepatic Function Panel     Component Value Date/Time   PROT 8.1 09/25/2022 1031   ALBUMIN 4.2 09/25/2022 1031   AST 18 09/25/2022 1031   ALT 15 09/25/2022 1031   ALKPHOS 67 09/25/2022 1031   BILITOT 0.4  09/25/2022 1031      Component Value Date/Time   TSH 0.671 09/25/2022 1031   Nutritional Lab Results  Component Value Date   VD25OH 10.1 (L) 09/25/2022    Attestations:   Reviewed by clinician on day of visit: allergies, medications, problem list, medical history, surgical history, family history, social history, and previous encounter notes.   Patient was in the office today and time spent on visit including pre-visit chart review and post-visit care/coordination of care and electronic medical record documentation was *** minutes. 50% of the time was in face to face counseling of this patient's medical condition(s) and providing education on treatment options to include the first-line treatment of diet and lifestyle modification.   I,Special Puri,acting as a Neurosurgeon for [employer], DO.,have documented all relevant documentation on the behalf of Thomasene Lot, DO,as directed by  Thomasene Lot, DO while in the presence of Thomasene Lot, DO.   I, Thomasene Lot, DO, have reviewed all documentation for this visit. The documentation on 12/10/22 for the exam, diagnosis, procedures, and orders are all accurate and complete.

## 2022-12-24 ENCOUNTER — Other Ambulatory Visit (INDEPENDENT_AMBULATORY_CARE_PROVIDER_SITE_OTHER): Payer: Self-pay | Admitting: Primary Care

## 2022-12-24 DIAGNOSIS — I10 Essential (primary) hypertension: Secondary | ICD-10-CM

## 2022-12-24 NOTE — Telephone Encounter (Signed)
Requested Prescriptions  Pending Prescriptions Disp Refills   lisinopril-hydrochlorothiazide (ZESTORETIC) 20-12.5 MG tablet [Pharmacy Med Name: LISINOPRIL-HCTZ 20/12.5MG  TABLETS] 180 tablet 0    Sig: TAKE 2 TABLETS BY MOUTH DAILY     Cardiovascular:  ACEI + Diuretic Combos Passed - 12/24/2022  3:07 PM      Passed - Na in normal range and within 180 days    Sodium  Date Value Ref Range Status  09/25/2022 140 134 - 144 mmol/L Final         Passed - K in normal range and within 180 days    Potassium  Date Value Ref Range Status  09/25/2022 4.7 3.5 - 5.2 mmol/L Final         Passed - Cr in normal range and within 180 days    Creatinine, Ser  Date Value Ref Range Status  09/25/2022 0.80 0.57 - 1.00 mg/dL Final         Passed - eGFR is 30 or above and within 180 days    GFR calc Af Amer  Date Value Ref Range Status  05/10/2019 90 >59 mL/min/1.73 Final   GFR, Estimated  Date Value Ref Range Status  04/28/2020 >60 >60 mL/min Final   eGFR  Date Value Ref Range Status  09/25/2022 85 >59 mL/min/1.73 Final         Passed - Patient is not pregnant      Passed - Last BP in normal range    BP Readings from Last 1 Encounters:  11/26/22 127/83         Passed - Valid encounter within last 6 months    Recent Outpatient Visits           4 months ago Need for immunization against influenza   [place] [facility] Grayce Sessions, NP   9 months ago Need for shingles vaccine   [place] [facility] Grayce Sessions, NP   1 year ago Screening for diabetes mellitus   [place] [facility] Grayce Sessions, NP   1 year ago Tobacco use disorder   [place] [facility] Grayce Sessions, NP   2 years ago Screening for malignant neoplasm of cervix   [place] [facility] Grayce Sessions, NP       Future Appointments             In 3 weeks Randa Evens, Kinnie Scales, NP [place]  [facility]

## 2022-12-26 ENCOUNTER — Encounter (INDEPENDENT_AMBULATORY_CARE_PROVIDER_SITE_OTHER): Payer: Self-pay | Admitting: Family Medicine

## 2022-12-26 ENCOUNTER — Ambulatory Visit (INDEPENDENT_AMBULATORY_CARE_PROVIDER_SITE_OTHER): Payer: BC Managed Care – PPO | Admitting: Family Medicine

## 2022-12-26 VITALS — BP 101/67 | HR 72 | Temp 98.2°F | Ht 63.0 in | Wt 217.0 lb

## 2022-12-26 DIAGNOSIS — Z6838 Body mass index (BMI) 38.0-38.9, adult: Secondary | ICD-10-CM

## 2022-12-26 DIAGNOSIS — I10 Essential (primary) hypertension: Secondary | ICD-10-CM

## 2022-12-26 DIAGNOSIS — F17200 Nicotine dependence, unspecified, uncomplicated: Secondary | ICD-10-CM | POA: Diagnosis not present

## 2022-12-26 DIAGNOSIS — E559 Vitamin D deficiency, unspecified: Secondary | ICD-10-CM | POA: Diagnosis not present

## 2022-12-26 DIAGNOSIS — E669 Obesity, unspecified: Secondary | ICD-10-CM

## 2022-12-26 DIAGNOSIS — F4321 Adjustment disorder with depressed mood: Secondary | ICD-10-CM

## 2022-12-26 MED ORDER — VITAMIN D (ERGOCALCIFEROL) 1.25 MG (50000 UNIT) PO CAPS
50000.0000 [IU] | ORAL_CAPSULE | ORAL | 0 refills | Status: DC
Start: 2022-12-26 — End: 2023-01-16

## 2022-12-26 MED ORDER — BUPROPION HCL ER (SR) 100 MG PO TB12: 100.0000 mg | ORAL_TABLET | Freq: Two times a day (BID) | ORAL | 0 refills | Status: DC

## 2022-12-26 NOTE — Progress Notes (Signed)
[name], D.O.  ABFM, ABOM Specializing in Clinical Bariatric Medicine  Office located at: [address] [place], [place]  [address]     Assessment and Plan:   Medications Discontinued During This Encounter  Medication Reason   buPROPion ER (WELLBUTRIN SR) 100 MG 12 hr tablet Reorder   Vitamin D, Ergocalciferol, (DRISDOL) 1.25 MG (50000 UNIT) CAPS capsule Reorder     Meds ordered this encounter  Medications   buPROPion ER (WELLBUTRIN SR) 100 MG 12 hr tablet    Sig: Take 1 tablet (100 mg total) by mouth 2 (two) times daily.    Dispense:  60 tablet    Refill:  0   Vitamin D, Ergocalciferol, (DRISDOL) 1.25 MG (50000 UNIT) CAPS capsule    Sig: Take 1 capsule (50,000 Units total) by mouth every 7 (seven) days.    Dispense:  4 capsule    Refill:  0     Vitamin D deficiency Assessment: Condition is not optimized.  Lab Results  Component Value Date   VD25OH 10.1 (L) 09/25/2022  - No issues with Ergocalciferol 50K IU weekly since last labs were reviewed. Denies any adverse effects.  Plan: - Continue with OTC supplement. Will reorder this today.   - Will continue to  monitor levels regularly (every 3-4 mo on average) to keep levels within normal limits and prevent over supplementation.    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 - Denies any SI/HI. Mood is stable. - Patient endorses doing less emotional eating since starting the Wellbutrin 100 mg BID. Denies any adverse side effects of med.  - Patient is interested in quitting smoking soon.   Plan: - Continue with mood medication. Will refill this today.   - Continue her prudent nutritional plan that is low in simple carbohydrates, saturated fats and trans fats to goal of 5-10% weight loss to achieve significant health benefits.  - Continue to monitor closely.    Essential  hypertension Assessment: Condition is improving and stable. Last 3 blood pressure readings in our office are as follows: BP Readings from Last 3 Encounters:  12/26/22 101/67  11/26/22 127/83  11/07/22 131/85  - Her blood pressure is stable today. No new symptoms of dizziness or lightheadedness. - She has been compliant and tolerant with Norvasc 10 mg daily and Zestoretic 20-12.5 mg daily. Denies any adverse effects.  Plan: - Continue with all antihypertensive medications as recommended by PCP.  - I instructed pt to check blood pressure 3-4 times per week at home and bring her blood pressure log next OV.  - Continue with Prudent nutritional plan and low sodium diet, advance exercise as tolerated.   - We will continue to monitor symptoms as they relate to the her weight loss journey.    TREATMENT PLAN FOR OBESITY: BMI 38.0-38.9,adult-current bmi 38.45 Obesity with starting BMI of 39.0/date 09/25/22 Assessment:  Carolyn Espinoza is here to discuss her progress with her obesity treatment plan along with follow-up of her obesity related diagnoses. See Medical Weight Management Flowsheet for complete bioelectrical impedance results.  Condition is improving. Biometric data collected today, was reviewed with patient.   Since last office visit on 11/26/22 patient's  Muscle mass has increased by 0.6 lb. Fat mass has decreased by 2.8 lb. Total body water has decreased by 1.6 lb.  Counseling done on how various foods will affect these numbers and how to maximize  success  Total lbs lost to date: 3  Total weight loss percentage to date: 1.36   Plan:  - Continue Category 2 meal plan with lunch options.   - Counseling was done again on how pt can obtain her protein goals based on the meal plan prescribed to her.   Behavioral Intervention Additional resources provided today: category 2 meal plan information Evidence-based interventions for health behavior change were utilized today including the  discussion of self monitoring techniques, problem-solving barriers and SMART goal setting techniques.   Regarding patient's less desirable eating habits and patterns, we employed the technique of small changes.  Pt will specifically work on: preparing to quit tobacco use by July 4th and bring blood pressure log for next visit.    Recommended Physical Activity Goals  Carolyn Espinoza has been advised to slowly work up to 150 minutes of moderate intensity aerobic activity a week and strengthening exercises 2-3 times per week for cardiovascular health, weight loss maintenance and preservation of muscle mass.   She has agreed to Continue current level of physical activity    FOLLOW UP: Return in 2-3 wks. She was informed of the importance of frequent follow up visits to maximize her success with intensive lifestyle modifications for her multiple health conditions.  Subjective:   Chief complaint: Obesity Carolyn Espinoza is here to discuss her progress with her obesity treatment plan. She is on the the Category 2 Plan with lunch options and states she is following her eating plan approximately 70% of the time. She states she is walking 15 minutes 1 days per week.  Interval History:  Carolyn Espinoza is here for a follow up office visit.     Since last office visit:    - She is interested in completely quitting smoking.  - Has been doing less emotional eating since starting the Wellbutrin.   - Her toughest challenge is getting in all the food on meal plan.   Pharmacotherapy for weight loss: She is currently taking Wellbutrin for medical weight loss.  Denies side effects.    Review of Systems:  Pertinent positives were addressed with patient today.  Weight Summary and Biometrics   Weight Lost Since Last Visit: 2  Weight Gained Since Last Visit: 0    Vitals Temp: 98.2 F (36.8 C) BP: 101/67 Pulse Rate: 72 SpO2: 96 %   Anthropometric Measurements Height: 5\' 3"  (1.6 m) Weight: 217 lb (98.4 kg) BMI  (Calculated): 38.45 Weight at Last Visit: 219lb Weight Lost Since Last Visit: 2 Weight Gained Since Last Visit: 0 Starting Weight: 220lb Total Weight Loss (lbs): 3 lb (1.361 kg) Peak Weight: 220lb   Body Composition  Body Fat %: 43.8 % Fat Mass (lbs): 95.2 lbs Muscle Mass (lbs): 115.8 lbs Total Body Water (lbs): 74.8 lbs Visceral Fat Rating : 13    Objective:   PHYSICAL EXAM: Blood pressure 101/67, pulse 72, temperature 98.2 F (36.8 C), height 5\' 3"  (1.6 m), weight 217 lb (98.4 kg), last menstrual period 02/11/2016, SpO2 96 %. Body mass index is 38.44 kg/m.  General: Well Developed, well nourished, and in no acute distress.  HEENT: Normocephalic, atraumatic Skin: Warm and dry, cap RF less 2 sec, good turgor Chest:  Normal excursion, shape, no gross abn Respiratory: speaking in full sentences, no conversational dyspnea NeuroM-Sk: Ambulates w/o assistance, moves * 4 Psych: A and O *3, insight good, mood-full  DIAGNOSTIC DATA REVIEWED:  BMET    Component Value Date/Time   NA 140 09/25/2022 1031  K 4.7 09/25/2022 1031   CL 98 09/25/2022 1031   CO2 19 (L) 09/25/2022 1031   GLUCOSE 72 09/25/2022 1031   GLUCOSE 116 (H) 04/28/2020 1358   BUN 7 09/25/2022 1031   CREATININE 0.80 09/25/2022 1031   CALCIUM 9.6 09/25/2022 1031   GFRNONAA >60 04/28/2020 1358   GFRAA 90 05/10/2019 0855   Lab Results  Component Value Date   HGBA1C 6.0 (H) 09/25/2022   HGBA1C 5.2 03/31/2018   Lab Results  Component Value Date   INSULIN 18.6 09/25/2022   Lab Results  Component Value Date   TSH 0.671 09/25/2022   CBC    Component Value Date/Time   WBC 9.6 09/25/2022 1031   WBC 8.6 04/28/2020 1358   RBC 5.50 (H) 09/25/2022 1031   RBC 4.79 04/28/2020 1358   HGB 16.4 (H) 09/25/2022 1031   HCT 50.9 (H) 09/25/2022 1031   PLT 241 09/25/2022 1031   MCV 93 09/25/2022 1031   MCH 29.8 09/25/2022 1031   MCH 31.7 04/28/2020 1358   MCHC 32.2 09/25/2022 1031   MCHC 33.8 04/28/2020 1358    RDW 13.2 09/25/2022 1031   Iron Studies No results found for: "IRON", "TIBC", "FERRITIN", "IRONPCTSAT" Lipid Panel     Component Value Date/Time   CHOL 180 09/25/2022 1031   TRIG 85 09/25/2022 1031   HDL 55 09/25/2022 1031   CHOLHDL 3.2 03/01/2022 1113   CHOLHDL 3.3 Ratio 02/24/2009 2137   VLDL 29 02/24/2009 2137   LDLCALC 109 (H) 09/25/2022 1031   Hepatic Function Panel     Component Value Date/Time   PROT 8.1 09/25/2022 1031   ALBUMIN 4.2 09/25/2022 1031   AST 18 09/25/2022 1031   ALT 15 09/25/2022 1031   ALKPHOS 67 09/25/2022 1031   BILITOT 0.4 09/25/2022 1031      Component Value Date/Time   TSH 0.671 09/25/2022 1031   Nutritional Lab Results  Component Value Date   VD25OH 10.1 (L) 09/25/2022    Attestations:   Reviewed by clinician on day of visit: allergies, medications, problem list, medical history, surgical history, family history, social history, and previous encounter notes.  I,Special Puri,acting as a Neurosurgeon for [employer], DO.,have documented all relevant documentation on the behalf of Thomasene Lot, DO,as directed by  Thomasene Lot, DO while in the presence of Thomasene Lot, DO.   I, Thomasene Lot, DO, have reviewed all documentation for this visit. The documentation on 12/26/22 for the exam, diagnosis, procedures, and orders are all accurate and complete.

## 2023-01-15 ENCOUNTER — Other Ambulatory Visit (INDEPENDENT_AMBULATORY_CARE_PROVIDER_SITE_OTHER): Payer: Self-pay | Admitting: Primary Care

## 2023-01-15 DIAGNOSIS — I10 Essential (primary) hypertension: Secondary | ICD-10-CM

## 2023-01-15 DIAGNOSIS — R062 Wheezing: Secondary | ICD-10-CM

## 2023-01-16 ENCOUNTER — Encounter (INDEPENDENT_AMBULATORY_CARE_PROVIDER_SITE_OTHER): Payer: Self-pay | Admitting: Family Medicine

## 2023-01-16 ENCOUNTER — Ambulatory Visit (INDEPENDENT_AMBULATORY_CARE_PROVIDER_SITE_OTHER): Payer: BC Managed Care – PPO | Admitting: Family Medicine

## 2023-01-16 VITALS — BP 134/86 | HR 69 | Temp 98.3°F | Ht 63.0 in | Wt 218.0 lb

## 2023-01-16 DIAGNOSIS — F4321 Adjustment disorder with depressed mood: Secondary | ICD-10-CM

## 2023-01-16 DIAGNOSIS — I10 Essential (primary) hypertension: Secondary | ICD-10-CM

## 2023-01-16 DIAGNOSIS — Z6838 Body mass index (BMI) 38.0-38.9, adult: Secondary | ICD-10-CM

## 2023-01-16 DIAGNOSIS — E559 Vitamin D deficiency, unspecified: Secondary | ICD-10-CM | POA: Diagnosis not present

## 2023-01-16 DIAGNOSIS — F17210 Nicotine dependence, cigarettes, uncomplicated: Secondary | ICD-10-CM | POA: Diagnosis not present

## 2023-01-16 DIAGNOSIS — Z0000 Encounter for general adult medical examination without abnormal findings: Secondary | ICD-10-CM

## 2023-01-16 DIAGNOSIS — E669 Obesity, unspecified: Secondary | ICD-10-CM

## 2023-01-16 DIAGNOSIS — F17200 Nicotine dependence, unspecified, uncomplicated: Secondary | ICD-10-CM

## 2023-01-16 MED ORDER — VITAMIN D (ERGOCALCIFEROL) 1.25 MG (50000 UNIT) PO CAPS: 50000.0000 [IU] | ORAL_CAPSULE | ORAL | 0 refills | Status: DC

## 2023-01-16 MED ORDER — BUPROPION HCL ER (SR) 100 MG PO TB12
100.0000 mg | ORAL_TABLET | Freq: Two times a day (BID) | ORAL | 0 refills | Status: DC
Start: 2023-01-16 — End: 2023-02-06

## 2023-01-16 NOTE — Telephone Encounter (Signed)
30-day courtesy given has appointment scheduled. Requested Prescriptions  Pending Prescriptions Disp Refills   albuterol (VENTOLIN HFA) 108 (90 Base) MCG/ACT inhaler [Pharmacy Med Name: ALBUTEROL HFA INH (200 PUFFS) 6.7GM] 6.7 g 0    Sig: INHALE 2 PUFFS INTO THE LUNGS EVERY 4 HOURS AS NEEDED FOR WHEEZING OR SHORTNESS OF BREATH     Pulmonology:  Beta Agonists 2 Passed - 01/15/2023  6:22 PM      Passed - Last BP in normal range    BP Readings from Last 1 Encounters:  01/16/23 134/86         Passed - Last Heart Rate in normal range    Pulse Readings from Last 1 Encounters:  01/16/23 69         Passed - Valid encounter within last 12 months    Recent Outpatient Visits           4 months ago Need for immunization against influenza   [place] [facility] Grayce Sessions, NP   10 months ago Need for shingles vaccine   [place] [facility] Grayce Sessions, NP   1 year ago Screening for diabetes mellitus   [place] [facility] Grayce Sessions, NP   1 year ago Tobacco use disorder   [place] [facility] Grayce Sessions, NP   2 years ago Screening for malignant neoplasm of cervix   [place] [facility] Grayce Sessions, NP       Future Appointments             In 4 days Grayce Sessions, NP [place] [facility]             amLODipine (NORVASC) 10 MG tablet [Pharmacy Med Name: AMLODIPINE BESYLATE 10MG  TABLETS] 30 tablet 0    Sig: TAKE 1 TABLET(10 MG) BY MOUTH DAILY     Cardiovascular: Calcium Channel Blockers 2 Passed - 01/15/2023  6:22 PM      Passed - Last BP in normal range    BP Readings from Last 1 Encounters:  01/16/23 134/86         Passed - Last Heart Rate in normal range    Pulse Readings from Last 1 Encounters:  01/16/23 69         Passed - Valid encounter within last 6 months    Recent Outpatient Visits           4 months  ago Need for immunization against influenza   [place] [facility] Grayce Sessions, NP   10 months ago Need for shingles vaccine   [place] [facility] Grayce Sessions, NP   1 year ago Screening for diabetes mellitus   [place] [facility] Grayce Sessions, NP   1 year ago Tobacco use disorder   [place] [facility] Grayce Sessions, NP   2 years ago Screening for malignant neoplasm of cervix    [facility] Grayce Sessions, NP       Future Appointments             In 4 days Randa Evens, Kinnie Scales, NP  [facility]

## 2023-01-16 NOTE — Progress Notes (Signed)
Carolyn Espinoza, D.O.  ABFM, ABOM Specializing in Clinical Bariatric Medicine  Office located at: [address] [place], [place]  [address]     Assessment and Plan:   Medications Discontinued During This Encounter  Medication Reason   buPROPion ER (WELLBUTRIN SR) 100 MG 12 hr tablet Reorder   Vitamin D, Ergocalciferol, (DRISDOL) 1.25 MG (50000 UNIT) CAPS capsule Reorder     Meds ordered this encounter  Medications   buPROPion ER (WELLBUTRIN SR) 100 MG 12 hr tablet    Sig: Take 1 tablet (100 mg total) by mouth 2 (two) times daily.    Dispense:  60 tablet    Refill:  0   Vitamin D, Ergocalciferol, (DRISDOL) 1.25 MG (50000 UNIT) CAPS capsule    Sig: Take 1 capsule (50,000 Units total) by mouth every 7 (seven) days.    Dispense:  4 capsule    Refill:  0     Essential hypertension Assessment: Her blood pressure is stable. No concerns in this regard today. No issues with Norvasc 10 mg daily and Zestoretic 20-12.5 mg two tablets by mouth daily. Denies any side effects.   Last 3 blood pressure readings in our office are as follows: BP Readings from Last 3 Encounters:  01/16/23 134/86  12/26/22 101/67  11/26/22 127/83   Plan: Continue with both blood pressure medications at current doses.   Continue to avoid buying foods that are: processed, frozen, or prepackaged to avoid excess salt. Ambulatory blood pressure monitoring encouraged.  Reminded patient that if they ever feel poorly in any way, to check their blood pressure and pulse as well.We will continue to monitor closely alongside PCP/ specialists.  Pt reminded to also f/up with those individuals as instructed by them. We will continue to monitor symptoms as they relate to the her weight loss journey.    Adjustment disorder with depressed mood- care giver stress + emotional eating Tobacco use disorder Assessment: Denies any SI/HI. Mood is stable. Pt endorses that her care giver stress has increased due to her brothers  worsening dementia She has still been smoking cigarettes daily and desires to quit "cold [place]" on July 4th. Pt reports good compliance and tolerance with Wellbutrin 100 mg two times daily. No concerns.   Plan: Continue with Wellbutrin at current dose. Will refill today.    Carolyn Espinoza was provided different resources today that can help her with tobacco cessation. I showed her different websites that provide resources and ideas (ex. having sugar free lollipops) that can help her in the quitting process.I also provided her with the 1800 Quit Now hotline. Will continue to monitor condition.    Vitamin D deficiency Assessment: Carolyn Espinoza endorses that she forget to pick up her last prescription Vitamin D and has not taken ERGO the past 3-4 wks.   Lab Results  Component Value Date   VD25OH 10.1 (L) 09/25/2022   Plan: Pt agrees to pick up prescription Vitamin D from the pharmacy today and continue with treatment. Will refill ERGO today.   Weight loss will likely improve availability of vitamin D, thus encouraged Carolyn Espinoza to continue with meal plan and their weight loss efforts to further improve this condition.  Thus, we will need to monitor levels regularly (every 3-4 mo on average) to keep levels within normal limits and prevent over supplementation.   Healthcare maintenance Assessment: Notes reviewed from [facility] show that her last colonoscopy was on 06/2019.She was due for a repeat colonoscopy on 06/2022. Pt also endorses  that her breathing is impairing her overall quality of life and that it's hard to breath for her when it's humid.  Plan: I highly encouraged pt to contact her endocrinologist for a repeat colonoscopy. I also encouraged her to contact PCP about possibly getting a  referral to pulmonology for an official COPD evaluation.    TREATMENT PLAN FOR OBESITY: BMI 38.0-38.9,adult-current bmi 38.63 Obesity with starting BMI of 39.0/date 09/25/22 Assessment: Carolyn Espinoza is here to  discuss her progress with her obesity treatment plan along with follow-up of her obesity related diagnoses. See Medical Weight Management Flowsheet for complete bioelectrical impedance results.  Condition is not optimized. Biometric data collected today, was reviewed with patient.   Since last office visit on 12/26/22 patient's  Muscle mass has increased by 0.8 lb. Fat mass has increased by 0.6 lb. Total body water has increased by 3.6 lb.  Counseling done on how various foods will affect these numbers and how to maximize success  Total lbs lost to date: - 2  Total weight loss percentage to date: 0.91  Plan: Continue Category 2 meal plan with lunch options   - Briefly reviewed the Category 2 meal plan again with the pt and showed her low carb vegetables that she can have.   Behavioral Intervention Additional resources provided today: Baked Chicken Breast Handout, Category 2 meal plan, Low carbohydrate vegetables handout  Evidence-based interventions for health behavior change were utilized today including the discussion of self monitoring techniques, problem-solving barriers and SMART goal setting techniques.   Regarding patient's less desirable eating habits and patterns, we employed the technique of small changes.  Pt will specifically work on: preparing to quit tobacco use by July 4th and continue prescribed meal plan for next visit.    Recommended Physical Activity Goals  Carolyn Espinoza has been advised to slowly work up to 150 minutes of moderate intensity aerobic activity a week and strengthening exercises 2-3 times per week for cardiovascular health, weight loss maintenance and preservation of muscle mass.   She has agreed to Continue current level of physical activity   FOLLOW UP: Return in about 3 weeks (around 02/06/2023). She was informed of the importance of frequent follow up visits to maximize her success with intensive lifestyle modifications for her multiple health  conditions.  Subjective:   Chief complaint: Obesity Carolyn Espinoza is here to discuss her progress with her obesity treatment plan. She is on the the Category 2 Plan with lunch options and states she is following her eating plan approximately 60% of the time. She states she is walking and doing gym machines 30 minutes 4 days per week.  Interval History:  Carolyn Espinoza is here for a follow up office visit. Since last OV, Carolyn Espinoza has still been smoking daily and is preparing her mind for quitting by July 4th. She has been eating a lot of boneless skinless chicken breast and [place]. She endorses still skipping breakfast some days, eating off plan on a few occasions, and having a larger than recommended portion of starchy vegetables (beats)  Pharmacotherapy for weight loss: She is currently taking Wellbutrin for medical weight loss.  Denies side effects.    Review of Systems:  Pertinent positives were addressed with patient today.  Reviewed by clinician on day of visit: allergies, medications, problem list, medical history, surgical history, family history, social history, and previous encounter notes.  Weight Summary and Biometrics   Weight Lost Since Last Visit: 0lb  Weight Gained Since Last Visit: 1lb  Vitals Temp: 98.3 F (36.8 C) BP: 134/86 Pulse Rate: 69 SpO2: 96 %   Anthropometric Measurements Height: 5\' 3"  (1.6 m) Weight: 218 lb (98.9 kg) BMI (Calculated): 38.63 Weight at Last Visit: 217lb Weight Lost Since Last Visit: 0lb Weight Gained Since Last Visit: 1lb Starting Weight: 220lb Total Weight Loss (lbs): 2 lb (0.907 kg) Peak Weight: 220lb   Body Composition  Body Fat %: 43.8 % Fat Mass (lbs): 95.8 lbs Muscle Mass (lbs): 116.6 lbs Total Body Water (lbs): 78.4 lbs Visceral Fat Rating : 13   Other Clinical Data Fasting: no Labs: no Today's Visit #: 7 Starting Date: 09/25/22   Objective:   PHYSICAL EXAM: Blood pressure 134/86, pulse 69, temperature 98.3 F (36.8  C), height 5\' 3"  (1.6 m), weight 218 lb (98.9 kg), last menstrual period 02/11/2016, SpO2 96 %. Body mass index is 38.62 kg/m.  General: Well Developed, well nourished, and in no acute distress.  HEENT: Normocephalic, atraumatic Skin: Warm and dry, cap RF less 2 sec, good turgor Chest:  Normal excursion, shape, no gross abn Respiratory: speaking in full sentences, no conversational dyspnea NeuroM-Sk: Ambulates w/o assistance, moves * 4 Psych: A and O *3, insight good, mood-full  DIAGNOSTIC DATA REVIEWED:  BMET    Component Value Date/Time   NA 140 09/25/2022 1031   K 4.7 09/25/2022 1031   CL 98 09/25/2022 1031   CO2 19 (L) 09/25/2022 1031   GLUCOSE 72 09/25/2022 1031   GLUCOSE 116 (H) 04/28/2020 1358   BUN 7 09/25/2022 1031   CREATININE 0.80 09/25/2022 1031   CALCIUM 9.6 09/25/2022 1031   GFRNONAA >60 04/28/2020 1358   GFRAA 90 05/10/2019 0855   Lab Results  Component Value Date   HGBA1C 6.0 (H) 09/25/2022   HGBA1C 5.2 03/31/2018   Lab Results  Component Value Date   INSULIN 18.6 09/25/2022   Lab Results  Component Value Date   TSH 0.671 09/25/2022   CBC    Component Value Date/Time   WBC 9.6 09/25/2022 1031   WBC 8.6 04/28/2020 1358   RBC 5.50 (H) 09/25/2022 1031   RBC 4.79 04/28/2020 1358   HGB 16.4 (H) 09/25/2022 1031   HCT 50.9 (H) 09/25/2022 1031   PLT 241 09/25/2022 1031   MCV 93 09/25/2022 1031   MCH 29.8 09/25/2022 1031   MCH 31.7 04/28/2020 1358   MCHC 32.2 09/25/2022 1031   MCHC 33.8 04/28/2020 1358   RDW 13.2 09/25/2022 1031   Iron Studies No results found for: "IRON", "TIBC", "FERRITIN", "IRONPCTSAT" Lipid Panel     Component Value Date/Time   CHOL 180 09/25/2022 1031   TRIG 85 09/25/2022 1031   HDL 55 09/25/2022 1031   CHOLHDL 3.2 03/01/2022 1113   CHOLHDL 3.3 Ratio 02/24/2009 2137   VLDL 29 02/24/2009 2137   LDLCALC 109 (H) 09/25/2022 1031   Hepatic Function Panel     Component Value Date/Time   PROT 8.1 09/25/2022 1031    ALBUMIN 4.2 09/25/2022 1031   AST 18 09/25/2022 1031   ALT 15 09/25/2022 1031   ALKPHOS 67 09/25/2022 1031   BILITOT 0.4 09/25/2022 1031      Component Value Date/Time   TSH 0.671 09/25/2022 1031   Nutritional Lab Results  Component Value Date   VD25OH 10.1 (L) 09/25/2022    Attestations:   Patient was in the office today and time spent on visit including pre-visit chart review and post-visit care/coordination of care and electronic medical record documentation was 40 minutes. 50%  of the time was in face to face counseling of this patient's medical condition(s) and providing education on treatment options to include the first-line treatment of diet and lifestyle modification.   I, Special Randolm Idol, acting as a Stage manager for [employer], DO., have compiled all relevant documentation for today's office visit on behalf of Thomasene Lot, DO, while in the presence of [employer], DO.  I have reviewed the above documentation for accuracy and completeness, and I agree with the above. Carolyn Espinoza, D.O.  The 21st Century Cures Act was signed into law in 2016 which includes the topic of electronic health records.  This provides immediate access to information in MyChart.  This includes consultation notes, operative notes, office notes, lab results and pathology reports.  If you have any questions about what you read please let us know at your next visit so we can discuss your concerns and take corrective action if need be.  We are right here with you.

## 2023-01-20 ENCOUNTER — Ambulatory Visit (INDEPENDENT_AMBULATORY_CARE_PROVIDER_SITE_OTHER): Payer: BC Managed Care – PPO | Admitting: Primary Care

## 2023-01-20 ENCOUNTER — Encounter (INDEPENDENT_AMBULATORY_CARE_PROVIDER_SITE_OTHER): Payer: Self-pay | Admitting: Primary Care

## 2023-01-20 VITALS — BP 116/81 | HR 79 | Resp 16 | Wt 219.4 lb

## 2023-01-20 DIAGNOSIS — Z760 Encounter for issue of repeat prescription: Secondary | ICD-10-CM

## 2023-01-20 DIAGNOSIS — I10 Essential (primary) hypertension: Secondary | ICD-10-CM

## 2023-01-20 DIAGNOSIS — Z789 Other specified health status: Secondary | ICD-10-CM | POA: Diagnosis not present

## 2023-01-20 MED ORDER — LISINOPRIL-HYDROCHLOROTHIAZIDE 20-12.5 MG PO TABS
2.0000 | ORAL_TABLET | Freq: Every day | ORAL | 0 refills | Status: DC
Start: 2023-01-20 — End: 2023-07-08

## 2023-01-20 MED ORDER — AMLODIPINE BESYLATE 10 MG PO TABS
ORAL_TABLET | ORAL | 1 refills | Status: DC
Start: 2023-01-20 — End: 2023-07-08

## 2023-01-20 NOTE — Patient Instructions (Addendum)
Appt with weight management please send BP to PCP- decreased Lisinopril/hydrochlorothiazide to once daily and cont Amlodipine

## 2023-01-20 NOTE — Progress Notes (Signed)
[facility]  Carolyn Espinoza, is a 57 y.o. female  [id number]  [id number]  DOB - 1966/06/17  HTN      Subjective:   Carolyn Espinoza is a 57 y.o. female here today for a follow up visit HTN. Patient has No headache, No chest pain, No abdominal pain - No Nausea, No new weakness tingling or numbness, No Cough - shortness of breath  No problems updated.  No Known Allergies  Past Medical History:  Diagnosis Date   Asthma    Back pain    Constipation    HTN (hypertension) 07/22/2010    Current Outpatient Medications on File Prior to Visit  Medication Sig Dispense Refill   albuterol (VENTOLIN HFA) 108 (90 Base) MCG/ACT inhaler INHALE 2 PUFFS INTO THE LUNGS EVERY 4 HOURS AS NEEDED FOR WHEEZING OR SHORTNESS OF BREATH 6.7 g 0   amLODipine (NORVASC) 10 MG tablet TAKE 1 TABLET(10 MG) BY MOUTH DAILY 30 tablet 0   buPROPion ER (WELLBUTRIN SR) 100 MG 12 hr tablet Take 1 tablet (100 mg total) by mouth 2 (two) times daily. 60 tablet 0   lisinopril-hydrochlorothiazide (ZESTORETIC) 20-12.5 MG tablet TAKE 2 TABLETS BY MOUTH DAILY 180 tablet 0   Multiple Vitamin (MULTIVITAMIN WITH MINERALS) TABS tablet Take 1 tablet by mouth daily.     triamcinolone cream (KENALOG) 0.1 % Apply 1 Application topically 2 (two) times daily. 15 g 0   Vitamin D, Ergocalciferol, (DRISDOL) 1.25 MG (50000 UNIT) CAPS capsule Take 1 capsule (50,000 Units total) by mouth every 7 (seven) days. 4 capsule 0   No current facility-administered medications on file prior to visit.    Objective:  Blood Pressure 116/81   Pulse 79   Respiration 16   Weight 219 lb 6.4 oz (99.5 kg)   Last Menstrual Period 02/11/2016   Oxygen Saturation 95%   Body Mass Index 38.86 kg/m    Comprehensive ROS Pertinent positive and negative noted in HPI   Exam General appearance : Awake, alert, not in any distress. Speech Clear. Not toxic looking HEENT: Atraumatic and Normocephalic, pupils equally reactive to light and  accomodation Neck: Supple, no JVD. No cervical lymphadenopathy.  Chest: Good air entry bilaterally, no added sounds  CVS: S1 S2 regular, no murmurs.  Abdomen: Bowel sounds present, Non tender and not distended with no gaurding, rigidity or rebound. Extremities: B/L Lower Ext shows no edema, both legs are warm to touch Neurology: Awake alert, and oriented X 3, CN II-XII intact, Non focal Skin: No Rash  Data Review Lab Results  Component Value Date   HGBA1C 6.0 (H) 09/25/2022   HGBA1C 5.6 10/17/2021   HGBA1C 5.2 03/31/2018    Assessment & Plan  Carolyn Espinoza was seen today for hypertension.  Diagnoses and all orders for this visit:  Advised about management of weight Managed by [name], D.O.  ABFM, ABOM  Hypertension, unspecified type Well controlled-BP goal - < 130/80 Explained that having normal blood pressure is the goal and medications are helping to get to goal and maintain normal blood pressure. DIET: Limit salt intake, read nutrition labels to check salt content, limit fried and high fatty foods  Avoid using multisymptom OTC cold preparations that generally contain sudafed which can rise BP. Consult with pharmacist on best cold relief products to use for persons with HTN EXERCISE Discussed incorporating exercise such as walking - 30 minutes most days of the week and can do in 10 minute intervals     Medication refill -  amLODipine (NORVASC) 10 MG tablet; Take 1 tablet daily -     lisinopril-hydrochlorothiazide (ZESTORETIC) 20-12.5 MG tablet; Take 2 tablets by mouth daily.     Patient have been counseled extensively about nutrition and exercise. Other issues discussed during this visit include: low cholesterol diet, weight control and daily exercise, foot care, annual eye examinations at Ophthalmology, importance of adherence with medications and regular follow-up. We also discussed long term complications of uncontrolled diabetes and hypertension.   Return in about  3 months (around 04/22/2023).  The patient was given clear instructions to go to ER or return to medical center if symptoms don't improve, worsen or new problems develop. The patient verbalized understanding. The patient was told to call to get lab results if they haven't heard anything in the next week.   This note has been created with Education officer, environmental. Any transcriptional errors are unintentional.   Grayce Sessions, NP 01/20/2023, 1:42 PM

## 2023-01-25 ENCOUNTER — Encounter (INDEPENDENT_AMBULATORY_CARE_PROVIDER_SITE_OTHER): Payer: Self-pay | Admitting: Primary Care

## 2023-02-05 ENCOUNTER — Other Ambulatory Visit (HOSPITAL_COMMUNITY): Payer: Self-pay

## 2023-02-06 ENCOUNTER — Encounter (INDEPENDENT_AMBULATORY_CARE_PROVIDER_SITE_OTHER): Payer: Self-pay | Admitting: Family Medicine

## 2023-02-06 ENCOUNTER — Ambulatory Visit (INDEPENDENT_AMBULATORY_CARE_PROVIDER_SITE_OTHER): Payer: BC Managed Care – PPO | Admitting: Family Medicine

## 2023-02-06 VITALS — BP 147/83 | HR 67 | Temp 97.5°F | Ht 63.0 in | Wt 218.0 lb

## 2023-02-06 DIAGNOSIS — I10 Essential (primary) hypertension: Secondary | ICD-10-CM

## 2023-02-06 DIAGNOSIS — F4321 Adjustment disorder with depressed mood: Secondary | ICD-10-CM | POA: Diagnosis not present

## 2023-02-06 DIAGNOSIS — E559 Vitamin D deficiency, unspecified: Secondary | ICD-10-CM | POA: Diagnosis not present

## 2023-02-06 DIAGNOSIS — F17200 Nicotine dependence, unspecified, uncomplicated: Secondary | ICD-10-CM | POA: Diagnosis not present

## 2023-02-06 DIAGNOSIS — E669 Obesity, unspecified: Secondary | ICD-10-CM

## 2023-02-06 DIAGNOSIS — Z6838 Body mass index (BMI) 38.0-38.9, adult: Secondary | ICD-10-CM

## 2023-02-06 MED ORDER — VITAMIN D (ERGOCALCIFEROL) 1.25 MG (50000 UNIT) PO CAPS: 50000.0000 [IU] | ORAL_CAPSULE | ORAL | 0 refills | Status: DC

## 2023-02-06 MED ORDER — BUPROPION HCL ER (XL) 150 MG PO TB24
150.0000 mg | ORAL_TABLET | ORAL | 0 refills | Status: DC
Start: 2023-02-06 — End: 2023-02-27

## 2023-02-06 NOTE — Progress Notes (Signed)
[name], D.O.  ABFM, ABOM Specializing in Clinical Bariatric Medicine  Office located at: [address] [place], [place]  [address]     Assessment and Plan:   Medications Discontinued During This Encounter  Medication Reason   buPROPion ER (WELLBUTRIN SR) 100 MG 12 hr tablet    Vitamin D, Ergocalciferol, (DRISDOL) 1.25 MG (50000 UNIT) CAPS capsule Reorder     Meds ordered this encounter  Medications   Vitamin D, Ergocalciferol, (DRISDOL) 1.25 MG (50000 UNIT) CAPS capsule    Sig: Take 1 capsule (50,000 Units total) by mouth every 7 (seven) days.    Dispense:  4 capsule    Refill:  0   buPROPion (WELLBUTRIN XL) 150 MG 24 hr tablet    Sig: Take 1 tablet (150 mg total) by mouth every morning.    Dispense:  30 tablet    Refill:  0     Next OV: Check fasting blood work    Adjustment disorder with depressed mood- care giver stress + emotional eating Tobacco use disorder Assessment: Denies any SI/HI. Mood is stable. Pt endorses that since last OV, she has cut her smoking to half and reports smoking 5 cigarettes daily. Endorses taking her Wellbutrin once daily- she notes that it's difficult to remember to take it twice daily.   Plan: Switch pt to Wellbutrin XL 150 mg 24 hr tablet. Pt also agrees to decrease her tobacco use to 4 cigarettes daily.  Reminded patient of the importance of following their prudent nutrition plan and how food can affect mood as well to support emotional wellbeing. We will continue to monitor this condition as it relates to her weight loss journey.    Essential hypertension Assessment: Her blood pressure is elevated today - pt endorses being stressed this morning because she was running late to her appointment here. She is completely asymptomatic. Denies any chest pain, shortness of breath, and palpitations. Discussed with pt that her BP goal is <130/80. Her HTN is being treated with Norvasc 10 mg daily and Zestoretic 20-12.5 mg two tablets  daily- tolerating both medications well and denies any adverse affects.  Last 3 blood pressure readings in our office are as follows: BP Readings from Last 3 Encounters:  02/06/23 (!) 147/83  01/20/23 116/81  01/16/23 134/86   Plan: Pt advised to maintain with both antihypertensive medications as directed by PCP. I encouraged Meeka to check her blood pressure 2-3 times a wk and bring her blood pressure log to next OV. We will continue to monitor closely alongside PCP/ specialists.  Pt reminded to also f/up with those individuals as instructed by them. We will continue to monitor symptoms as they relate to the her weight loss journey.    Vitamin D deficiency Assessment: Condition is being treated with Ergocalciferol 50K IU weekly and she is responding to supplement well- denies any adverse effects.   Lab Results  Component Value Date   VD25OH 10.1 (L) 09/25/2022   Plan: Continue with meal plan, their weight loss efforts, and ERGO at current dose to further improve this condition. Will refill ERGO today.  Will continue to monitor levels regularly.    TREATMENT PLAN FOR OBESITY: BMI 38.0-38.9,adult-current bmi 38.63 Obesity with starting BMI of 39.0/date 09/25/22 Assessment: LARUA COLLIER is here to discuss her progress with her obesity treatment plan along with follow-up of her obesity related diagnoses. See Medical Weight Management Flowsheet for complete bioelectrical impedance results.  Condition is improving. Biometric data collected  today, was reviewed with patient.   Since last office visit on 01/16/23 patient's  Muscle mass has increased by 2 lb. Fat mass has decreased by 2.4 lb. Total body water has decreased by 2.6 lb.  Counseling done on how various foods will affect these numbers and how to maximize success  Total lbs lost to date: -2 lbs  Total weight loss percentage to date: 0.91%   Plan: I again briefly reviewed the Category 2 meal plan with Allexis since she had some  confusion about certain aspects of it. The patient was given the opportunity to ask questions.  All questions answered to their satisfaction.  The patient agrees to continue Category 2 meal plan with breakfast and lunch options.  Behavioral Intervention Additional resources provided today: category 2 meal plan information, breakfast options, and lunch options Evidence-based interventions for health behavior change were utilized today including the discussion of self monitoring techniques, problem-solving barriers and SMART goal setting techniques.   Regarding patient's less desirable eating habits and patterns, we employed the technique of small changes.  Pt will specifically work on: increasing lean protein intake and continuing to decrease tobacco use for next visit.    Recommended Physical Activity Goals  Kennady has been advised to slowly work up to 150 minutes of moderate intensity aerobic activity a week and strengthening exercises 2-3 times per week for cardiovascular health, weight loss maintenance and preservation of muscle mass.   She has agreed to Continue current level of physical activity   FOLLOW UP: Return in 2-3 wks. She was informed of the importance of frequent follow up visits to maximize her success with intensive lifestyle modifications for her multiple health conditions.  Subjective:   Chief complaint: Obesity Melia is here to discuss her progress with her obesity treatment plan. She is on  the Category 2 Plan with breakfast and lunch options and states she is following her eating plan approximately 95% of the time. She states she is doing leg exercises/walking 15 minutes 3 days per week.  Interval History:  HALLIE ERTL is here for a follow up office visit. Since last OV, Carren endorses sometimes skipping meals and not consuming all the protein on her plan. For snacks, she typically eats a cup of watermelons. Her hunger and cravings are better controlled when following  her prudent nutritional plan.She has no complaints with the meal plan.   Pharmacotherapy for weight loss: She is currently taking Wellbutrin for medical weight loss.  Denies side effects.    Review of Systems:  Pertinent positives were addressed with patient today.  Reviewed by clinician on day of visit: allergies, medications, problem list, medical history, surgical history, family history, social history, and previous encounter notes.  Weight Summary and Biometrics   Weight Lost Since Last Visit: 0lb  Weight Gained Since Last Visit: 0lb    Vitals Temp: (!) 97.5 F (36.4 C) BP: (!) 147/83 Pulse Rate: 67 SpO2: 98 %   Anthropometric Measurements Height: 5\' 3"  (1.6 m) Weight: 218 lb (98.9 kg) BMI (Calculated): 38.63 Weight at Last Visit: 218lb Weight Lost Since Last Visit: 0lb Weight Gained Since Last Visit: 0lb Starting Weight: 220lb Total Weight Loss (lbs): 2 lb (0.907 kg) Peak Weight: 220lb   Body Composition  Body Fat %: 42.8 % Fat Mass (lbs): 93.4 lbs Muscle Mass (lbs): 118.6 lbs Total Body Water (lbs): 75.8 lbs Visceral Fat Rating : 13   Other Clinical Data Fasting: no Labs: no Today's Visit #: 8 Starting Date: 09/25/22  Objective:   PHYSICAL EXAM: Blood pressure (!) 147/83, pulse 67, temperature (!) 97.5 F (36.4 C), height 5\' 3"  (1.6 m), weight 218 lb (98.9 kg), last menstrual period 02/11/2016, SpO2 98%. Body mass index is 38.62 kg/m.  General: Well Developed, well nourished, and in no acute distress.  HEENT: Normocephalic, atraumatic Skin: Warm and dry, cap RF less 2 sec, good turgor Chest:  Normal excursion, shape, no gross abn Respiratory: speaking in full sentences, no conversational dyspnea NeuroM-Sk: Ambulates w/o assistance, moves * 4 Psych: A and O *3, insight good, mood-full  DIAGNOSTIC DATA REVIEWED:  BMET    Component Value Date/Time   NA 140 09/25/2022 1031   K 4.7 09/25/2022 1031   CL 98 09/25/2022 1031   CO2 19 (L)  09/25/2022 1031   GLUCOSE 72 09/25/2022 1031   GLUCOSE 116 (H) 04/28/2020 1358   BUN 7 09/25/2022 1031   CREATININE 0.80 09/25/2022 1031   CALCIUM 9.6 09/25/2022 1031   GFRNONAA >60 04/28/2020 1358   GFRAA 90 05/10/2019 0855   Lab Results  Component Value Date   HGBA1C 6.0 (H) 09/25/2022   HGBA1C 5.2 03/31/2018   Lab Results  Component Value Date   INSULIN 18.6 09/25/2022   Lab Results  Component Value Date   TSH 0.671 09/25/2022   CBC    Component Value Date/Time   WBC 9.6 09/25/2022 1031   WBC 8.6 04/28/2020 1358   RBC 5.50 (H) 09/25/2022 1031   RBC 4.79 04/28/2020 1358   HGB 16.4 (H) 09/25/2022 1031   HCT 50.9 (H) 09/25/2022 1031   PLT 241 09/25/2022 1031   MCV 93 09/25/2022 1031   MCH 29.8 09/25/2022 1031   MCH 31.7 04/28/2020 1358   MCHC 32.2 09/25/2022 1031   MCHC 33.8 04/28/2020 1358   RDW 13.2 09/25/2022 1031   Iron Studies No results found for: "IRON", "TIBC", "FERRITIN", "IRONPCTSAT" Lipid Panel     Component Value Date/Time   CHOL 180 09/25/2022 1031   TRIG 85 09/25/2022 1031   HDL 55 09/25/2022 1031   CHOLHDL 3.2 03/01/2022 1113   CHOLHDL 3.3 Ratio 02/24/2009 2137   VLDL 29 02/24/2009 2137   LDLCALC 109 (H) 09/25/2022 1031   Hepatic Function Panel     Component Value Date/Time   PROT 8.1 09/25/2022 1031   ALBUMIN 4.2 09/25/2022 1031   AST 18 09/25/2022 1031   ALT 15 09/25/2022 1031   ALKPHOS 67 09/25/2022 1031   BILITOT 0.4 09/25/2022 1031      Component Value Date/Time   TSH 0.671 09/25/2022 1031   Nutritional Lab Results  Component Value Date   VD25OH 10.1 (L) 09/25/2022    Attestations:   I, Special Puri , acting as a Stage manager for Thomasene Lot, DO., have compiled all relevant documentation for today's office visit on behalf of Thomasene Lot, DO, while in the presence of [employer], DO.  I have reviewed the above documentation for accuracy and completeness, and I agree with the above. [name],  D.O.  The 21st Century Cures Act was signed into law in 2016 which includes the topic of electronic health records.  This provides immediate access to information in MyChart.  This includes consultation notes, operative notes, office notes, lab results and pathology reports.  If you have any questions about what you read please let us know at your next visit so we can discuss your concerns and take corrective action if need be.  We are right here with you.

## 2023-02-27 ENCOUNTER — Encounter (INDEPENDENT_AMBULATORY_CARE_PROVIDER_SITE_OTHER): Payer: Self-pay | Admitting: Family Medicine

## 2023-02-27 ENCOUNTER — Ambulatory Visit (INDEPENDENT_AMBULATORY_CARE_PROVIDER_SITE_OTHER): Payer: BC Managed Care – PPO | Admitting: Family Medicine

## 2023-02-27 VITALS — BP 118/84 | HR 78 | Temp 98.1°F | Ht 63.0 in | Wt 217.0 lb

## 2023-02-27 DIAGNOSIS — E559 Vitamin D deficiency, unspecified: Secondary | ICD-10-CM

## 2023-02-27 DIAGNOSIS — I10 Essential (primary) hypertension: Secondary | ICD-10-CM

## 2023-02-27 DIAGNOSIS — F4321 Adjustment disorder with depressed mood: Secondary | ICD-10-CM

## 2023-02-27 DIAGNOSIS — Z6838 Body mass index (BMI) 38.0-38.9, adult: Secondary | ICD-10-CM

## 2023-02-27 DIAGNOSIS — E669 Obesity, unspecified: Secondary | ICD-10-CM

## 2023-02-27 DIAGNOSIS — F17200 Nicotine dependence, unspecified, uncomplicated: Secondary | ICD-10-CM

## 2023-02-27 MED ORDER — VITAMIN D (ERGOCALCIFEROL) 1.25 MG (50000 UNIT) PO CAPS: 50000.0000 [IU] | ORAL_CAPSULE | ORAL | 0 refills | Status: DC

## 2023-02-27 MED ORDER — BUPROPION HCL ER (XL) 150 MG PO TB24
150.0000 mg | ORAL_TABLET | ORAL | 0 refills | Status: DC
Start: 2023-02-27 — End: 2023-07-08

## 2023-02-27 NOTE — Progress Notes (Signed)
[name], D.O.  ABFM, ABOM Specializing in Clinical Bariatric Medicine  Office located at: [address] [place], [place]  [address]     Assessment and Plan:  Fasting Labs next OV Medications Discontinued During This Encounter  Medication Reason   Vitamin D, Ergocalciferol, (DRISDOL) 1.25 MG (50000 UNIT) CAPS capsule Reorder   buPROPion (WELLBUTRIN XL) 150 MG 24 hr tablet Reorder    Meds ordered this encounter  Medications   Vitamin D, Ergocalciferol, (DRISDOL) 1.25 MG (50000 UNIT) CAPS capsule    Sig: Take 1 capsule (50,000 Units total) by mouth every 7 (seven) days.    Dispense:  8 capsule    Refill:  0   buPROPion (WELLBUTRIN XL) 150 MG 24 hr tablet    Sig: Take 1 tablet (150 mg total) by mouth every morning.    Dispense:  60 tablet    Refill:  0    Vitamin D deficiency Assessment: Condition is Not at goal. Her vitamin D level is very low. Pt has been compliant with taking Ergocalciferol weekly and is tolerating this well without significant difficulty.  Lab Results  Component Value Date   VD25OH 10.1 (L) 09/25/2022   Plan: - Continue Ergocalciferol 50K IU weekly. I will refill today.    - weight loss will likely improve availability of vitamin D, thus encouraged Carolyn Espinoza to continue with meal plan and their weight loss efforts to further improve this condition.  Thus, we will need to monitor levels regularly (every 3-4 mo on average) to keep levels within normal limits and prevent over supplementation.   Essential hypertension Assessment: Condition is Controlled.Marland Kitchen Pt BP is being well controlled with Norvasc and Zestoretic daily. She is tolerating these well without complications and denies any adverse side effects.  Last 3 blood pressure readings in our office are as follows: BP Readings from Last 3 Encounters:  02/27/23 118/84  02/06/23 (!) 147/83  01/20/23 116/81   Plan: - Continue with Norvasc 10 mg daily and Zestoretic 20-12.5 mg two tablets daily.  She denies need for refill.   - BP is 118/84 at goal today.   Lifestyle changes such as following our low salt, heart healthy meal plan and engaging in a regular exercise program discussed   - Ambulatory blood pressure monitoring encouraged.  Reminded patient that if they ever feel poorly in any way, to check their blood pressure and pulse as well.  - We will continue to monitor symptoms  closely alongside PCP/ specialists.    Adjustment disorder with depressed mood- care giver stress + emotional eating Tobacco use disorder Assessment: Condition is Improving, but not optimized.. Denies any SI/HI. Mood is stable. Pt is inconsistent with taking Wellbutrin but notices that when taking this it decreases her need for wanting to smoke. She is now down to 5 cigarettes a day and plans to cut down on her intake daily.   Plan: - Continue Wellbutrin XL 150mg  daily. I will refill today.   - Continue to decrease her cigarette intake and overall quit.   - We will continue to monitor closely alongside PCP with her tobacco use.    TREATMENT PLAN FOR OBESITY: BMI 38.0-38.9,adult-current bmi 38.63 Obesity with starting BMI of 39.0/date 09/25/22 Assessment:  Carolyn Espinoza is here to discuss her progress with her obesity treatment plan along with follow-up of her obesity related diagnoses. See Medical Weight Management Flowsheet for complete bioelectrical impedance results.  Condition is not optimized. Biometric data collected today, was reviewed  with patient.   Since last office visit on 02/06/2023 patient's  Muscle mass has decreased by 28lb. Fat mass has increased by 1.6lb. Total body water has decreased by 1.6lb.  Counseling done on how various foods will affect these numbers and how to maximize success  Total lbs lost to date: 3 Total weight loss percentage to date: 1.36%  Plan: - Follow Cateogry 2 with B and L options and begin journaling her food intake (food, calories, grams/protein)  -  return for fasting labs as she was not fasting today.   Behavioral Intervention Additional resources provided today:  Provided hand out on proper plate portions, healthy complex carbs and sources of protein as well as generaly healthy eating concepts.  Evidence-based interventions for health behavior change were utilized today including the discussion of self monitoring techniques, problem-solving barriers and SMART goal setting techniques.   Regarding patient's less desirable eating habits and patterns, we employed the technique of small changes.  Pt will specifically work on: Comptroller her food intake and quit smoking by Sept.1  for next visit.    Recommended Physical Activity Goals  Carolyn Espinoza has been advised to slowly work up to 150 minutes of moderate intensity aerobic activity a week and strengthening exercises 2-3 times per week for cardiovascular health, weight loss maintenance and preservation of muscle mass.   She has agreed to Continue current level of physical activity    FOLLOW UP: Return in about 7 weeks (around 04/14/2023).  She was informed of the importance of frequent follow up visits to maximize her success with intensive lifestyle modifications for her multiple health conditions. Carolyn Espinoza is aware that we will review all of her lab results at our next visit together in person.  She is aware that if anything is critical/ life threatening with the results, we will be contacting her via MyChart or by my CMA will be calling them prior to the office visit to discuss acute management.    Subjective:   Chief complaint: Obesity Carolyn Espinoza is here to discuss her progress with her obesity treatment plan. She is on the the Category 2 Plan  with B and L options and states she is following her eating plan approximately 40% of the time. She states she is resistance training 15-20 minutes 3 days per week.  Interval History:  Carolyn Espinoza is here for a follow up office visit. Since  last OV she has been doing well. Pt informed me that it is hard to eat breakfast as she doesn't wake up early enough in the morning. She continues to try and eat lean meat. She feels as she struggles with eating certain things on the meal plan.    Pharmacotherapy for weight loss: She is currently taking Wellbutrin for medical weight loss.  Denies side effects.    Review of Systems:  Pertinent positives were addressed with patient today.   Reviewed by clinician on day of visit: allergies, medications, problem list, medical history, surgical history, family history, social history, and previous encounter notes.  Weight Summary and Biometrics   Weight Lost Since Last Visit: 1lb  Weight Gained Since Last Visit: 0lb   Vitals Temp: 98.1 F (36.7 C) BP: 118/84 Pulse Rate: 78 SpO2: 96 %   Anthropometric Measurements Height: 5\' 3"  (1.6 m) Weight: 217 lb (98.4 kg) BMI (Calculated): 38.45 Weight at Last Visit: 218lb Weight Lost Since Last Visit: 1lb Weight Gained Since Last Visit: 0lb Starting Weight: 220lb Total Weight Loss (lbs): 3 lb (  1.361 kg) Peak Weight: 220lb   Body Composition  Body Fat %: 43.8 % Fat Mass (lbs): 95 lbs Muscle Mass (lbs): 115.8 lbs Total Body Water (lbs): 74.2 lbs Visceral Fat Rating : 13   Other Clinical Data Fasting: no Labs: yes Today's Visit #: 9 Starting Date: 09/25/22     Objective:   PHYSICAL EXAM: Blood pressure 118/84, pulse 78, temperature 98.1 F (36.7 C), height 5\' 3"  (1.6 m), weight 217 lb (98.4 kg), last menstrual period 02/11/2016, SpO2 96%. Body mass index is 38.44 kg/m.  General: Well Developed, well nourished, and in no acute distress.  HEENT: Normocephalic, atraumatic Skin: Warm and dry, cap RF less 2 sec, good turgor Chest:  Normal excursion, shape, no gross abn Respiratory: speaking in full sentences, no conversational dyspnea NeuroM-Sk: Ambulates w/o assistance, moves * 4 Psych: A and O *3, insight good,  mood-full  DIAGNOSTIC DATA REVIEWED:  BMET    Component Value Date/Time   NA 140 09/25/2022 1031   K 4.7 09/25/2022 1031   CL 98 09/25/2022 1031   CO2 19 (L) 09/25/2022 1031   GLUCOSE 72 09/25/2022 1031   GLUCOSE 116 (H) 04/28/2020 1358   BUN 7 09/25/2022 1031   CREATININE 0.80 09/25/2022 1031   CALCIUM 9.6 09/25/2022 1031   GFRNONAA >60 04/28/2020 1358   GFRAA 90 05/10/2019 0855   Lab Results  Component Value Date   HGBA1C 6.0 (H) 09/25/2022   HGBA1C 5.2 03/31/2018   Lab Results  Component Value Date   INSULIN 18.6 09/25/2022   Lab Results  Component Value Date   TSH 0.671 09/25/2022   CBC    Component Value Date/Time   WBC 9.6 09/25/2022 1031   WBC 8.6 04/28/2020 1358   RBC 5.50 (H) 09/25/2022 1031   RBC 4.79 04/28/2020 1358   HGB 16.4 (H) 09/25/2022 1031   HCT 50.9 (H) 09/25/2022 1031   PLT 241 09/25/2022 1031   MCV 93 09/25/2022 1031   MCH 29.8 09/25/2022 1031   MCH 31.7 04/28/2020 1358   MCHC 32.2 09/25/2022 1031   MCHC 33.8 04/28/2020 1358   RDW 13.2 09/25/2022 1031   Iron Studies No results found for: "IRON", "TIBC", "FERRITIN", "IRONPCTSAT" Lipid Panel     Component Value Date/Time   CHOL 180 09/25/2022 1031   TRIG 85 09/25/2022 1031   HDL 55 09/25/2022 1031   CHOLHDL 3.2 03/01/2022 1113   CHOLHDL 3.3 Ratio 02/24/2009 2137   VLDL 29 02/24/2009 2137   LDLCALC 109 (H) 09/25/2022 1031   Hepatic Function Panel     Component Value Date/Time   PROT 8.1 09/25/2022 1031   ALBUMIN 4.2 09/25/2022 1031   AST 18 09/25/2022 1031   ALT 15 09/25/2022 1031   ALKPHOS 67 09/25/2022 1031   BILITOT 0.4 09/25/2022 1031      Component Value Date/Time   TSH 0.671 09/25/2022 1031   Nutritional Lab Results  Component Value Date   VD25OH 10.1 (L) 09/25/2022    Attestations:   I, Clinical biochemist, acting as a Stage manager for [employer], DO., have compiled all relevant documentation for today's office visit on behalf of Thomasene Lot, DO,  while in the presence of [employer], DO.  I have reviewed the above documentation for accuracy and completeness, and I agree with the above. [name], D.O.  The 21st Century Cures Act was signed into law in 2016 which includes the topic of electronic health records.  This provides immediate access to information in MyChart.  This includes consultation notes, operative notes, office notes, lab results and pathology reports.  If you have any questions about what you read please let us know at your next visit so we can discuss your concerns and take corrective action if need be.  We are right here with you.

## 2023-03-31 ENCOUNTER — Ambulatory Visit (INDEPENDENT_AMBULATORY_CARE_PROVIDER_SITE_OTHER): Payer: BC Managed Care – PPO | Admitting: Family Medicine

## 2023-04-22 ENCOUNTER — Encounter: Payer: BC Managed Care – PPO | Admitting: Obstetrics and Gynecology

## 2023-04-22 ENCOUNTER — Ambulatory Visit (INDEPENDENT_AMBULATORY_CARE_PROVIDER_SITE_OTHER): Payer: BC Managed Care – PPO | Admitting: Primary Care

## 2023-05-19 ENCOUNTER — Ambulatory Visit (INDEPENDENT_AMBULATORY_CARE_PROVIDER_SITE_OTHER): Payer: BC Managed Care – PPO | Admitting: Family Medicine

## 2023-05-19 NOTE — Progress Notes (Incomplete)
[name], D.O.  ABFM, ABOM Specializing in Clinical Bariatric Medicine  Office located at: [address] [place], [place]  [address]     Assessment and Plan:   No orders of the defined types were placed in this encounter.   There are no discontinued medications.   No orders of the defined types were placed in this encounter.    *** There are no diagnoses linked to this encounter.     TREATMENT PLAN FOR OBESITY:  Assessment:  Carolyn Espinoza is here to discuss her progress with her obesity treatment plan along with follow-up of her obesity related diagnoses. See Medical Weight Management Flowsheet for complete bioelectrical impedance results.  Condition is {docourse:29403:::1}. Biometric data collected today, was reviewed with patient.   Since last office visit on 02/27/2023 patient's  Muscle mass has {DID:29233} by ***lb. Fat mass has {DID:29233} by ***lb. Total body water has {DID:29233} by ***lb.  Counseling done on how various foods will affect these numbers and how to maximize success  Total lbs lost to date: *** Total weight loss percentage to date: ***  Plan:  Carolyn Espinoza is currently in the action stage of change. As such, her goal is to continue her weight management plan. Carolyn Espinoza will work on healthier eating habits and try to follow the {mealplan:29239} best they can.   Behavioral Intervention Additional resources provided today: {weightresources:29185} Evidence-based interventions for health behavior change were utilized today including the discussion of self monitoring techniques, problem-solving barriers and SMART goal setting techniques.   Regarding patient's less desirable eating habits and patterns, we employed the technique of small changes.  Pt will specifically work on: *** for next visit.     She has agreed to {EMEXERCISE:28847::"Think about enjoyable ways to increase daily physical activity and overcoming barriers to exercise","Increase physical  activity in their day and reduce sedentary time (increase NEAT)."}   FOLLOW UP: No follow-ups on file.  She was informed of the importance of frequent follow up visits to maximize her success with intensive lifestyle modifications for her multiple health conditions.  Subjective:   Chief complaint: Obesity Carolyn Espinoza is here to discuss her progress with her obesity treatment plan. She is on the {MWMwtlossportion/plan2:23431} and states she is following her eating plan approximately ***% of the time. She states she is exercising *** minutes *** days per week.  Interval History:  Carolyn Espinoza is here for a follow up office visit.     Since last office visit:  ***    We reviewed her meal plan and all questions were answered.    Pharmacotherapy for weight loss: She is currently taking Wellbutrin for medical weight loss.  Denies side effects.    Review of Systems:  Pertinent positives were addressed with patient today.  Reviewed by clinician on day of visit: allergies, medications, problem list, medical history, surgical history, family history, social history, and previous encounter notes.  Weight Summary and Biometrics   No data recorded No data recorded  No data recorded No data recorded No data recorded No data recorded   Objective:   PHYSICAL EXAM: Last menstrual period 02/11/2016. There is no height or weight on file to calculate BMI.  General: Well Developed, well nourished, and in no acute distress.  HEENT: Normocephalic, atraumatic Skin: Warm and dry, cap RF less 2 sec, good turgor Chest:  Normal excursion, shape, no gross abn Respiratory: speaking in full sentences, no conversational dyspnea NeuroM-Sk: Ambulates w/o assistance, moves * 4 Psych: A and  O *3, insight good, mood-full  DIAGNOSTIC DATA REVIEWED:  BMET    Component Value Date/Time   NA 140 09/25/2022 1031   K 4.7 09/25/2022 1031   CL 98 09/25/2022 1031   CO2 19 (L) 09/25/2022 1031   GLUCOSE 72  09/25/2022 1031   GLUCOSE 116 (H) 04/28/2020 1358   BUN 7 09/25/2022 1031   CREATININE 0.80 09/25/2022 1031   CALCIUM 9.6 09/25/2022 1031   GFRNONAA >60 04/28/2020 1358   GFRAA 90 05/10/2019 0855   Lab Results  Component Value Date   HGBA1C 6.0 (H) 09/25/2022   HGBA1C 5.2 03/31/2018   Lab Results  Component Value Date   INSULIN 18.6 09/25/2022   Lab Results  Component Value Date   TSH 0.671 09/25/2022   CBC    Component Value Date/Time   WBC 9.6 09/25/2022 1031   WBC 8.6 04/28/2020 1358   RBC 5.50 (H) 09/25/2022 1031   RBC 4.79 04/28/2020 1358   HGB 16.4 (H) 09/25/2022 1031   HCT 50.9 (H) 09/25/2022 1031   PLT 241 09/25/2022 1031   MCV 93 09/25/2022 1031   MCH 29.8 09/25/2022 1031   MCH 31.7 04/28/2020 1358   MCHC 32.2 09/25/2022 1031   MCHC 33.8 04/28/2020 1358   RDW 13.2 09/25/2022 1031   Iron Studies No results found for: "IRON", "TIBC", "FERRITIN", "IRONPCTSAT" Lipid Panel     Component Value Date/Time   CHOL 180 09/25/2022 1031   TRIG 85 09/25/2022 1031   HDL 55 09/25/2022 1031   CHOLHDL 3.2 03/01/2022 1113   CHOLHDL 3.3 Ratio 02/24/2009 2137   VLDL 29 02/24/2009 2137   LDLCALC 109 (H) 09/25/2022 1031   Hepatic Function Panel     Component Value Date/Time   PROT 8.1 09/25/2022 1031   ALBUMIN 4.2 09/25/2022 1031   AST 18 09/25/2022 1031   ALT 15 09/25/2022 1031   ALKPHOS 67 09/25/2022 1031   BILITOT 0.4 09/25/2022 1031      Component Value Date/Time   TSH 0.671 09/25/2022 1031   Nutritional Lab Results  Component Value Date   VD25OH 10.1 (L) 09/25/2022    Attestations:   This encounter took 40 total minutes of time including any pre-visit and post-visit time spent on this date of service, including taking a thorough history, reviewing any labs and/or imaging, reviewing prior notes, as well as documenting in the electronic health record on the date of service. Over 50% of that time was in direct face-to-face counseling and coordinating care  for the patient today  I, Clinical biochemist, acting as a Stage manager for [employer], DO., have compiled all relevant documentation for today's office visit on behalf of Thomasene Lot, DO, while in the presence of [employer], DO.  I have reviewed the above documentation for accuracy and completeness, and I agree with the above. [name], D.O.  The 21st Century Cures Act was signed into law in 2016 which includes the topic of electronic health records.  This provides immediate access to information in MyChart.  This includes consultation notes, operative notes, office notes, lab results and pathology reports.  If you have any questions about what you read please let us know at your next visit so we can discuss your concerns and take corrective action if need be.  We are right here with you.

## 2023-05-30 ENCOUNTER — Ambulatory Visit (INDEPENDENT_AMBULATORY_CARE_PROVIDER_SITE_OTHER): Payer: BC Managed Care – PPO | Admitting: Primary Care

## 2023-06-09 ENCOUNTER — Telehealth (INDEPENDENT_AMBULATORY_CARE_PROVIDER_SITE_OTHER): Payer: Self-pay | Admitting: Primary Care

## 2023-06-09 NOTE — Telephone Encounter (Signed)
Please reach out to pt to schedule a virtual appt for incontinence supplies. Once pt has appt and provider signs note will fax note to [employer]

## 2023-06-09 NOTE — Telephone Encounter (Signed)
Patria calling from [employer] is calling to request clinical documentation request on 06/01/23 for incontinence supplies. CB- [phone] Fax- [phone]

## 2023-06-09 NOTE — Telephone Encounter (Signed)
Called and spoke to pt and got her apt confirmed.

## 2023-06-13 ENCOUNTER — Telehealth (INDEPENDENT_AMBULATORY_CARE_PROVIDER_SITE_OTHER): Payer: Self-pay | Admitting: Primary Care
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
## 2023-06-16 ENCOUNTER — Encounter (INDEPENDENT_AMBULATORY_CARE_PROVIDER_SITE_OTHER): Payer: BC Managed Care – PPO | Admitting: Primary Care

## 2023-06-16 ENCOUNTER — Encounter (INDEPENDENT_AMBULATORY_CARE_PROVIDER_SITE_OTHER): Payer: Self-pay

## 2023-06-16 NOTE — Progress Notes (Signed)
  [facility] Telephone Note  Unable to connected with Carolyn Espinoza, on 06/16/2023 at 3:59 PM through telephone , left voice mail

## 2023-07-02 ENCOUNTER — Other Ambulatory Visit (INDEPENDENT_AMBULATORY_CARE_PROVIDER_SITE_OTHER): Payer: Self-pay | Admitting: Urgent Care

## 2023-07-08 ENCOUNTER — Ambulatory Visit (INDEPENDENT_AMBULATORY_CARE_PROVIDER_SITE_OTHER): Payer: BC Managed Care – PPO | Admitting: Primary Care

## 2023-07-08 ENCOUNTER — Encounter (INDEPENDENT_AMBULATORY_CARE_PROVIDER_SITE_OTHER): Payer: Self-pay | Admitting: Primary Care

## 2023-07-08 VITALS — BP 129/95 | HR 93 | Resp 16 | Wt 222.0 lb

## 2023-07-08 DIAGNOSIS — F4321 Adjustment disorder with depressed mood: Secondary | ICD-10-CM

## 2023-07-08 DIAGNOSIS — Z760 Encounter for issue of repeat prescription: Secondary | ICD-10-CM

## 2023-07-08 DIAGNOSIS — R062 Wheezing: Secondary | ICD-10-CM | POA: Diagnosis not present

## 2023-07-08 DIAGNOSIS — F17200 Nicotine dependence, unspecified, uncomplicated: Secondary | ICD-10-CM

## 2023-07-08 DIAGNOSIS — Z1211 Encounter for screening for malignant neoplasm of colon: Secondary | ICD-10-CM | POA: Diagnosis not present

## 2023-07-08 DIAGNOSIS — I10 Essential (primary) hypertension: Secondary | ICD-10-CM | POA: Diagnosis not present

## 2023-07-08 DIAGNOSIS — Z6838 Body mass index (BMI) 38.0-38.9, adult: Secondary | ICD-10-CM

## 2023-07-08 DIAGNOSIS — N3946 Mixed incontinence: Secondary | ICD-10-CM

## 2023-07-08 MED ORDER — ALBUTEROL SULFATE HFA 108 (90 BASE) MCG/ACT IN AERS
2.0000 | INHALATION_SPRAY | RESPIRATORY_TRACT | 1 refills | Status: AC | PRN
Start: 2023-07-08 — End: ?

## 2023-07-08 MED ORDER — LISINOPRIL-HYDROCHLOROTHIAZIDE 20-12.5 MG PO TABS
2.0000 | ORAL_TABLET | Freq: Every day | ORAL | 1 refills | Status: DC
Start: 2023-07-08 — End: 2024-05-25

## 2023-07-08 MED ORDER — BUPROPION HCL ER (XL) 150 MG PO TB24
150.0000 mg | ORAL_TABLET | ORAL | 0 refills | Status: DC
Start: 2023-07-08 — End: 2024-05-04

## 2023-07-08 MED ORDER — AMLODIPINE BESYLATE 10 MG PO TABS
ORAL_TABLET | ORAL | 1 refills | Status: DC
Start: 2023-07-08 — End: 2024-05-24

## 2023-07-08 NOTE — Patient Instructions (Signed)
Calorie Counting for Weight Loss Calories are units of energy. Your body needs a certain number of calories from food to keep going throughout the day. When you eat or drink more calories than your body needs, your body stores the extra calories mostly as fat. When you eat or drink fewer calories than your body needs, your body burns fat to get the energy it needs. Calorie counting means keeping track of how many calories you eat and drink each day. Calorie counting can be helpful if you need to lose weight. If you eat fewer calories than your body needs, you should lose weight. Ask your health care provider what a healthy weight is for you. For calorie counting to work, you will need to eat the right number of calories each day to lose a healthy amount of weight per week. A dietitian can help you figure out how many calories you need in a day and will suggest ways to reach your calorie goal. A healthy amount of weight to lose each week is usually 1-2 lb (0.5-0.9 kg). This usually means that your daily calorie intake should be reduced by 500-750 calories. Eating 1,200-1,500 calories a day can help most women lose weight. Eating 1,500-1,800 calories a day can help most men lose weight. What do I need to know about calorie counting? Work with your health care provider or dietitian to determine how many calories you should get each day. To meet your daily calorie goal, you will need to: Find out how many calories are in each food that you would like to eat. Try to do this before you eat. Decide how much of the food you plan to eat. Keep a food log. Do this by writing down what you ate and how many calories it had. To successfully lose weight, it is important to balance calorie counting with a healthy lifestyle that includes regular activity. Where do I find calorie information?  The number of calories in a food can be found on a Nutrition Facts label. If a food does not have a Nutrition Facts label, try  to look up the calories online or ask your dietitian for help. Remember that calories are listed per serving. If you choose to have more than one serving of a food, you will have to multiply the calories per serving by the number of servings you plan to eat. For example, the label on a package of bread might say that a serving size is 1 slice and that there are 90 calories in a serving. If you eat 1 slice, you will have eaten 90 calories. If you eat 2 slices, you will have eaten 180 calories. How do I keep a food log? After each time that you eat, record the following in your food log as soon as possible: What you ate. Be sure to include toppings, sauces, and other extras on the food. How much you ate. This can be measured in cups, ounces, or number of items. How many calories were in each food and drink. The total number of calories in the food you ate. Keep your food log near you, such as in a pocket-sized notebook or on an app or website on your mobile phone. Some programs will calculate calories for you and show you how many calories you have left to meet your daily goal. What are some portion-control tips? Know how many calories are in a serving. This will help you know how many servings you can have of a certain   food. Use a measuring cup to measure serving sizes. You could also try weighing out portions on a kitchen scale. With time, you will be able to estimate serving sizes for some foods. Take time to put servings of different foods on your favorite plates or in your favorite bowls and cups so you know what a serving looks like. Try not to eat straight from a food's packaging, such as from a bag or box. Eating straight from the package makes it hard to see how much you are eating and can lead to overeating. Put the amount you would like to eat in a cup or on a plate to make sure you are eating the right portion. Use smaller plates, glasses, and bowls for smaller portions and to prevent  overeating. Try not to multitask. For example, avoid watching TV or using your computer while eating. If it is time to eat, sit down at a table and enjoy your food. This will help you recognize when you are full. It will also help you be more mindful of what and how much you are eating. What are tips for following this plan? Reading food labels Check the calorie count compared with the serving size. The serving size may be smaller than what you are used to eating. Check the source of the calories. Try to choose foods that are high in protein, fiber, and vitamins, and low in saturated fat, trans fat, and sodium. Shopping Read nutrition labels while you shop. This will help you make healthy decisions about which foods to buy. Pay attention to nutrition labels for low-fat or fat-free foods. These foods sometimes have the same number of calories or more calories than the full-fat versions. They also often have added sugar, starch, or salt to make up for flavor that was removed with the fat. Make a grocery list of lower-calorie foods and stick to it. Cooking Try to cook your favorite foods in a healthier way. For example, try baking instead of frying. Use low-fat dairy products. Meal planning Use more fruits and vegetables. One-half of your plate should be fruits and vegetables. Include lean proteins, such as chicken, turkey, and fish. Lifestyle Each week, aim to do one of the following: 150 minutes of moderate exercise, such as walking. 75 minutes of vigorous exercise, such as running. General information Know how many calories are in the foods you eat most often. This will help you calculate calorie counts faster. Find a way of tracking calories that works for you. Get creative. Try different apps or programs if writing down calories does not work for you. What foods should I eat?  Eat nutritious foods. It is better to have a nutritious, high-calorie food, such as an avocado, than a food with  few nutrients, such as a bag of potato chips. Use your calories on foods and drinks that will fill you up and will not leave you hungry soon after eating. Examples of foods that fill you up are nuts and nut butters, vegetables, lean proteins, and high-fiber foods such as whole grains. High-fiber foods are foods with more than 5 g of fiber per serving. Pay attention to calories in drinks. Low-calorie drinks include water and unsweetened drinks. The items listed above may not be a complete list of foods and beverages you can eat. Contact a dietitian for more information. What foods should I limit? Limit foods or drinks that are not good sources of vitamins, minerals, or protein or that are high in unhealthy fats. These   include: Candy. Other sweets. Sodas, specialty coffee drinks, alcohol, and juice. The items listed above may not be a complete list of foods and beverages you should avoid. Contact a dietitian for more information. How do I count calories when eating out? Pay attention to portions. Often, portions are much larger when eating out. Try these tips to keep portions smaller: Consider sharing a meal instead of getting your own. If you get your own meal, eat only half of it. Before you start eating, ask for a container and put half of your meal into it. When available, consider ordering smaller portions from the menu instead of full portions. Pay attention to your food and drink choices. Knowing the way food is cooked and what is included with the meal can help you eat fewer calories. If calories are listed on the menu, choose the lower-calorie options. Choose dishes that include vegetables, fruits, whole grains, low-fat dairy products, and lean proteins. Choose items that are boiled, broiled, grilled, or steamed. Avoid items that are buttered, battered, fried, or served with cream sauce. Items labeled as crispy are usually fried, unless stated otherwise. Choose water, low-fat milk,  unsweetened iced tea, or other drinks without added sugar. If you want an alcoholic beverage, choose a lower-calorie option, such as a glass of wine or light beer. Ask for dressings, sauces, and syrups on the side. These are usually high in calories, so you should limit the amount you eat. If you want a salad, choose a garden salad and ask for grilled meats. Avoid extra toppings such as bacon, cheese, or fried items. Ask for the dressing on the side, or ask for olive oil and vinegar or lemon to use as dressing. Estimate how many servings of a food you are given. Knowing serving sizes will help you be aware of how much food you are eating at restaurants. Where to find more information Centers for Disease Control and Prevention: www.cdc.gov U.S. Department of Agriculture: myplate.gov Summary Calorie counting means keeping track of how many calories you eat and drink each day. If you eat fewer calories than your body needs, you should lose weight. A healthy amount of weight to lose per week is usually 1-2 lb (0.5-0.9 kg). This usually means reducing your daily calorie intake by 500-750 calories. The number of calories in a food can be found on a Nutrition Facts label. If a food does not have a Nutrition Facts label, try to look up the calories online or ask your dietitian for help. Use smaller plates, glasses, and bowls for smaller portions and to prevent overeating. Use your calories on foods and drinks that will fill you up and not leave you hungry shortly after a meal. This information is not intended to replace advice given to you by your health care provider. Make sure you discuss any questions you have with your health care provider. Document Revised: 08/19/2019 Document Reviewed: 08/19/2019 Elsevier Patient Education  2023 Elsevier Inc.  

## 2023-07-08 NOTE — Progress Notes (Signed)
[facility]  Carolyn Espinoza, is a 57 y.o. female  [id number]  [id number]  DOB - Nov 29, 1965  Chief Complaint  Patient presents with   Hypertension       Subjective:   Carolyn Espinoza is a 57 y.o. female here today for a follow up visit for hypertension. 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 The current episode started more than 1 year ago. The problem occurs every urination. The problem has been gradually worsening. The patient is experiencing no pain. There has been no fever. She is Not sexually active. There is No history of pyelonephritis. Associated symptoms include frequency and urgency. She has tried nothing (wears depends) for the symptoms. The treatment provided moderate relief.  Hypertension This is a chronic problem. The current episode started more than 1 year ago. The problem has been waxing and waning since onset. The problem is uncontrolled. Associated symptoms include headaches, palpitations and shortness of breath. (Palpitation seldom) Agents associated with hypertension include NSAIDs. Risk factors for coronary artery disease include smoking/tobacco exposure and obesity. Past treatments include calcium channel blockers, ACE inhibitors and diuretics. There are no compliance problems.     No problems updated.  Comprehensive ROS Pertinent positive and negative noted in HPI   No Known Allergies  Past Medical History:  Diagnosis Date   Asthma    Back pain    Constipation    HTN (hypertension) 07/22/2010    Current Outpatient Medications on File Prior to Visit  Medication Sig Dispense Refill   Multiple Vitamin (MULTIVITAMIN WITH MINERALS) TABS tablet Take 1 tablet by mouth daily.     triamcinolone cream (KENALOG) 0.1 % Apply 1 Application topically 2 (two)  times daily. 15 g 0   Vitamin D, Ergocalciferol, (DRISDOL) 1.25 MG (50000 UNIT) CAPS capsule Take 1 capsule (50,000 Units total) by mouth every 7 (seven) days. 8 capsule 0   No current facility-administered medications on file prior to visit.   Health Maintenance  Topic Date Due   Zoster (Shingles) Vaccine (2 of 2) 04/26/2022   Colon Cancer Screening  07/11/2022   Pap with HPV screening  02/28/2023   Mammogram  10/20/2024   DTaP/Tdap/Td vaccine (3 - Td or Tdap) 08/02/2029   Flu Shot  Completed   COVID-19 Vaccine  Completed   Hepatitis C Screening  Completed   HIV Screening  Completed   HPV Vaccine  Aged Out    Objective:   Vitals:   07/08/23 1355  BP: (!) 129/95  Pulse: 93  Resp: 16  SpO2: 95%  Weight: 222 lb (100.7 kg)   BP Readings from Last 3 Encounters:  07/08/23 (!) 129/95  02/27/23 118/84  02/06/23 (!) 147/83      Physical Exam Vitals reviewed.  HENT:     Head: Normocephalic.     Right Ear: Tympanic membrane, ear canal and external ear normal.     Left Ear: Tympanic membrane, ear canal and external ear normal.     Nose: Nose normal.     Mouth/Throat:     Mouth: Mucous membranes are moist.  Eyes:     Extraocular Movements: Extraocular movements intact.     Pupils: Pupils are equal, round, and reactive to light.  Cardiovascular:     Rate and Rhythm: Normal rate.  Pulmonary:     Effort:  Pulmonary effort is normal.     Breath sounds: Normal breath sounds.  Abdominal:     General: Bowel sounds are normal.     Palpations: Abdomen is soft.  Musculoskeletal:        General: Normal range of motion.     Cervical back: Normal range of motion.  Skin:    General: Skin is warm and dry.  Neurological:     Mental Status: She is alert and oriented to person, place, and time.  Psychiatric:        Mood and Affect: Mood normal.        Behavior: Behavior normal.        Thought Content: Thought content normal.      Assessment & Plan    [name] was seen today for  hypertension.  Diagnoses and all orders for this visit:  Colon cancer screening -     Ambulatory referral to Gastroenterology  Wheezing -     albuterol (VENTOLIN HFA) 108 (90 Base) MCG/ACT inhaler; Inhale 2 puffs into the lungs every 4 (four) hours as needed for wheezing or shortness of breath.  Hypertension, unspecified type Takes Bp meds at bedtime Explained that having normal blood pressure is the goal and medications are helping to get to goal and maintain normal blood pressure. DIET: Limit salt intake, read nutrition labels to check salt content, limit fried and high fatty foods  Avoid using multisymptom OTC cold preparations that generally contain sudafed which can rise BP. Consult with pharmacist on best cold relief products to use for persons with HTN EXERCISE Discussed incorporating exercise such as walking - 30 minutes most days of the week and can do in 10 minute intervals    -     amLODipine (NORVASC) 10 MG tablet; Take 1 tablet daily -     lisinopril-hydrochlorothiazide (ZESTORETIC) 20-12.5 MG tablet; Take 2 tablets by mouth daily.  Medication refill -     amLODipine (NORVASC) 10 MG tablet; Take 1 tablet daily -     buPROPion (WELLBUTRIN XL) 150 MG 24 hr tablet; Take 1 tablet (150 mg total) by mouth every morning. -     lisinopril-hydrochlorothiazide (ZESTORETIC) 20-12.5 MG tablet; Take 2 tablets by mouth daily.  Adjustment disorder with depressed mood- care giver stress + emotional eating -     buPROPion (WELLBUTRIN XL) 150 MG 24 hr tablet; Take 1 tablet (150 mg total) by mouth every morning.  Tobacco use disorder -     buPROPion (WELLBUTRIN XL) 150 MG 24 hr tablet; Take 1 tablet (150 mg total) by mouth every morning.   BMI 38.0-38.9,adult-current bmi 38.63  Obesity is 30-39 indicating an excess in caloric intake or underlining conditions. This may lead to other co-morbidities. Educated on lifestyle modifications of diet and exercise which may reduce obesity.     Patient have been counseled extensively about nutrition and exercise. Other issues discussed during this visit include: low cholesterol diet, weight control and daily exercise, foot care, annual eye examinations at Ophthalmology, importance of adherence with medications and regular follow-up. We also discussed long term complications of uncontrolled diabetes and hypertension.   Return in about 3 months (around 10/06/2023).  The patient was given clear instructions to go to ER or return to medical center if symptoms don't improve, worsen or new problems develop. The patient verbalized understanding. The patient was told to call to get lab results if they haven't heard anything in the next week.   This note has been created with [employer]  Airline [employer]. Any transcriptional errors are unintentional.   Grayce Sessions, NP 07/08/2023, 2:26 PM

## 2023-08-01 ENCOUNTER — Telehealth (INDEPENDENT_AMBULATORY_CARE_PROVIDER_SITE_OTHER): Payer: Self-pay | Admitting: Primary Care

## 2023-08-01 NOTE — Telephone Encounter (Signed)
 Will fax ov noted dated for 07/08/23 on Monday

## 2023-08-01 NOTE — Telephone Encounter (Signed)
 Angel from [employer] called to get someone to call her back about incontinent supplies for patient. Please f/u with Lawanna Kobus

## 2023-08-04 ENCOUNTER — Ambulatory Visit (INDEPENDENT_AMBULATORY_CARE_PROVIDER_SITE_OTHER): Payer: BC Managed Care – PPO | Admitting: Primary Care

## 2023-08-06 ENCOUNTER — Ambulatory Visit (INDEPENDENT_AMBULATORY_CARE_PROVIDER_SITE_OTHER): Payer: Medicaid Other | Admitting: Family Medicine

## 2023-08-06 ENCOUNTER — Encounter (INDEPENDENT_AMBULATORY_CARE_PROVIDER_SITE_OTHER): Payer: Self-pay | Admitting: Family Medicine

## 2023-08-06 VITALS — BP 132/80 | HR 87 | Temp 98.5°F | Ht 63.0 in | Wt 217.0 lb

## 2023-08-06 DIAGNOSIS — N3946 Mixed incontinence: Secondary | ICD-10-CM

## 2023-08-06 DIAGNOSIS — R7303 Prediabetes: Secondary | ICD-10-CM

## 2023-08-06 DIAGNOSIS — R0602 Shortness of breath: Secondary | ICD-10-CM

## 2023-08-06 DIAGNOSIS — E559 Vitamin D deficiency, unspecified: Secondary | ICD-10-CM

## 2023-08-06 DIAGNOSIS — Z6838 Body mass index (BMI) 38.0-38.9, adult: Secondary | ICD-10-CM | POA: Diagnosis not present

## 2023-08-06 DIAGNOSIS — F17200 Nicotine dependence, unspecified, uncomplicated: Secondary | ICD-10-CM

## 2023-08-06 DIAGNOSIS — E669 Obesity, unspecified: Secondary | ICD-10-CM

## 2023-08-06 MED ORDER — VITAMIN D (ERGOCALCIFEROL) 1.25 MG (50000 UNIT) PO CAPS
50000.0000 [IU] | ORAL_CAPSULE | ORAL | 0 refills | Status: DC
Start: 2023-08-06 — End: 2023-08-27

## 2023-08-06 NOTE — Progress Notes (Signed)
Carolyn Espinoza, D.O.  ABFM, ABOM Specializing in Clinical Bariatric Medicine  Office located at: [address] [place], [place]  [address]   Assessment and Plan:   FOR THE DISEASE OF OBESITY: BMI 38.0-38.9,adult - current BMI 38.45 Obesity with starting BMI of 39.0/date 09/25/22 Assessment & Plan: Since last office visit on 02/27/23 patient's muscle mass has decreased by 0.2 lb. Fat mass has increased by 1.8 lb. Total body water has decreased by 1.2 lb.  Counseling done on how various foods will affect these numbers and how to maximize success  Total lbs lost to date: - 3 lbs  Total weight loss percentage to date: 1.36%    Recommended Dietary Goals Carolyn Espinoza is currently in the action stage of change. As such, her goal is to continue weight management plan.  She has agreed to: continue current plan   Behavioral Intervention We discussed the following today: continue to work on maintaining a reduced calorie state, getting the recommended amount of protein, incorporating whole foods, making healthy choices, staying well hydrated and practicing mindfulness when eating.  Additional resources provided today: Handout on CAT 1-2 breakfast options, Handout on CAT 1-2 lunch options, and handout on Category 2 MP  Evidence-based interventions for health behavior change were utilized today including the discussion of self monitoring techniques, problem-solving barriers and SMART goal setting techniques.   Regarding patient's less desirable eating habits and patterns, we employed the technique of small changes.   Pt will specifically work on: n/a   Recommended Physical Activity Goals Carolyn Espinoza has been advised to work up to 150 minutes of moderate intensity aerobic activity a week and strengthening exercises 2-3 times per week for cardiovascular health, weight loss maintenance and preservation of muscle mass.   She has agreed to : Continue current level of physical activity     Pharmacotherapy We discussed various medication options to help Carolyn Espinoza with her weight loss efforts and we both agreed to : continue with nutritional and behavioral strategies and continue current anti-obesity medication regimen   FOR ASSOCIATED CONDITIONS ADDRESSED TODAY:  SOBOE (shortness of breath on exertion) with Tobacco use disorder Assessment & Plan: Pt endorses having on ongoing issue of "breathing struggles when walking", especially when starting her exercise. This has somewhat improved overtime with continued exercise. She rarely uses her inhaler. I reviewed pt's past CBC results - her hemoglobin and hematocrit have been increasing in the past 7 years. I strongly recommend pt get evaluated by pulmonology for possible COPD. Next OV will discuss potential quit date for ongoing tobacco use.    Orders: -     CBC with Differential/Platelet   Mixed stress and urge urinary incontinence Assessment & Plan: Pt with an ongoing problem of mixed stress and urge incontinence that has been worsening in the past year. She states that at times she is unable to make it to the bathroom and loses her urine  or when is she walking will sometimes lose her urine. Pt denies back pain, lower abdominal pain, fevers, malaise, nausea, and vomiting.  Her most recent urine analysis was 5 years ago and negative for blood, bilirubin, ketones, nitrates, and leukocytes. Pt has never been treated with medicine for this condition or evaluated by urology. Pt saw PCP on 07/08/2023 and states that PCP ordered her adult diapers for a lower cost which she has been using. Pt desires alternative treatment beyond wearing adult diapers.   I reached out to PCP today and recommended that she consider a  trial of incontinence medication  and or send pt to pelvic floor rehab or urologist for further evaluation. Will continue to monitor condition.  Orders: -     Comprehensive metabolic panel   Prediabetes Assessment &  Plan: No current meds. Diet/exercise approach. Will recheck labs today. Continue with weight loss therapy via prudent nutritional plan.   Orders: -    Hemoglobin A1c -     Insulin, random -     Vitamin B12   Vitamin D deficiency Assessment & Plan: Pt has been off high-dose vitamin D for an unknown period of time due to being lost to f/up. Pt will restart ERGO. Will recheck levels today to see where she is at.   Orders: -     Vitamin D (Ergocalciferol); Take 1 capsule (50,000 Units total) by mouth every 7 (seven) days.  Dispense: 8 capsule; Refill: 0 -     VITAMIN D 25 Hydroxy (Vit-D Deficiency, Fractures)   Follow up:   Return 08/27/23. She was informed of the importance of frequent follow up visits to maximize her success with intensive lifestyle modifications for her multiple health conditions.  Carolyn Espinoza is aware that we will review all of her lab results at our next visit.  She is aware that if anything is critical/ life threatening with the results, we will be contacting her via MyChart prior to the office visit to discuss management.    Subjective:   Chief complaint: Obesity Carolyn Espinoza is here to discuss her progress with her obesity treatment plan. She is on the Category 2 Plan with B/L options and journaling intake and states she is following her eating plan approximately 20% of the time. She states she is walking 10 minutes, 3 days per week.   Interval History:  Carolyn Espinoza is here for a follow up office visit. Since last OV on 02/27/2023, Carolyn Espinoza's weight has not changed. Pt is ready to take care of herself this year; she has already made several positive changes. 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 Additionally, her brother is now in a facility so her care-giver stress has decreased. Pt has been drinking more water.   Pharmacotherapy for weight loss: She is currently taking  Wellbutrin XL 150 mg daily .    Review of Systems:  Pertinent positives were addressed with patient today.  Reviewed by clinician on day of visit: allergies, medications, problem list, medical history, surgical history, family history, social history, and previous encounter notes.  Weight Summary and Biometrics   Weight Lost Since Last Visit: 0  Weight Gained Since Last Visit: 0    No data recorded Anthropometric Measurements Height: 5\' 3"  (1.6 m) Weight: 217 lb (98.4 kg) BMI (Calculated): 38.45 Weight at Last Visit: 217 lb Weight Lost Since Last Visit: 0 Weight Gained Since Last Visit: 0 Starting Weight: 220 lb   Body Composition  Body Fat %: 44.3 % Fat Mass (lbs): 96.8 lbs Muscle Mass (lbs): 115.6 lbs Total Body Water (lbs): 73 lbs Visceral Fat Rating : 7   Other Clinical Data Fasting: no Labs: no Today's Visit #: 10 Starting Date: 09/25/22   Objective:   PHYSICAL EXAM: Height 5\' 3"  (1.6 m), weight 217 lb (98.4 kg), last menstrual period 02/11/2016. Body mass index is 38.44 kg/m.  General: she is overweight, cooperative and in no acute distress. PSYCH: Has normal mood, affect and thought process.   HEENT: EOMI,  sclerae are anicteric. Lungs: Normal breathing effort, no conversational dyspnea. Extremities: Moves * 4 Neurologic: A and O * 3, good insight  DIAGNOSTIC DATA REVIEWED: BMET    Component Value Date/Time   NA 140 09/25/2022 1031   K 4.7 09/25/2022 1031   CL 98 09/25/2022 1031   CO2 19 (L) 09/25/2022 1031   GLUCOSE 72 09/25/2022 1031   GLUCOSE 116 (H) 04/28/2020 1358   BUN 7 09/25/2022 1031   CREATININE 0.80 09/25/2022 1031   CALCIUM 9.6 09/25/2022 1031   GFRNONAA >60 04/28/2020 1358   GFRAA 90 05/10/2019 0855   Lab Results  Component Value Date   HGBA1C 6.0 (H) 09/25/2022   HGBA1C 5.2 03/31/2018   Lab Results  Component Value Date   INSULIN 18.6 09/25/2022   Lab Results  Component Value Date   TSH 0.671 09/25/2022   CBC    Component Value Date/Time    WBC 9.6 09/25/2022 1031   WBC 8.6 04/28/2020 1358   RBC 5.50 (H) 09/25/2022 1031   RBC 4.79 04/28/2020 1358   HGB 16.4 (H) 09/25/2022 1031   HCT 50.9 (H) 09/25/2022 1031   PLT 241 09/25/2022 1031   MCV 93 09/25/2022 1031   MCH 29.8 09/25/2022 1031   MCH 31.7 04/28/2020 1358   MCHC 32.2 09/25/2022 1031   MCHC 33.8 04/28/2020 1358   RDW 13.2 09/25/2022 1031   Iron Studies No results found for: "IRON", "TIBC", "FERRITIN", "IRONPCTSAT" Lipid Panel     Component Value Date/Time   CHOL 180 09/25/2022 1031   TRIG 85 09/25/2022 1031   HDL 55 09/25/2022 1031   CHOLHDL 3.2 03/01/2022 1113   CHOLHDL 3.3 Ratio 02/24/2009 2137   VLDL 29 02/24/2009 2137   LDLCALC 109 (H) 09/25/2022 1031   Hepatic Function Panel     Component Value Date/Time   PROT 8.1 09/25/2022 1031   ALBUMIN 4.2 09/25/2022 1031   AST 18 09/25/2022 1031   ALT 15 09/25/2022 1031   ALKPHOS 67 09/25/2022 1031   BILITOT 0.4 09/25/2022 1031      Component Value Date/Time   TSH 0.671 09/25/2022 1031   Nutritional Lab Results  Component Value Date   VD25OH 10.1 (L) 09/25/2022    Attestations:   I, Special Puri, acting as a Stage manager for Thomasene Lot, DO., have compiled all relevant documentation for today's office visit on behalf of Thomasene Lot, DO, while in the presence of [employer], DO.  Reviewed by clinician on day of visit: allergies, medications, problem list, medical history, surgical history, family history, social history, and previous encounter notes pertinent to patient's obesity diagnosis. I have spent 45 minutes in the care of the patient today including: preparing to see patient (e.g. review and interpretation of tests, old notes ), obtaining and/or reviewing separately obtained history, performing a medically appropriate examination or evaluation, counseling and educating the patient, ordering medications, test or procedures, documenting clinical information in the electronic or other  health care record, and independently interpreting results and communicating results to the patient, family, or caregiver   I have reviewed the above documentation for accuracy and completeness, and I agree with the above. Carolyn Espinoza, D.O.  The 21st Century Cures Act was signed into law in 2016 which includes the topic of electronic health records.  This provides immediate access to information in MyChart.  This includes consultation notes, operative notes, office notes, lab results and pathology reports.  If you have any questions about what you read please let  us know at your next visit so we can discuss your concerns and take corrective action if need be.  We are right here with you.

## 2023-08-08 LAB — CBC WITH DIFFERENTIAL/PLATELET
Basophils Absolute: 0.1 10*3/uL (ref 0.0–0.2)
Basos: 1 %
EOS (ABSOLUTE): 0.1 10*3/uL (ref 0.0–0.4)
Eos: 1 %
Hematocrit: 52.9 % — ABNORMAL HIGH (ref 34.0–46.6)
Hemoglobin: 17.3 g/dL — ABNORMAL HIGH (ref 11.1–15.9)
Immature Grans (Abs): 0 10*3/uL (ref 0.0–0.1)
Immature Granulocytes: 0 %
Lymphocytes Absolute: 2.9 10*3/uL (ref 0.7–3.1)
Lymphs: 32 %
MCH: 29.5 pg (ref 26.6–33.0)
MCHC: 32.7 g/dL (ref 31.5–35.7)
MCV: 90 fL (ref 79–97)
Monocytes Absolute: 0.4 10*3/uL (ref 0.1–0.9)
Monocytes: 4 %
Neutrophils Absolute: 5.8 10*3/uL (ref 1.4–7.0)
Neutrophils: 62 %
Platelets: 233 10*3/uL (ref 150–450)
RBC: 5.87 x10E6/uL — ABNORMAL HIGH (ref 3.77–5.28)
RDW: 13.4 % (ref 11.7–15.4)
WBC: 9.2 10*3/uL (ref 3.4–10.8)

## 2023-08-08 LAB — COMPREHENSIVE METABOLIC PANEL
ALT: 15 [IU]/L (ref 0–32)
AST: 15 [IU]/L (ref 0–40)
Albumin: 4.2 g/dL (ref 3.8–4.9)
Alkaline Phosphatase: 70 [IU]/L (ref 44–121)
BUN/Creatinine Ratio: 14 (ref 9–23)
BUN: 10 mg/dL (ref 6–24)
Bilirubin Total: 0.3 mg/dL (ref 0.0–1.2)
CO2: 24 mmol/L (ref 20–29)
Calcium: 10.2 mg/dL (ref 8.7–10.2)
Chloride: 101 mmol/L (ref 96–106)
Creatinine, Ser: 0.74 mg/dL (ref 0.57–1.00)
Globulin, Total: 3.7 g/dL (ref 1.5–4.5)
Glucose: 113 mg/dL — ABNORMAL HIGH (ref 70–99)
Potassium: 4.4 mmol/L (ref 3.5–5.2)
Sodium: 142 mmol/L (ref 134–144)
Total Protein: 7.9 g/dL (ref 6.0–8.5)
eGFR: 93 mL/min/{1.73_m2} (ref >59)

## 2023-08-08 LAB — HEMOGLOBIN A1C
Est. average glucose Bld gHb Est-mCnc: 128 mg/dL
Hgb A1c MFr Bld: 6.1 % — ABNORMAL HIGH (ref 4.8–5.6)

## 2023-08-08 LAB — VITAMIN B12: Vitamin B-12: 373 pg/mL (ref 232–1245)

## 2023-08-08 LAB — VITAMIN D 25 HYDROXY (VIT D DEFICIENCY, FRACTURES): Vit D, 25-Hydroxy: 25.9 ng/mL — ABNORMAL LOW (ref 30.0–100.0)

## 2023-08-08 LAB — INSULIN, RANDOM: INSULIN: 190 u[IU]/mL — ABNORMAL HIGH (ref 2.6–24.9)

## 2023-08-27 ENCOUNTER — Ambulatory Visit (INDEPENDENT_AMBULATORY_CARE_PROVIDER_SITE_OTHER): Payer: Medicaid Other | Admitting: Family Medicine

## 2023-08-27 ENCOUNTER — Encounter (INDEPENDENT_AMBULATORY_CARE_PROVIDER_SITE_OTHER): Payer: Self-pay | Admitting: Family Medicine

## 2023-08-27 VITALS — BP 124/78 | HR 86 | Temp 97.9°F | Ht 63.0 in | Wt 223.0 lb

## 2023-08-27 DIAGNOSIS — Z6838 Body mass index (BMI) 38.0-38.9, adult: Secondary | ICD-10-CM

## 2023-08-27 DIAGNOSIS — R109 Unspecified abdominal pain: Secondary | ICD-10-CM | POA: Diagnosis not present

## 2023-08-27 DIAGNOSIS — F17200 Nicotine dependence, unspecified, uncomplicated: Secondary | ICD-10-CM

## 2023-08-27 DIAGNOSIS — N3946 Mixed incontinence: Secondary | ICD-10-CM

## 2023-08-27 DIAGNOSIS — E559 Vitamin D deficiency, unspecified: Secondary | ICD-10-CM

## 2023-08-27 DIAGNOSIS — Z6839 Body mass index (BMI) 39.0-39.9, adult: Secondary | ICD-10-CM

## 2023-08-27 DIAGNOSIS — R7303 Prediabetes: Secondary | ICD-10-CM

## 2023-08-27 DIAGNOSIS — F17210 Nicotine dependence, cigarettes, uncomplicated: Secondary | ICD-10-CM | POA: Diagnosis not present

## 2023-08-27 DIAGNOSIS — D751 Secondary polycythemia: Secondary | ICD-10-CM

## 2023-08-27 DIAGNOSIS — R0602 Shortness of breath: Secondary | ICD-10-CM | POA: Diagnosis not present

## 2023-08-27 DIAGNOSIS — E538 Deficiency of other specified B group vitamins: Secondary | ICD-10-CM

## 2023-08-27 DIAGNOSIS — E669 Obesity, unspecified: Secondary | ICD-10-CM

## 2023-08-27 MED ORDER — CYANOCOBALAMIN 500 MCG PO TABS
500.0000 ug | ORAL_TABLET | Freq: Every day | ORAL | Status: DC
Start: 2023-08-27 — End: 2024-05-31

## 2023-08-27 MED ORDER — VITAMIN D (ERGOCALCIFEROL) 1.25 MG (50000 UNIT) PO CAPS: 50000.0000 [IU] | ORAL_CAPSULE | ORAL | 0 refills | Status: DC

## 2023-08-27 NOTE — Progress Notes (Signed)
 Carolyn Espinoza, D.O.  ABFM, ABOM Specializing in Clinical Bariatric Medicine  Office located at: [address] [place], [place]  [address]   Assessment and Plan:   FOR THE DISEASE OF OBESITY: BMI 38.0-38.9,adult - current BMI 39.51 Obesity with starting BMI of 39.0/date 09/25/22 Assessment & Plan: Since last office visit on 08/06/2023 patient's  Muscle mass has increased by 3.2 lbs. Fat mass has increased by 1 lb. Total body water has increased by 4.6 lbs.  Counseling done on how various foods will affect these numbers and how to maximize success  Total lbs lost to date: +3 lbs Total weight loss percentage to date: +1.36%    Recommended Dietary Goals Carolyn Espinoza is currently in the action stage of change. As such, her goal is to continue weight management plan.  She has agreed to: continue current plan   Behavioral Intervention We discussed the following today: continue to work on maintaining a reduced calorie state, getting the recommended amount of protein, incorporating whole foods, making healthy choices, staying well hydrated and practicing mindfulness when eating.  Additional resources provided today: handout on CAT 1 meal plan , Handout on CAT 1-2 breakfast options, and Handout on CAT 1-2 lunch options  Evidence-based interventions for health behavior change were utilized today including the discussion of self monitoring techniques, problem-solving barriers and SMART goal setting techniques.   Regarding patient's less desirable eating habits and patterns, we employed the technique of small changes.   Pt will specifically work on: increasing adherence to her CAT 1 MP   Recommended Physical Activity Goals Carolyn Espinoza has been advised to work up to 150 minutes of moderate intensity aerobic activity a week and strengthening exercises 2-3 times per week for cardiovascular health, weight loss maintenance and preservation of muscle mass.   She has agreed to : Continue current level  of physical activity    Pharmacotherapy We both agreed to : continue with nutritional and behavioral strategies and continue Wellbutrin  at current dose.    FOR ASSOCIATED CONDITIONS ADDRESSED TODAY:  SOBOE (shortness of breath on exertion) Tobacco use disorder Assessment & Plan: Per last OV note on 08/06/2023: Pt endorses having on ongoing issue of breathing struggles when walking, especially when starting her exercise. This has somewhat improved overtime with continued exercise. She rarely uses her inhaler. I again strongly recommend pt meet with PCP for further care and discuss obtaining a referall to pulmonology for evaluation of  possible COPD. Will discuss potential quit date for ongoing tobacco use in the near future.   Mixed stress and urge urinary incontinence Assessment & Plan: Per last OV note on 08/06/2023:  Pt with an ongoing problem of mixed stress and urge incontinence that has been worsening in the past year. She states that at times she is unable to make it to the bathroom and loses her urine  or when is she walking will sometimes lose her urine. Pt denies back pain, lower abdominal pain, fevers, malaise, nausea, and vomiting.  Her most recent urine analysis was 5 years ago and negative for blood, bilirubin, ketones, nitrates, and leukocytes. Pt has never been treated with medicine for this condition or evaluated by urology. Pt saw PCP on 07/08/2023 and states that PCP ordered her adult diapers for a lower cost which she has been using. Pt desires alternative treatment beyond wearing adult diapers.  I again strongly recommend pt discuss further care with PCP and obtain referral to urologist.    Erythrocytosis Assessment & Plan: Lab  Results  Component Value Date   WBC 9.2 08/06/2023   HGB 17.3 (H) 08/06/2023   HCT 52.9 (H) 08/06/2023   MCV 90 08/06/2023   PLT 233 08/06/2023   Most recent CBC above - results reviewed with pt. Her hemoglobin and hematocrit have been  trending upward in the past 7 years. This could be related to her tabacco use and possible COPD. Pt highly encouraged to f/up with PCP for further care.   Stomach pain-  gastric pain/ burning worse with food Assessment & Plan: Pt endorses having gastric pain + burning sensation which is worsened with food. This could be possible gastric ulcer. Pt encouraged to meet with her GI specialist for further care prior to her colonoscopy.    Prediabetes Assessment & Plan: Lab Results  Component Value Date   HGBA1C 6.1 (H) 08/06/2023   HGBA1C 6.0 (H) 09/25/2022   HGBA1C 5.6 10/17/2021   INSULIN  190.0 (H) 08/06/2023   INSULIN  18.6 09/25/2022   Lab Results  Component Value Date   CREATININE 0.74 08/06/2023   BUN 10 08/06/2023   NA 142 08/06/2023   K 4.4 08/06/2023   CL 101 08/06/2023   CO2 24 08/06/2023      Component Value Date/Time   PROT 7.9 08/06/2023 0955   ALBUMIN 4.2 08/06/2023 0955   AST 15 08/06/2023 0955   ALT 15 08/06/2023 0955   ALKPHOS 70 08/06/2023 0955   BILITOT 0.3 08/06/2023 0955    Diet/exercise approach. Discussed most recent labs above with pt. Her hemoglobin A1c has worsened from 6.0 to 6.1. Her fasting insulin  is significantly high. Goal <5. Kidney function, electrolytes, and liver enzymes are within normal limits. Continue working on nutrition plan to decrease simple carbohydrates, increase lean proteins and exercise to promote weight loss and improve glycemic control and prevent progression to T2DM.   B12 deficiency Assessment & Plan: Lab Results  Component Value Date   VITAMINB12 373 08/06/2023   B12 level is 373, not at goal of over 500. She is taking a daily Multi-Vitamin and thinks it may have some B12. Pt instructed to start OTC Cyanocobalamin  500 mcg daily. Will recheck levels in 3 months or so.   Orders:  -     Cyanocobalamin ; Take 1 tablet (500 mcg total) by mouth daily.   Vitamin D  deficiency Assessment & Plan: Lab Results  Component Value  Date   VD25OH 25.9 (L) 08/06/2023   VD25OH 10.1 (L) 09/25/2022   Her vitamin D  levels are sub-optimal. Goal: 50 to 70-80. She admits to not consistently taking her high dose vitamin D . Pt encouraged to take her Vitamin D  as prescribed. Will recheck levels in 3 months.   Orders:  -     Vitamin D  (Ergocalciferol ); Take 1 capsule (50,000 Units total) by mouth every 7 (seven) days.  Dispense: 8 capsule; Refill: 0   Follow up:   Return 10/09/2023. She was informed of the importance of frequent follow up visits to maximize her success with intensive lifestyle modifications for her multiple health conditions.  Subjective:   Chief complaint: Obesity Carolyn Espinoza is here to discuss her progress with her obesity treatment plan. She is on the Category 1 Plan with B/L options and journaling intake and states she is following her eating plan approximately 50% of the time. She states she is on the treadmill 5 minutes 3 days per week.  Interval History:  Carolyn Espinoza is here for a follow up office visit. Pt has not been able to  fully focus on her weight loss journey due to other health complications: shortness of breath on Exertion, stomach pain, and mixed stress and urge urinary incontinence. Will review labs ordered last OV.   Pharmacotherapy for weight loss: She is currently taking Wellbutrin  XL 150 mg daily .   Review of Systems:  Pertinent positives were addressed with patient today.  Reviewed by clinician on day of visit: allergies, medications, problem list, medical history, surgical history, family history, social history, and previous encounter notes.  Weight Summary and Biometrics   Weight Lost Since Last Visit: 0  Weight Gained Since Last Visit: 5lb  Vitals Temp: 97.9 F (36.6 C) BP: 124/78 Pulse Rate: 86 SpO2: 95 %   Anthropometric Measurements Height: 5' 3 (1.6 m) Weight: 223 lb (101.2 kg) BMI (Calculated): 39.51 Weight at Last Visit: 218lb Weight Lost Since Last Visit:  0 Weight Gained Since Last Visit: 5lb Starting Weight: 220lb Total Weight Loss (lbs): 0 lb (0 kg)   Body Composition  Body Fat %: 43.9 % Fat Mass (lbs): 97.8 lbs Muscle Mass (lbs): 118.8 lbs Total Body Water (lbs): 77.6 lbs Visceral Fat Rating : 14   Other Clinical Data Fasting: no Labs: no Today's Visit #: 11 Starting Date: 09/25/22   Objective:   PHYSICAL EXAM: Blood pressure 124/78, pulse 86, temperature 97.9 F (36.6 C), height 5' 3 (1.6 m), weight 223 lb (101.2 kg), last menstrual period 02/11/2016, SpO2 95%. Body mass index is 39.5 kg/m.  General: she is overweight, cooperative and in no acute distress. PSYCH: Has normal mood, affect and thought process.   HEENT: EOMI, sclerae are anicteric. Lungs: Normal breathing effort, no conversational dyspnea. Extremities: Moves * 4 Neurologic: A and O * 3, good insight  DIAGNOSTIC DATA REVIEWED: BMET    Component Value Date/Time   NA 142 08/06/2023 0955   K 4.4 08/06/2023 0955   CL 101 08/06/2023 0955   CO2 24 08/06/2023 0955   GLUCOSE 113 (H) 08/06/2023 0955   GLUCOSE 116 (H) 04/28/2020 1358   BUN 10 08/06/2023 0955   CREATININE 0.74 08/06/2023 0955   CALCIUM  10.2 08/06/2023 0955   GFRNONAA >60 04/28/2020 1358   GFRAA 90 05/10/2019 0855   Lab Results  Component Value Date   HGBA1C 6.1 (H) 08/06/2023   HGBA1C 5.2 03/31/2018   Lab Results  Component Value Date   INSULIN  190.0 (H) 08/06/2023   INSULIN  18.6 09/25/2022   Lab Results  Component Value Date   TSH 0.671 09/25/2022   CBC    Component Value Date/Time   WBC 9.2 08/06/2023 0955   WBC 8.6 04/28/2020 1358   RBC 5.87 (H) 08/06/2023 0955   RBC 4.79 04/28/2020 1358   HGB 17.3 (H) 08/06/2023 0955   HCT 52.9 (H) 08/06/2023 0955   PLT 233 08/06/2023 0955   MCV 90 08/06/2023 0955   MCH 29.5 08/06/2023 0955   MCH 31.7 04/28/2020 1358   MCHC 32.7 08/06/2023 0955   MCHC 33.8 04/28/2020 1358   RDW 13.4 08/06/2023 0955   Iron Studies No results  found for: IRON, TIBC, FERRITIN, IRONPCTSAT Lipid Panel     Component Value Date/Time   CHOL 180 09/25/2022 1031   TRIG 85 09/25/2022 1031   HDL 55 09/25/2022 1031   CHOLHDL 3.2 03/01/2022 1113   CHOLHDL 3.3 Ratio 02/24/2009 2137   VLDL 29 02/24/2009 2137   LDLCALC 109 (H) 09/25/2022 1031   Hepatic Function Panel     Component Value Date/Time   PROT 7.9 08/06/2023  0955   ALBUMIN 4.2 08/06/2023 0955   AST 15 08/06/2023 0955   ALT 15 08/06/2023 0955   ALKPHOS 70 08/06/2023 0955   BILITOT 0.3 08/06/2023 0955      Component Value Date/Time   TSH 0.671 09/25/2022 1031   Nutritional Lab Results  Component Value Date   VD25OH 25.9 (L) 08/06/2023   VD25OH 10.1 (L) 09/25/2022    Attestations:   I, Special Puri, acting as a stage manager for Carolyn Jenkins, DO., have compiled all relevant documentation for today's office visit on behalf of Carolyn Jenkins, DO, while in the presence of [employer], DO.  Reviewed by clinician on day of visit: allergies, medications, problem list, medical history, surgical history, family history, social history, and previous encounter notes pertinent to patient's obesity diagnosis. I have spent 45 minutes in the care of the patient today including: preparing to see patient (e.g. review and interpretation of tests, old notes ), obtaining and/or reviewing separately obtained history, performing a medically appropriate examination or evaluation, counseling and educating the patient, ordering medications, test or procedures, documenting clinical information in the electronic or other health care record, and independently interpreting results and communicating results to the patient, family, or caregiver   I have reviewed the above documentation for accuracy and completeness, and I agree with the above. Carolyn Espinoza, D.O.  The 21st Century Cures Act was signed into law in 2016 which includes the topic of electronic health records.  This  provides immediate access to information in MyChart.  This includes consultation notes, operative notes, office notes, lab results and pathology reports.  If you have any questions about what you read please let us  know at your next visit so we can discuss your concerns and take corrective action if need be.  We are right here with you.

## 2023-09-04 ENCOUNTER — Telehealth (INDEPENDENT_AMBULATORY_CARE_PROVIDER_SITE_OTHER): Payer: Self-pay | Admitting: Primary Care

## 2023-09-04 NOTE — Telephone Encounter (Signed)
AttnMerilynn Finland, Toney Sang, RMA    Vickie from [employer] is calling back regarding clinical notes that were never received. Can you please fax over recent clinical notes fax# [phone]   She will also be re-faxing the request to you,

## 2023-09-04 NOTE — Telephone Encounter (Signed)
Called pt to remind them about atp. Pt will be present

## 2023-09-05 NOTE — Telephone Encounter (Signed)
Office note has been re faxed.

## 2023-09-09 ENCOUNTER — Telehealth (INDEPENDENT_AMBULATORY_CARE_PROVIDER_SITE_OTHER): Payer: Self-pay | Admitting: Primary Care

## 2023-09-09 NOTE — Telephone Encounter (Signed)
Called pt to make them aware that atp was moved due to weather.

## 2023-09-11 ENCOUNTER — Ambulatory Visit (INDEPENDENT_AMBULATORY_CARE_PROVIDER_SITE_OTHER): Payer: Medicaid Other | Admitting: Primary Care

## 2023-09-12 DIAGNOSIS — R32 Unspecified urinary incontinence: Secondary | ICD-10-CM | POA: Diagnosis not present

## 2023-09-12 DIAGNOSIS — I10 Essential (primary) hypertension: Secondary | ICD-10-CM | POA: Diagnosis not present

## 2023-09-17 ENCOUNTER — Ambulatory Visit (INDEPENDENT_AMBULATORY_CARE_PROVIDER_SITE_OTHER): Payer: Medicaid Other | Admitting: Primary Care

## 2023-09-18 ENCOUNTER — Ambulatory Visit (INDEPENDENT_AMBULATORY_CARE_PROVIDER_SITE_OTHER): Payer: Medicaid Other | Admitting: Primary Care

## 2023-09-18 ENCOUNTER — Encounter (INDEPENDENT_AMBULATORY_CARE_PROVIDER_SITE_OTHER): Payer: Self-pay | Admitting: Primary Care

## 2023-09-18 VITALS — BP 140/88 | HR 73 | Wt 223.0 lb

## 2023-09-18 DIAGNOSIS — I10 Essential (primary) hypertension: Secondary | ICD-10-CM

## 2023-09-18 DIAGNOSIS — E782 Mixed hyperlipidemia: Secondary | ICD-10-CM | POA: Diagnosis not present

## 2023-09-18 DIAGNOSIS — F17200 Nicotine dependence, unspecified, uncomplicated: Secondary | ICD-10-CM

## 2023-09-18 NOTE — Progress Notes (Deleted)
 Subjective:   Carolyn Espinoza is a 58 y.o. female who presents for an Initial Medicare Annual Wellness Visit.  Visit Complete: {VISITMETHODVS:[id number]}  Patient Medicare AWV questionnaire was completed by the patient on ***; I have confirmed that all information answered by patient is correct and no changes since this date.        Objective:    Today's Vitals   09/18/23 1026  BP: (!) 144/93  Pulse: 73  SpO2: 95%  Weight: 223 lb (101.2 kg)   Body mass index is 39.5 kg/m.     08/26/2016    1:15 PM 11/01/2015    8:52 PM 08/20/2015    1:06 AM  Advanced Directives  Does Patient Have a Medical Advance Directive? No No No  Would patient like information on creating a medical advance directive?   No - patient declined information    Current Medications (verified) Outpatient Encounter Medications as of 09/18/2023  Medication Sig  . albuterol (VENTOLIN HFA) 108 (90 Base) MCG/ACT inhaler Inhale 2 puffs into the lungs every 4 (four) hours as needed for wheezing or shortness of breath.  Marland Kitchen amLODipine (NORVASC) 10 MG tablet Take 1 tablet daily  . buPROPion (WELLBUTRIN XL) 150 MG 24 hr tablet Take 1 tablet (150 mg total) by mouth every morning.  . cyanocobalamin (VITAMIN B12) 500 MCG tablet Take 1 tablet (500 mcg total) by mouth daily.  Marland Kitchen lisinopril-hydrochlorothiazide (ZESTORETIC) 20-12.5 MG tablet Take 2 tablets by mouth daily.  . Multiple Vitamin (MULTIVITAMIN WITH MINERALS) TABS tablet Take 1 tablet by mouth daily.  Marland Kitchen triamcinolone cream (KENALOG) 0.1 % Apply 1 Application topically 2 (two) times daily.  . Vitamin D, Ergocalciferol, (DRISDOL) 1.25 MG (50000 UNIT) CAPS capsule Take 1 capsule (50,000 Units total) by mouth every 7 (seven) days.   No facility-administered encounter medications on file as of 09/18/2023.    Allergies (verified) Patient has no known allergies.   History: Past Medical History:  Diagnosis Date  . Asthma   . Back pain   . Constipation   . HTN  (hypertension) 07/22/2010   Past Surgical History:  Procedure Laterality Date  . COLONOSCOPY    . DENTAL SURGERY  2003   multiple teeth removed  . POLYPECTOMY     Family History  Problem Relation Age of Onset  . Heart failure Mother   . Diabetes Mother   . Hypertension Mother   . Kidney disease Mother   . Heart disease Mother   . Cancer Father   . Hypertension Father   . Prostate cancer Father   . COPD Father   . Colon polyps Sister   . Colon cancer Neg Hx   . Esophageal cancer Neg Hx   . Stomach cancer Neg Hx   . Rectal cancer Neg Hx    Social History   Socioeconomic History  . Marital status: Single    Spouse name: Not on file  . Number of children: Not on file  . Years of education: Not on file  . Highest education level: Not on file  Occupational History  . Occupation: Cook  . Occupation: Custodian  Tobacco Use  . Smoking status: Every Day    Current packs/day: 0.50    Average packs/day: 0.5 packs/day for 30.0 years (15.0 ttl pk-yrs)    Types: Cigarettes  . Smokeless tobacco: Never  Vaping Use  . Vaping status: Never Used  Substance and Sexual Activity  . Alcohol use: Yes    Comment: occasional  . Drug  use: No  . Sexual activity: Yes  Other Topics Concern  . Not on file  Social History Narrative   Patient lives alone.   Comes to appt by bus.   Does not exercise regularly.   Smokes cigarettes.   No recreational drug use.   Drinks 1 beer or liquor drink per day.         Social Drivers of Corporate investment banker Strain: Not on file  Food Insecurity: Not on file  Transportation Needs: Not on file  Physical Activity: Not on file  Stress: Not on file  Social Connections: Unknown (12/04/2021)   Received from [facility], [facility]   Social Network   . Social Network: Not on file    Tobacco Counseling Ready to quit: Not Answered Counseling given: Not Answered   Clinical Intake:     Pain : No/denies pain                   Activities of Daily Living     No data to display           Patient Care Team: Grayce Sessions, NP as PCP - General (Internal Medicine)  Indicate any recent Medical Services you may have received from other than [facility] providers in the past year (date may be approximate).     Assessment:   This is a routine wellness examination for Carolyn Espinoza.  Hearing/Vision screen No results found.   Goals Addressed   None   Depression Screen    07/08/2023    1:54 PM 08/20/2022    3:15 PM 03/01/2022   10:15 AM 10/17/2021    2:22 PM 04/05/2021    3:25 PM 02/03/2020   11:08 AM 08/03/2019    2:00 PM  PHQ 2/9 Scores  PHQ - 2 Score 1 3 2 2 2  0 1  PHQ- 9 Score  6 8 9 6       Fall Risk    09/18/2023   10:26 AM 01/20/2023    9:49 AM 08/20/2022    3:15 PM 03/01/2022   10:14 AM 10/17/2021    2:22 PM  Fall Risk   Falls in the past year? 0 0 0 0 0  Number falls in past yr: 0 0 0    Injury with Fall? 0 0 0    Risk for fall due to : No Fall Risks No Fall Risks No Fall Risks    Follow up Falls evaluation completed        MEDICARE RISK AT HOME:    TIMED UP AND GO:  Was the test performed? {AMBTIMEDUPGO:[id number]}    Cognitive Function:        Immunizations Immunization History  Administered Date(s) Administered  . Influenza, Quadrivalent, Recombinant, Inj, Pf 06/27/2020  . Influenza, Seasonal, Injecte, Preservative Fre 06/07/2023  . Influenza,inj,Quad PF,6+ Mos 03/31/2018, 05/03/2019, 10/17/2021, 08/20/2022  . PFIZER(Purple Top)SARS-COV-2 Vaccination 09/17/2019, 10/08/2019  . Pfizer(Comirnaty)Fall Seasonal Vaccine 12 years and older 06/07/2023  . Td 01/31/2009  . Tdap 08/03/2019  . Zoster Recombinant(Shingrix) 03/01/2022    {TDAP status:2101805}  {Flu Vaccine status:2101806}  {Pneumococcal vaccine status:2101807}  {Covid-19 vaccine status:2101808}  Qualifies for Shingles Vaccine? {YES/NO:21197}  Zostavax completed {YES/NO:21197}  {Shingrix  Completed?:2101804}  Screening Tests Health Maintenance  Topic Date Due  . Pneumococcal Vaccine 33-26 Years old (1 of 2 - PCV) Never done  . Zoster Vaccines- Shingrix (2 of 2) 04/26/2022  . Colonoscopy  07/11/2022  . Cervical Cancer Screening (HPV/Pap Cotest)  02/28/2023  .  MAMMOGRAM  10/20/2024  . DTaP/Tdap/Td (3 - Td or Tdap) 08/02/2029  . INFLUENZA VACCINE  Completed  . COVID-19 Vaccine  Completed  . Hepatitis C Screening  Completed  . HIV Screening  Completed  . HPV VACCINES  Aged Out    Health Maintenance  Health Maintenance Due  Topic Date Due  . Pneumococcal Vaccine 61-39 Years old (1 of 2 - PCV) Never done  . Zoster Vaccines- Shingrix (2 of 2) 04/26/2022  . Colonoscopy  07/11/2022  . Cervical Cancer Screening (HPV/Pap Cotest)  02/28/2023    {Colorectal cancer screening:2101809}  {Mammogram status:21018020}  {Bone Density status:21018021}  Lung Cancer Screening: (Low Dose CT Chest recommended if Age 56-80 years, 20 pack-year currently smoking OR have quit w/in 15years.) {DOES NOT does:27190::"does not"} qualify.   Lung Cancer Screening Referral: ***  Additional Screening:  Hepatitis C Screening: {DOES NOT does:27190::"does not"} qualify; Completed ***  Vision Screening: Recommended annual ophthalmology exams for early detection of glaucoma and other disorders of the eye. Is the patient up to date with their annual eye exam?  {YES/NO:21197} Who is the provider or what is the name of the office in which the patient attends annual eye exams? *** If pt is not established with a provider, would they like to be referred to a provider to establish care? {YES/NO:21197}.   Dental Screening: Recommended annual dental exams for proper oral hygiene  Diabetic Foot Exam: {Diabetic Foot Exam:2101802}  Community Resource Referral / Chronic Care Management: CRR required this visit?  {YES/NO:21197}  CCM required this visit?  {CCM Required choices:[phone]}     Plan:      I have personally reviewed and noted the following in the patient's chart:   Medical and social history Use of alcohol, tobacco or illicit drugs  Current medications and supplements including opioid prescriptions. {Opioid Prescriptions:[phone]} Functional ability and status Nutritional status Physical activity Advanced directives List of other physicians Hospitalizations, surgeries, and ER visits in previous 12 months Vitals Screenings to include cognitive, depression, and falls Referrals and appointments  In addition, I have reviewed and discussed with patient certain preventive protocols, quality metrics, and best practice recommendations. A written personalized care plan for preventive services as well as general preventive health recommendations were provided to patient.     Philippa Chester, CMA   09/18/2023   After Visit Summary: {CHL AMB AWV After Visit Summary:[id number]}  Nurse Notes: ***

## 2023-09-18 NOTE — Progress Notes (Signed)
 [facility]  Carolyn Espinoza, is a 58 y.o. female  YQM:[id number]  BMW:[id number]  DOB - 11/09/1965  Chief Complaint  Patient presents with   Medical Management of Chronic Issues       Subjective:   [name] is a 58 y.o. female here today for a follow up visit.  She initially was supposed to be seen for lab work however sent her to healthy weight and wellness at her last blood draw except lipids.  Patient is fasting and on a clear liquid after this lipid panel.  She is scheduled for her colonoscopy on tomorrow.  Her blood pressure is elevated she stated she has not taken her blood pressure medication today and she is stressed over this colonoscopy that is to be done tomorrow. She has  No headache, No chest pain, No abdominal pain - No Nausea, No new weakness tingling or numbness, No Cough - shortness of breath   No problems updated.  Comprehensive ROS Pertinent positive and negative noted in HPI   No Known Allergies  Past Medical History:  Diagnosis Date   Asthma    Back pain    Constipation    HTN (hypertension) 07/22/2010    Current Outpatient Medications on File Prior to Visit  Medication Sig Dispense Refill   albuterol (VENTOLIN HFA) 108 (90 Base) MCG/ACT inhaler Inhale 2 puffs into the lungs every 4 (four) hours as needed for wheezing or shortness of breath. 18 g 1   amLODipine (NORVASC) 10 MG tablet Take 1 tablet daily 90 tablet 1   buPROPion (WELLBUTRIN XL) 150 MG 24 hr tablet Take 1 tablet (150 mg total) by mouth every morning. 90 tablet 0   cyanocobalamin (VITAMIN B12) 500 MCG tablet Take 1 tablet (500 mcg total) by mouth daily.     lisinopril-hydrochlorothiazide (ZESTORETIC) 20-12.5 MG tablet Take 2 tablets by mouth daily. 180 tablet 1   Multiple Vitamin (MULTIVITAMIN WITH MINERALS) TABS tablet Take 1 tablet by mouth daily.     triamcinolone cream (KENALOG) 0.1 % Apply 1 Application topically 2 (two) times daily. 15 g 0   Vitamin D,  Ergocalciferol, (DRISDOL) 1.25 MG (50000 UNIT) CAPS capsule Take 1 capsule (50,000 Units total) by mouth every 7 (seven) days. 8 capsule 0   No current facility-administered medications on file prior to visit.   Health Maintenance  Topic Date Due   Pneumococcal Vaccination (1 of 2 - PCV) Never done   Zoster (Shingles) Vaccine (2 of 2) 04/26/2022   Colon Cancer Screening  07/11/2022   Pap with HPV screening  02/28/2023   Mammogram  10/20/2024   DTaP/Tdap/Td vaccine (3 - Td or Tdap) 08/02/2029   Flu Shot  Completed   COVID-19 Vaccine  Completed   Hepatitis C Screening  Completed   HIV Screening  Completed   HPV Vaccine  Aged Out    Objective:   Vitals:   09/18/23 1026 09/18/23 1138  BP: (!) 144/93 (!) 140/88  Pulse: 73   SpO2: 95%   Weight: 223 lb (101.2 kg)    Physical Exam Vitals reviewed.  Constitutional:      Appearance: She is obese.  HENT:     Right Ear: Tympanic membrane and external ear normal.     Left Ear: Tympanic membrane and external ear normal.     Nose: Nose normal.  Eyes:     Extraocular Movements: Extraocular movements intact.  Cardiovascular:     Rate and Rhythm: Normal rate and regular rhythm.  Pulmonary:  Effort: Pulmonary effort is normal.     Breath sounds: Normal breath sounds.  Abdominal:     General: Bowel sounds are normal. There is distension.     Palpations: Abdomen is soft.  Musculoskeletal:        General: Normal range of motion.     Cervical back: Normal range of motion and neck supple.  Skin:    General: Skin is warm and dry.  Neurological:     Mental Status: She is alert and oriented to person, place, and time.  Psychiatric:        Mood and Affect: Mood normal.        Behavior: Behavior normal.      Assessment & Plan  Carolyn Espinoza was seen today for medical management of chronic issues.  Diagnoses and all orders for this visit:  Essential hypertension BP goal - < 130/80 Explained that having normal blood pressure is the goal  and medications are helping to get to goal and maintain normal blood pressure. DIET: Limit salt intake, read nutrition labels to check salt content, limit fried and high fatty foods  Avoid using multisymptom OTC cold preparations that generally contain sudafed which can rise BP. Consult with pharmacist on best cold relief products to use for persons with HTN EXERCISE Discussed incorporating exercise such as walking - 30 minutes most days of the week and can do in 10 minute intervals     Mixed hyperlipidemia  Healthy lifestyle diet of fruits vegetables fish nuts whole grains and low saturated fat . Foods high in cholesterol or liver, fatty meats,cheese, butter avocados, nuts and seeds, chocolate and fried foods.  Tobacco use disorder - I have recommended complete cessation of tobacco use. I have discussed various options available for assistance with tobacco cessation including over the counter methods . When she takes her Wellbutrin XL decrease taste but for tobacco  Obesity with starting BMI of 39.0/date 09/25/22 Attending a weight nutrition program.      Patient have been counseled extensively about nutrition and exercise. Other issues discussed during this visit include: low cholesterol diet, weight control and daily exercise, foot care, annual eye examinations at Ophthalmology, importance of adherence with medications and regular follow-up. We also discussed long term complications of uncontrolled diabetes and hypertension.   Return in about 3 months (around 12/16/2023) for HTN.  The patient was given clear instructions to go to ER or return to medical center if symptoms don't improve, worsen or new problems develop. The patient verbalized understanding. The patient was told to call to get lab results if they haven't heard anything in the next week.   This note has been created with Education officer, environmental. Any transcriptional errors are unintentional.    Grayce Sessions, NP 09/18/2023, 12:01 PM

## 2023-09-19 DIAGNOSIS — K5730 Diverticulosis of large intestine without perforation or abscess without bleeding: Secondary | ICD-10-CM | POA: Diagnosis not present

## 2023-09-19 DIAGNOSIS — R1013 Epigastric pain: Secondary | ICD-10-CM | POA: Diagnosis not present

## 2023-09-19 DIAGNOSIS — D123 Benign neoplasm of transverse colon: Secondary | ICD-10-CM | POA: Diagnosis not present

## 2023-09-19 DIAGNOSIS — K2950 Unspecified chronic gastritis without bleeding: Secondary | ICD-10-CM | POA: Diagnosis not present

## 2023-09-19 DIAGNOSIS — D126 Benign neoplasm of colon, unspecified: Secondary | ICD-10-CM | POA: Diagnosis not present

## 2023-09-19 DIAGNOSIS — D122 Benign neoplasm of ascending colon: Secondary | ICD-10-CM | POA: Diagnosis not present

## 2023-09-19 DIAGNOSIS — K2930 Chronic superficial gastritis without bleeding: Secondary | ICD-10-CM | POA: Diagnosis not present

## 2023-09-19 DIAGNOSIS — Z09 Encounter for follow-up examination after completed treatment for conditions other than malignant neoplasm: Secondary | ICD-10-CM | POA: Diagnosis not present

## 2023-09-19 DIAGNOSIS — Z860101 Personal history of adenomatous and serrated colon polyps: Secondary | ICD-10-CM | POA: Diagnosis not present

## 2023-09-29 ENCOUNTER — Telehealth (INDEPENDENT_AMBULATORY_CARE_PROVIDER_SITE_OTHER): Payer: Self-pay | Admitting: Primary Care

## 2023-09-29 NOTE — Telephone Encounter (Signed)
 Spoke to pt about appt.. Will be present

## 2023-10-07 ENCOUNTER — Ambulatory Visit (INDEPENDENT_AMBULATORY_CARE_PROVIDER_SITE_OTHER): Payer: Self-pay | Admitting: Primary Care

## 2023-10-09 ENCOUNTER — Encounter (INDEPENDENT_AMBULATORY_CARE_PROVIDER_SITE_OTHER): Payer: Self-pay | Admitting: Family Medicine

## 2023-10-09 ENCOUNTER — Ambulatory Visit (INDEPENDENT_AMBULATORY_CARE_PROVIDER_SITE_OTHER): Payer: Medicaid Other | Admitting: Family Medicine

## 2023-10-09 VITALS — BP 131/86 | HR 69 | Temp 97.7°F | Ht 63.0 in | Wt 222.0 lb

## 2023-10-09 DIAGNOSIS — E669 Obesity, unspecified: Secondary | ICD-10-CM | POA: Diagnosis not present

## 2023-10-09 DIAGNOSIS — E559 Vitamin D deficiency, unspecified: Secondary | ICD-10-CM | POA: Diagnosis not present

## 2023-10-09 DIAGNOSIS — Z6839 Body mass index (BMI) 39.0-39.9, adult: Secondary | ICD-10-CM

## 2023-10-09 MED ORDER — VITAMIN D (ERGOCALCIFEROL) 1.25 MG (50000 UNIT) PO CAPS
50000.0000 [IU] | ORAL_CAPSULE | ORAL | 0 refills | Status: DC
Start: 2023-10-09 — End: 2023-12-03

## 2023-10-09 NOTE — Progress Notes (Signed)
 Carolyn Espinoza, D.O.  ABFM, ABOM Specializing in Clinical Bariatric Medicine  Office located at: [address] [place], [place]  [address]   Assessment and Plan:   FOR THE DISEASE OF OBESITY: BMI 39.0-39.9,adult - Current BMI 39.34 Obesity with starting BMI of 39.0/date 09/25/22 Assessment & Plan: Since last office visit on 08/27/23 patient's muscle mass has increased by 1.4 lb. Fat mass has decreased by 2.2 lb. Total body water has decreased by 2 lb.  Counseling done on how various foods will affect these numbers and how to maximize success  Total lbs lost to date: +2 lbs Total weight loss percentage to date: +0.91%  Carolyn Espinoza started the program on 09/25/2022. At the time she weighed 220 lbs; muscle mass was 117 lbs and fat mass was 96.8 lbs. Today, she is 222 lbs; muscle mass was 120.2 lbs and fat mass was 95.6 lbs.    Recommended Dietary Goals Carolyn Espinoza is currently in the action stage of change. As such, her goal is to continue weight management plan.  She has agreed to: follow Category 1 Meal Plan with B/L options and start journaling 1000 calories and 80+ g of protein per day.    Behavioral Intervention We discussed the following today: increasing lean protein intake to established goals, decreasing simple carbohydrates , increasing fiber rich foods, avoiding skipping meals, work on tracking and journaling calories using tracking application, decreasing eating out or consumption of processed foods, and making healthy choices when eating convenient foods, and avoiding temptations and identifying enticing environmental cues.   Additional resources provided today: Handout and personalized instruction on tracking and journaling using Apps or handwriting and using logs provided, handout on CAT 1 meal plan , Handout on CAT 1-2 breakfast options, and Handout on CAT 1-2 lunch options Meal plan handout with B/L options and journaling plan   Evidence-based interventions for health behavior  change were utilized today including the discussion of self monitoring techniques, problem-solving barriers and SMART goal setting techniques.   Regarding patient's less desirable eating habits and patterns, we employed the technique of small changes.   Pt will specifically work on: Walk 15 minutes every day and start journaling daily  for next visit.    Recommended Physical Activity Goals Carolyn Espinoza has been advised to work up to 150 minutes of moderate intensity aerobic activity a week and strengthening exercises 2-3 times per week for cardiovascular health, weight loss maintenance and preservation of muscle mass.   She has agreed to :  Think about enjoyable ways to increase daily physical activity and overcoming barriers to exercise, Increase physical activity in their day and reduce sedentary time (increase NEAT)., Increase the intensity, frequency or duration of aerobic exercises  , and Walk 15 minutes  every day.  Increase daily activity - walking   Pharmacotherapy We both agreed to: continue with nutritional and behavioral strategies and adequate clinical response to current dose, continue current regimen   FOR ASSOCIATED CONDITIONS ADDRESSED TODAY: Vitamin D deficiency Assessment & Plan: Lab Results  Component Value Date   VD25OH 25.9 (L) 08/06/2023   VD25OH 10.1 (L) 09/25/2022   Pt is on ERGO 50K units once weekly with good compliance, pt taking every Wednesday or Thursday. Tolerating well with no adverse SE reported.   Ideal Vit D levels of 50-70 reviewed with patient. Continue with current supplementation regimen as prescribed. Will refill ERGO, no dose changes. Will monitor condition and recheck Vit D levels in the next 1-2 months.   Orders: -  Refill Vitamin D, no dose changes   Follow up:   Return in about 4 weeks (around 11/06/2023) for 4-6 week follow up. Marland Kitchen She was informed of the importance of frequent follow up visits to maximize her success with intensive lifestyle  modifications for her multiple health conditions.  Subjective:   Chief complaint: Obesity Carolyn Espinoza is here to discuss her progress with her obesity treatment plan. She is on the Category 1 Plan with B/L options and journaling intake and states she is following her eating plan approximately 30% of the time. She states she is not exercising.   Interval History:  Carolyn Espinoza is here for a follow up office visit. Since last OV on 08/27/23, she is down 1 lb. Has been trying to carry her food with her and meal prep. Has been eating out fast food (subs, baked pork chop and cabbage). When trying to eat lean meat she tends to eat chicken breast and [place]. Has not been eating deli meats. For breakfast she has been eating yogurt and cereal. She doesn't enjoy eggs.   She recently had her colonoscopy and states she was encouraged to eat more fiber due to recent constipation.   Pharmacotherapy for weight loss: She is currently taking Bupropion (single agent, off label use) with adequate clinical response  and without side effects..   Review of Systems:  Pertinent positives were addressed with patient today.  Reviewed by clinician on day of visit: allergies, medications, problem list, medical history, surgical history, family history, social history, and previous encounter notes.  Weight Summary and Biometrics   Weight Lost Since Last Visit: 1 lb  Weight Gained Since Last Visit: 0    Vitals Temp: 97.7 F (36.5 C) BP: 131/86 Pulse Rate: 69 SpO2: 93 %   Anthropometric Measurements Height: 5\' 3"  (1.6 m) Weight: 222 lb (100.7 kg) BMI (Calculated): 39.34 Weight at Last Visit: 223 lb Weight Lost Since Last Visit: 1 lb Weight Gained Since Last Visit: 0 Starting Weight: 220 lb Total Weight Loss (lbs): 0 lb (0 kg)   Body Composition  Body Fat %: 43 % Fat Mass (lbs): 95.6 lbs Muscle Mass (lbs): 120.2 lbs Total Body Water (lbs): 75.6 lbs Visceral Fat Rating : 14   Other Clinical  Data Fasting: Yes Today's Visit #: 12 Starting Date: 09/25/22    Objective:   PHYSICAL EXAM: Blood pressure 131/86, pulse 69, temperature 97.7 F (36.5 C), height 5\' 3"  (1.6 m), weight 222 lb (100.7 kg), last menstrual period 02/11/2016, SpO2 93%. Body mass index is 39.33 kg/m.  General: she is overweight, cooperative and in no acute distress. PSYCH: Has normal mood, affect and thought process.   HEENT: EOMI, sclerae are anicteric. Lungs: Normal breathing effort, no conversational dyspnea. Extremities: Moves * 4 Neurologic: A and O * 3, good insight  DIAGNOSTIC DATA REVIEWED: BMET    Component Value Date/Time   NA 142 08/06/2023 0955   K 4.4 08/06/2023 0955   CL 101 08/06/2023 0955   CO2 24 08/06/2023 0955   GLUCOSE 113 (H) 08/06/2023 0955   GLUCOSE 116 (H) 04/28/2020 1358   BUN 10 08/06/2023 0955   CREATININE 0.74 08/06/2023 0955   CALCIUM 10.2 08/06/2023 0955   GFRNONAA >60 04/28/2020 1358   GFRAA 90 05/10/2019 0855   Lab Results  Component Value Date   HGBA1C 6.1 (H) 08/06/2023   HGBA1C 5.2 03/31/2018   Lab Results  Component Value Date   INSULIN 190.0 (H) 08/06/2023   INSULIN 18.6 09/25/2022  Lab Results  Component Value Date   TSH 0.671 09/25/2022   CBC    Component Value Date/Time   WBC 9.2 08/06/2023 0955   WBC 8.6 04/28/2020 1358   RBC 5.87 (H) 08/06/2023 0955   RBC 4.79 04/28/2020 1358   HGB 17.3 (H) 08/06/2023 0955   HCT 52.9 (H) 08/06/2023 0955   PLT 233 08/06/2023 0955   MCV 90 08/06/2023 0955   MCH 29.5 08/06/2023 0955   MCH 31.7 04/28/2020 1358   MCHC 32.7 08/06/2023 0955   MCHC 33.8 04/28/2020 1358   RDW 13.4 08/06/2023 0955   Iron Studies No results found for: "IRON", "TIBC", "FERRITIN", "IRONPCTSAT" Lipid Panel     Component Value Date/Time   CHOL 180 09/25/2022 1031   TRIG 85 09/25/2022 1031   HDL 55 09/25/2022 1031   CHOLHDL 3.2 03/01/2022 1113   CHOLHDL 3.3 Ratio 02/24/2009 2137   VLDL 29 02/24/2009 2137   LDLCALC  109 (H) 09/25/2022 1031   Hepatic Function Panel     Component Value Date/Time   PROT 7.9 08/06/2023 0955   ALBUMIN 4.2 08/06/2023 0955   AST 15 08/06/2023 0955   ALT 15 08/06/2023 0955   ALKPHOS 70 08/06/2023 0955   BILITOT 0.3 08/06/2023 0955      Component Value Date/Time   TSH 0.671 09/25/2022 1031   Nutritional Lab Results  Component Value Date   VD25OH 25.9 (L) 08/06/2023   VD25OH 10.1 (L) 09/25/2022    Attestations:   Burnett Sheng, acting as a medical scribe for Thomasene Lot, DO., have compiled all relevant documentation for today's office visit on behalf of Thomasene Lot, DO, while in the presence of [employer], DO.  Reviewed by clinician on day of visit: allergies, medications, problem list, medical history, surgical history, family history, social history, and previous encounter notes pertinent to patient's obesity diagnosis.  I have spent 40 minutes in the care of the patient today including: preparing to see patient (e.g. review and interpretation of tests, old notes ), obtaining and/or reviewing separately obtained history, performing a medically appropriate examination or evaluation, counseling and educating the patient, ordering medications, test or procedures, documenting clinical information in the electronic or other health care record, and independently interpreting results and communicating results to the patient, family, or caregiver   I have reviewed the above documentation for accuracy and completeness, and I agree with the above. Carolyn Espinoza, D.O.  The 21st Century Cures Act was signed into law in 2016 which includes the topic of electronic health records.  This provides immediate access to information in MyChart.  This includes consultation notes, operative notes, office notes, lab results and pathology reports.  If you have any questions about what you read please let us know at your next visit so we can discuss your concerns and take  corrective action if need be.  We are right here with you.

## 2023-10-16 ENCOUNTER — Ambulatory Visit (INDEPENDENT_AMBULATORY_CARE_PROVIDER_SITE_OTHER): Admitting: Primary Care

## 2023-11-20 ENCOUNTER — Ambulatory Visit (INDEPENDENT_AMBULATORY_CARE_PROVIDER_SITE_OTHER): Admitting: Family Medicine

## 2023-12-03 ENCOUNTER — Encounter (INDEPENDENT_AMBULATORY_CARE_PROVIDER_SITE_OTHER): Payer: Self-pay | Admitting: Family Medicine

## 2023-12-03 ENCOUNTER — Ambulatory Visit (INDEPENDENT_AMBULATORY_CARE_PROVIDER_SITE_OTHER): Admitting: Family Medicine

## 2023-12-03 VITALS — BP 109/73 | HR 69 | Temp 97.9°F | Ht 63.0 in | Wt 223.0 lb

## 2023-12-03 DIAGNOSIS — Z6839 Body mass index (BMI) 39.0-39.9, adult: Secondary | ICD-10-CM

## 2023-12-03 DIAGNOSIS — E669 Obesity, unspecified: Secondary | ICD-10-CM | POA: Diagnosis not present

## 2023-12-03 DIAGNOSIS — R7303 Prediabetes: Secondary | ICD-10-CM

## 2023-12-03 DIAGNOSIS — E559 Vitamin D deficiency, unspecified: Secondary | ICD-10-CM

## 2023-12-03 MED ORDER — VITAMIN D (ERGOCALCIFEROL) 1.25 MG (50000 UNIT) PO CAPS
50000.0000 [IU] | ORAL_CAPSULE | ORAL | 0 refills | Status: DC
Start: 2023-12-03 — End: 2024-05-04

## 2023-12-03 NOTE — Progress Notes (Signed)
 Carolyn Espinoza, D.O.  ABFM, ABOM Specializing in Clinical Bariatric Medicine  Office located at: [address] [place], [place]  [address]   Assessment and Plan:  No orders of the defined types were placed in this encounter.   Medications Discontinued During This Encounter  Medication Reason   Vitamin D , Ergocalciferol , (DRISDOL ) 1.25 MG (50000 UNIT) CAPS capsule Reorder     Meds ordered this encounter  Medications   Vitamin D , Ergocalciferol , (DRISDOL ) 1.25 MG (50000 UNIT) CAPS capsule    Sig: Take 1 capsule (50,000 Units total) by mouth every 7 (seven) days.    Dispense:  12 capsule    Refill:  0    Will obtain fasting labs (A1c, vit insulin , vit D, etc) at next OV.   FOR THE DISEASE OF OBESITY: BMI 39.0-39.9,adult - Current BMI 39.34 Obesity with starting BMI of 39.0/date 09/25/22 Assessment & Plan: Since last office visit on 10/09/23 patient's muscle mass has decreased by 2.8 lbs. Fat mass has increased by 4.4 lbs. Total body water has increased by 0.8 lbs.  Counseling done on how various foods will affect these numbers and how to maximize success  Total lbs lost to date: 2 lbs Total weight loss percentage to date: 0.91%    Recommended Dietary Goals Carolyn Espinoza is currently in the action stage of change. As such, her goal is to continue weight management plan.  She has agreed to: continue current plan   Behavioral Intervention We discussed the following today: increasing lean protein intake to established goals, decreasing simple carbohydrates , avoiding skipping meals, increasing water intake , and continue to work on maintaining a reduced calorie state, getting the recommended amount of protein, incorporating whole foods, making healthy choices, staying well hydrated and practicing mindfulness when eating.  Additional resources provided today: Handout on CAT 1 meal plan , Handout on CAT 1-2 breakfast options, and Handout on CAT 1-2 lunch options.   Evidence-based  interventions for health behavior change were utilized today including the discussion of self monitoring techniques, problem-solving barriers and SMART goal setting techniques.   Regarding patient's less desirable eating habits and patterns, we employed the technique of small changes.   Pt will specifically work on: getting back on track with her meal plan for next visit.    Recommended Physical Activity Goals Carolyn Espinoza has been advised to work up to 300-450 minutes of moderate intensity aerobic activity a week and strengthening exercises 2-3 times per week for cardiovascular health, weight loss maintenance and preservation of muscle mass.   She has agreed to :  Continue current level of physical activity  and Increase physical activity in their day and reduce sedentary time (increase NEAT).   Pharmacotherapy We both agreed to: continue with nutritional and behavioral strategies and continue with current med regimen.    ASSOCIATED CONDITIONS ADDRESSED TODAY: Prediabetes Assessment & Plan: Lab Results  Component Value Date   HGBA1C 6.1 (H) 08/06/2023   HGBA1C 6.0 (H) 09/25/2022   HGBA1C 5.6 10/17/2021   INSULIN  190.0 (H) 08/06/2023   INSULIN  18.6 09/25/2022    Not on any meds currently. Diet/exercise approach. Pt has been prioritizing her protein intake. No acute concerns today. Continue efforts to increase protein intake, decrease simple carbs/sugars, and gradually increase exercise. Will obtain fasting labs, A1c and insulin , at her next OV.    Vitamin D  deficiency Assessment & Plan: Lab Results  Component Value Date   VD25OH 25.9 (L) 08/06/2023   VD25OH 10.1 (L) 09/25/2022  Pt is taking ERGO 50K units once weekly, but notes she has not been taking as consistently as prescribed. She reports recently running out and ris requesting a refill today. Continue with current vit D supplementation. Will refill ERGO with  no dose changes made today. Will recheck vit D levels at next OV.    Orders: - Refill ERGO, no changes   Follow up:   Return in about 5 weeks (around 01/07/2024) for fasting labs at next OV at next available appt time. She was informed of the importance of frequent follow up visits to maximize her success with intensive lifestyle modifications for her multiple health conditions.  Subjective:   Chief complaint: Obesity Carolyn Espinoza is here to discuss her progress with her obesity treatment plan. She is on the Category 1 Plan with B/L options and start journaling 1000 calories and 80+ g of protein per day and states she is following her eating plan approximately 80% of the time. She states she is doing low impact exercising 12 minutes 4-5 days per week.   Interval History:  Carolyn Espinoza is here for a follow up office visit. Since last OV on 10/09/23, gained 1 lb. Has typically been eating tuna and chicken as her main protein sources with meals. Has been trying to eat more [place]; stating she is still working on this.  Reports having a "stomach bug" last week with associated gas.   Pharmacotherapy for weight loss: She is currently taking Bupropion  (single agent, off label use) with adequate clinical response  and without side effects..   Review of Systems:  Pertinent positives were addressed with patient today.  Reviewed by clinician on day of visit: allergies, medications, problem list, medical history, surgical history, family history, social history, and previous encounter notes.  Weight Summary and Biometrics   No data recorded   Objective:   PHYSICAL EXAM: Blood pressure 109/73, pulse 69, temperature 97.9 F (36.6 C), height 5\' 3"  (1.6 m), weight 223 lb (101.2 kg), last menstrual period 02/11/2016, SpO2 96%. Body mass index is 39.5 kg/m.  General: she is overweight, cooperative and in no acute distress. PSYCH: Has normal mood, affect and thought process.   HEENT: EOMI, sclerae are anicteric. Lungs: Normal breathing effort, no conversational  dyspnea. Extremities: Moves * 4 Neurologic: A and O * 3, good insight  DIAGNOSTIC DATA REVIEWED: BMET    Component Value Date/Time   NA 142 08/06/2023 0955   K 4.4 08/06/2023 0955   CL 101 08/06/2023 0955   CO2 24 08/06/2023 0955   GLUCOSE 113 (H) 08/06/2023 0955   GLUCOSE 116 (H) 04/28/2020 1358   BUN 10 08/06/2023 0955   CREATININE 0.74 08/06/2023 0955   CALCIUM 10.2 08/06/2023 0955   GFRNONAA >60 04/28/2020 1358   GFRAA 90 05/10/2019 0855   Lab Results  Component Value Date   HGBA1C 6.1 (H) 08/06/2023   HGBA1C 5.2 03/31/2018   Lab Results  Component Value Date   INSULIN  190.0 (H) 08/06/2023   INSULIN  18.6 09/25/2022   Lab Results  Component Value Date   TSH 0.671 09/25/2022   CBC    Component Value Date/Time   WBC 9.2 08/06/2023 0955   WBC 8.6 04/28/2020 1358   RBC 5.87 (H) 08/06/2023 0955   RBC 4.79 04/28/2020 1358   HGB 17.3 (H) 08/06/2023 0955   HCT 52.9 (H) 08/06/2023 0955   PLT 233 08/06/2023 0955   MCV 90 08/06/2023 0955   MCH 29.5 08/06/2023 0955   MCH 31.7 04/28/2020  1358   MCHC 32.7 08/06/2023 0955   MCHC 33.8 04/28/2020 1358   RDW 13.4 08/06/2023 0955   Iron Studies No results found for: "IRON", "TIBC", "FERRITIN", "IRONPCTSAT" Lipid Panel     Component Value Date/Time   CHOL 180 09/25/2022 1031   TRIG 85 09/25/2022 1031   HDL 55 09/25/2022 1031   CHOLHDL 3.2 03/01/2022 1113   CHOLHDL 3.3 Ratio 02/24/2009 2137   VLDL 29 02/24/2009 2137   LDLCALC 109 (H) 09/25/2022 1031   Hepatic Function Panel     Component Value Date/Time   PROT 7.9 08/06/2023 0955   ALBUMIN 4.2 08/06/2023 0955   AST 15 08/06/2023 0955   ALT 15 08/06/2023 0955   ALKPHOS 70 08/06/2023 0955   BILITOT 0.3 08/06/2023 0955      Component Value Date/Time   TSH 0.671 09/25/2022 1031   Nutritional Lab Results  Component Value Date   VD25OH 25.9 (L) 08/06/2023   VD25OH 10.1 (L) 09/25/2022    Attestations:   Cherryl Corona, acting as a medical scribe for  Marceil Sensor, DO., have compiled all relevant documentation for today's office visit on behalf of Marceil Sensor, DO, while in the presence of [employer], DO.  Reviewed by clinician on day of visit: allergies, medications, problem list, medical history, surgical history, family history, social history, and previous encounter notes pertinent to patient's obesity diagnosis.  I have reviewed the above documentation for accuracy and completeness, and I agree with the above. Carolyn Espinoza, D.O.  The 21st Century Cures Act was signed into law in 2016 which includes the topic of electronic health records.  This provides immediate access to information in MyChart.  This includes consultation notes, operative notes, office notes, lab results and pathology reports.  If you have any questions about what you read please let us  know at your next visit so we can discuss your concerns and take corrective action if need be.  We are right here with you.

## 2023-12-23 DIAGNOSIS — R32 Unspecified urinary incontinence: Secondary | ICD-10-CM | POA: Diagnosis not present

## 2023-12-23 DIAGNOSIS — I10 Essential (primary) hypertension: Secondary | ICD-10-CM | POA: Diagnosis not present

## 2024-01-14 ENCOUNTER — Encounter (INDEPENDENT_AMBULATORY_CARE_PROVIDER_SITE_OTHER): Payer: Self-pay

## 2024-01-14 ENCOUNTER — Ambulatory Visit (INDEPENDENT_AMBULATORY_CARE_PROVIDER_SITE_OTHER): Admitting: Family Medicine

## 2024-01-14 DIAGNOSIS — Z91199 Patient's noncompliance with other medical treatment and regimen due to unspecified reason: Secondary | ICD-10-CM

## 2024-01-18 NOTE — Progress Notes (Signed)
 No show

## 2024-02-03 ENCOUNTER — Emergency Department (HOSPITAL_COMMUNITY)
Admission: EM | Admit: 2024-02-03 | Discharge: 2024-02-04 | Disposition: A | Discharge status: Home / Self Care | Attending: Emergency Medicine | Admitting: Emergency Medicine

## 2024-02-03 DIAGNOSIS — L299 Pruritus, unspecified: Secondary | ICD-10-CM | POA: Insufficient documentation

## 2024-02-03 DIAGNOSIS — Z7951 Long term (current) use of inhaled steroids: Secondary | ICD-10-CM | POA: Insufficient documentation

## 2024-02-03 DIAGNOSIS — I10 Essential (primary) hypertension: Secondary | ICD-10-CM | POA: Insufficient documentation

## 2024-02-03 DIAGNOSIS — M25572 Pain in left ankle and joints of left foot: Secondary | ICD-10-CM | POA: Insufficient documentation

## 2024-02-03 DIAGNOSIS — F17200 Nicotine dependence, unspecified, uncomplicated: Secondary | ICD-10-CM | POA: Insufficient documentation

## 2024-02-03 DIAGNOSIS — J45909 Unspecified asthma, uncomplicated: Secondary | ICD-10-CM | POA: Insufficient documentation

## 2024-02-03 DIAGNOSIS — Z79899 Other long term (current) drug therapy: Secondary | ICD-10-CM | POA: Insufficient documentation

## 2024-02-04 ENCOUNTER — Emergency Department (HOSPITAL_COMMUNITY)

## 2024-02-04 ENCOUNTER — Encounter (HOSPITAL_COMMUNITY): Payer: Self-pay | Admitting: Emergency Medicine

## 2024-02-04 ENCOUNTER — Other Ambulatory Visit: Payer: Self-pay

## 2024-02-04 DIAGNOSIS — L299 Pruritus, unspecified: Secondary | ICD-10-CM | POA: Diagnosis not present

## 2024-02-04 NOTE — ED Triage Notes (Addendum)
 Pt in with L ankle pain and swelling, painful when walking, symptoms started today. Pt denies any known injury

## 2024-02-04 NOTE — ED Notes (Signed)
 Patient d/c with home care instructions. VS obtained.

## 2024-02-04 NOTE — ED Provider Notes (Signed)
 [place] [facility]ERGENC[facility] DEPARTMENT AT [facility] Provider Note  CSN: [id number] Arrival date & time: 02/03/24 7668  Chief Complaint(s) Ankle Pain  HPI Carolyn Espinoza is a 58 y.o. female here for left ankle discomfort.  She describes it as aching/itching.  Unsure if she was bit by anything in.  Improved with scratching and squeezing the medial ankle.  Patient denies any fall or trauma.  Noted bilateral lower extremity swelling.  Denies any shortness of breath.  No chest pain.  No recent fevers or infections.  No cough or congestion.  The history is provided by the patient.    Past Medical History Past Medical History:  Diagnosis Date   Asthma    Back pain    Constipation    HTN (hypertension) 07/22/2010   Patient Active Problem List   Diagnosis Date Noted   Adjustment disorder with depressed mood- care giver stress + emotional eating 11/07/2022   Tobacco use disorder 11/07/2022   Tobacco abuse counseling 11/07/2022   BMI 38.0-38.9,adult 10/24/2022   Obesity with starting BMI of 39.0 10/24/2022   Vitamin D  deficiency 10/24/2022   Essential hypertension 10/24/2022   Prediabetes 10/24/2022   CHALAZION, LEFT 10/06/2009   FURUNCLE 05/16/2009   PERIMENOPAUSAL SYNDROME 01/31/2009   CARPAL TUNNEL SYNDROME, RIGHT 10/20/2008   ULNAR NEUROPATHY, RIGHT 10/20/2008   TROCHANTERIC BURSITIS, RIGHT 10/20/2008   ELEVATED BLOOD PRESSURE WITHOUT DIAGNOSIS OF HYPERTENSION 10/20/2008   FIBROIDS, UTERUS 07/22/1993   Home Medication(s) Prior to Admission medications   Medication Sig Start Date End Date Taking? Authorizing Provider  albuterol  (VENTOLIN  HFA) 108 (90 Base) MCG/ACT inhaler Inhale 2 puffs into the lungs every 4 (four) hours as needed for wheezing or shortness of breath. 07/08/23   Celestia Rosaline SQUIBB, NP  amLODipine  (NORVASC ) 10 MG tablet Take 1 tablet daily 07/08/23   Celestia Rosaline SQUIBB, NP  buPROPion  (WELLBUTRIN  XL) 150 MG 24 hr tablet Take 1 tablet (150 mg total) by mouth  every morning. 07/08/23   Celestia Rosaline SQUIBB, NP  cyanocobalamin  (VITAMIN B12) 500 MCG tablet Take 1 tablet (500 mcg total) by mouth daily. 08/27/23   Midge Sober, DO  lisinopril -hydrochlorothiazide  (ZESTORETIC ) 20-12.5 MG tablet Take 2 tablets by mouth daily. 07/08/23   Celestia Rosaline SQUIBB, NP  Multiple Vitamin (MULTIVITAMIN WITH MINERALS) TABS tablet Take 1 tablet by mouth daily.    [provider]  triamcinolone  cream (KENALOG ) 0.1 % Apply 1 Application topically 2 (two) times daily. 01/28/22   Crain, Whitney L, PA  Vitamin D , Ergocalciferol , (DRISDOL ) 1.25 MG (50000 UNIT) CAPS capsule Take 1 capsule (50,000 Units total) by mouth every 7 (seven) days. 12/03/23   Midge Sober, DO                                                                                                                                    Allergies Patient has no known allergies.  Review of Systems Review of  Systems As noted in HPI  Physical Exam Vital Signs  I have reviewed the triage vital signs BP (!) 153/98 (BP Location: Right Arm)   Pulse 87   Temp 97.9 F (36.6 C)   Resp 17   Wt 101.2 kg   LMP 02/11/2016   SpO2 94%   BMI 39.52 kg/m   Physical Exam Vitals reviewed.  Constitutional:      General: She is not in acute distress.    Appearance: She is well-developed. She is not diaphoretic.  HENT:     Head: Normocephalic and atraumatic.     Right Ear: External ear normal.     Left Ear: External ear normal.     Nose: Nose normal.  Eyes:     General: No scleral icterus.    Conjunctiva/sclera: Conjunctivae normal.  Neck:     Trachea: Phonation normal.  Cardiovascular:     Rate and Rhythm: Normal rate and regular rhythm.  Pulmonary:     Effort: Pulmonary effort is normal. No respiratory distress.     Breath sounds: No stridor.  Abdominal:     General: There is no distension.  Musculoskeletal:        General: Normal range of motion.     Cervical back: Normal range of motion.      Right ankle: Swelling present.     Left ankle: Swelling present.       Feet:  Neurological:     Mental Status: She is alert and oriented to person, place, and time.  Psychiatric:        Behavior: Behavior normal.     ED Results and Treatments Labs (all labs ordered are listed, but only abnormal results are displayed) Labs Reviewed - No data to display                                                                                                                       EKG  EKG Interpretation Date/Time:    Ventricular Rate:    PR Interval:    QRS Duration:    QT Interval:    QTC Calculation:   R Axis:      Text Interpretation:         Radiology DG Ankle Complete Left Result Date: 02/04/2024 CLINICAL DATA:  Left ankle pain and swelling for 1 day, initial encounter EXAM: LEFT ANKLE COMPLETE - 3+ VIEW COMPARISON:  None Available. FINDINGS: Generalized soft tissue swelling is noted about the ankle joint. No acute fracture or dislocation is noted. Small calcaneal spur is noted. IMPRESSION: Generalized soft tissue swelling without acute bony abnormality. Electronically Signed   By: Oneil Devonshire M.D.   On: 02/04/2024 00:29    Medications Ordered in ED Medications - No data to display Procedures Procedures  (including critical care time) Medical Decision Making / ED Course   Medical Decision Making Amount and/or Complexity of Data Reviewed Radiology: ordered.    Left ankle discomfort. Patient denies any trauma.  X-ray negative for acute  injuries. Does not appear to be consistent with cellulitis.  Doubt DVT. Not consistent with arterial occlusion Possible contact dermatitis versus pruritus from insect bite.    Final Clinical Impression(s) / ED Diagnoses Final diagnoses:  Itching   The patient appears reasonably screened and/or stabilized for discharge and I doubt any other medical condition or other [facility] requiring further screening, evaluation, or treatment in the ED at  this time. I have discussed the findings, Dx and Tx plan with the patient/family who expressed understanding and agree(s) with the plan. Discharge instructions discussed at length. The patient/family was given strict return precautions who verbalized understanding of the instructions. No further questions at time of discharge.  Disposition: Discharge  Condition: Good  ED Discharge Orders     None       Follow Up: Celestia Rosaline SQUIBB, NP [id number] [place] [address] [phone]  Call  to schedule an appointment for close follow up    This chart was dictated using voice recognition software.  Despite best efforts to proofread,  errors can occur which can change the documentation meaning.    Trine Raynell Moder, MD 02/04/24 [phone]

## 2024-02-26 ENCOUNTER — Other Ambulatory Visit: Payer: Self-pay

## 2024-02-26 ENCOUNTER — Ambulatory Visit (HOSPITAL_COMMUNITY)
Admission: EM | Admit: 2024-02-26 | Discharge: 2024-02-26 | Disposition: A | Discharge status: Home / Self Care | Attending: Family Medicine | Admitting: Family Medicine

## 2024-02-26 ENCOUNTER — Encounter (HOSPITAL_COMMUNITY): Payer: Self-pay

## 2024-02-26 ENCOUNTER — Encounter (HOSPITAL_COMMUNITY): Payer: Self-pay | Admitting: *Deleted

## 2024-02-26 ENCOUNTER — Ambulatory Visit (INDEPENDENT_AMBULATORY_CARE_PROVIDER_SITE_OTHER)

## 2024-02-26 ENCOUNTER — Encounter (INDEPENDENT_AMBULATORY_CARE_PROVIDER_SITE_OTHER): Payer: Self-pay | Admitting: Primary Care

## 2024-02-26 ENCOUNTER — Ambulatory Visit (INDEPENDENT_AMBULATORY_CARE_PROVIDER_SITE_OTHER): Admitting: Primary Care

## 2024-02-26 ENCOUNTER — Ambulatory Visit (HOSPITAL_COMMUNITY): Payer: Self-pay | Admitting: Family Medicine

## 2024-02-26 VITALS — BP 138/82 | HR 71 | Resp 20 | Ht 63.0 in | Wt 227.0 lb

## 2024-02-26 DIAGNOSIS — M79671 Pain in right foot: Secondary | ICD-10-CM | POA: Diagnosis not present

## 2024-02-26 DIAGNOSIS — R350 Frequency of micturition: Secondary | ICD-10-CM

## 2024-02-26 DIAGNOSIS — M109 Gout, unspecified: Secondary | ICD-10-CM | POA: Insufficient documentation

## 2024-02-26 DIAGNOSIS — N3000 Acute cystitis without hematuria: Secondary | ICD-10-CM

## 2024-02-26 LAB — CBC
HCT: 52.3 % — ABNORMAL HIGH (ref 36.0–46.0)
Hemoglobin: 17.1 g/dL — ABNORMAL HIGH (ref 12.0–15.0)
MCH: 29.6 pg (ref 26.0–34.0)
MCHC: 32.7 g/dL (ref 30.0–36.0)
MCV: 90.5 fL (ref 80.0–100.0)
Platelets: 240 K/uL (ref 150–400)
RBC: 5.78 MIL/uL — ABNORMAL HIGH (ref 3.87–5.11)
RDW: 15.2 % (ref 11.5–15.5)
WBC: 8.5 K/uL (ref 4.0–10.5)
nRBC: 0 % (ref 0.0–0.2)

## 2024-02-26 LAB — POCT URINALYSIS DIP (CLINITEK)
Bilirubin, UA: NEGATIVE
Blood, UA: NEGATIVE
Glucose, UA: NEGATIVE mg/dL
Ketones, POC UA: NEGATIVE mg/dL
Nitrite, UA: NEGATIVE
POC PROTEIN,UA: 30 — AB
Spec Grav, UA: 1.025 (ref 1.010–1.025)
Urobilinogen, UA: 1 U/dL
pH, UA: 5.5 (ref 5.0–8.0)

## 2024-02-26 LAB — URIC ACID: Uric Acid, Serum: 8.4 mg/dL — ABNORMAL HIGH (ref 2.5–7.1)

## 2024-02-26 LAB — BASIC METABOLIC PANEL WITH GFR
Anion gap: 10 (ref 5–15)
BUN: 10 mg/dL (ref 6–20)
CO2: 26 mmol/L (ref 22–32)
Calcium: 9.6 mg/dL (ref 8.9–10.3)
Chloride: 103 mmol/L (ref 98–111)
Creatinine, Ser: 0.92 mg/dL (ref 0.44–1.00)
GFR, Estimated: >60 mL/min (ref >60)
Glucose, Bld: 78 mg/dL (ref 70–99)
Potassium: 3.9 mmol/L (ref 3.5–5.1)
Sodium: 139 mmol/L (ref 135–145)

## 2024-02-26 MED ORDER — CIPROFLOXACIN HCL 500 MG PO TABS: 500.0000 mg | ORAL_TABLET | Freq: Two times a day (BID) | ORAL | 0 refills | Status: AC

## 2024-02-26 MED ORDER — KETOROLAC TROMETHAMINE 30 MG/ML IJ SOLN
30.0000 mg | Freq: Once | INTRAMUSCULAR | Status: AC
  Administered 2024-02-26: 30 mg via INTRAMUSCULAR

## 2024-02-26 MED ORDER — CEPHALEXIN 500 MG PO CAPS: 500.0000 mg | ORAL_CAPSULE | Freq: Two times a day (BID) | ORAL | 0 refills | Status: AC

## 2024-02-26 MED ORDER — KETOROLAC TROMETHAMINE 30 MG/ML IJ SOLN
INTRAMUSCULAR | Status: AC
  Filled 2024-02-26: qty 1

## 2024-02-26 MED ORDER — COLCHICINE 0.6 MG PO TABS: 0.6000 mg | ORAL_TABLET | Freq: Every day | ORAL | 0 refills | Status: AC | PRN

## 2024-02-26 MED ORDER — PREDNISONE 20 MG PO TABS: 40.0000 mg | ORAL_TABLET | Freq: Every day | ORAL | 0 refills | Status: AC

## 2024-02-26 NOTE — ED Triage Notes (Signed)
 PT reports RT foot pain started on Tuesday . Pt has swelling,redness and pain to the RT foot today. Pt took Aleve that did not relieve the pain or swelling. Pt limping when walking to room. Pt's LT foot had same Sx's last week. Pt denies any injury.

## 2024-02-26 NOTE — Progress Notes (Signed)
 Per provider , medication will be sent.

## 2024-02-26 NOTE — ED Provider Notes (Signed)
 [facility]    CSN: [id number] Arrival date & time: 02/26/24  0806      History   Chief Complaint Chief Complaint  Patient presents with   Foot Pain    HPI Carolyn Espinoza is a 58 y.o. female.    Foot Pain  Here for pain in her right distal foot.  It began bothering her on August fifth.  It is a little red and a little puffy around the first MTP and across the other MTPs.  No trauma or fall  Last week her left foot felt similarly but that resolved on its own.  No history of diabetes and she is never known to have a history of gout.  She has occasionally felt a little chilled, but no documented fever.  No upper respiratory symptoms.  Past medical history is significant for asthma.  She feels short of breath sometimes when she is exerting herself, but feels that her inhaler helps.  Her asthma is not really worse lately.  NKDA  Last menstrual cycle was 2017  On review of the chart her last sugar was 113 and her A1c was 6.1, indicating prediabetes. Last EGFR was 93  Past Medical History:  Diagnosis Date   Asthma    Back pain    Constipation    HTN (hypertension) 07/22/2010    Patient Active Problem List   Diagnosis Date Noted   Adjustment disorder with depressed mood- care giver stress + emotional eating 11/07/2022   Tobacco use disorder 11/07/2022   Tobacco abuse counseling 11/07/2022   BMI 38.0-38.9,adult 10/24/2022   Obesity with starting BMI of 39.0 10/24/2022   Vitamin D  deficiency 10/24/2022   Essential hypertension 10/24/2022   Prediabetes 10/24/2022   CHALAZION, LEFT 10/06/2009   FURUNCLE 05/16/2009   PERIMENOPAUSAL SYNDROME 01/31/2009   CARPAL TUNNEL SYNDROME, RIGHT 10/20/2008   ULNAR NEUROPATHY, RIGHT 10/20/2008   TROCHANTERIC BURSITIS, RIGHT 10/20/2008   ELEVATED BLOOD PRESSURE WITHOUT DIAGNOSIS OF HYPERTENSION 10/20/2008   FIBROIDS, UTERUS 07/22/1993    Past Surgical History:  Procedure Laterality Date   COLONOSCOPY     DENTAL  SURGERY  2003   multiple teeth removed   POLYPECTOMY      OB History     Gravida  0   Para  0   Term  0   Preterm  0   AB  0   Living  0      SAB  0   IAB  0   Ectopic  0   Multiple  0   Live Births  0            Home Medications    Prior to Admission medications   Medication Sig Start Date End Date Taking? Authorizing Provider  albuterol  (VENTOLIN  HFA) 108 (90 Base) MCG/ACT inhaler Inhale 2 puffs into the lungs every 4 (four) hours as needed for wheezing or shortness of breath. 07/08/23  Yes Celestia Rosaline SQUIBB, NP  amLODipine  (NORVASC ) 10 MG tablet Take 1 tablet daily 07/08/23  Yes Celestia Rosaline SQUIBB, NP  cephALEXin  (KEFLEX ) 500 MG capsule Take 1 capsule (500 mg total) by mouth 2 (two) times daily for 7 days. 02/26/24 03/04/24 Yes Vonna Sharlet POUR, MD  colchicine  0.6 MG tablet Take 1 tablet (0.6 mg total) by mouth daily as needed (gout pain). 02/26/24  Yes Vonna Sharlet POUR, MD  cyanocobalamin  (VITAMIN B12) 500 MCG tablet Take 1 tablet (500 mcg total) by mouth daily. 08/27/23  Yes Midge Sober, DO  lisinopril -hydrochlorothiazide  (ZESTORETIC ) 20-12.5 MG tablet Take 2 tablets by mouth daily. 07/08/23  Yes Celestia Rosaline SQUIBB, NP  Multiple Vitamin (MULTIVITAMIN WITH MINERALS) TABS tablet Take 1 tablet by mouth daily.   Yes [provider]  predniSONE  (DELTASONE ) 20 MG tablet Take 2 tablets (40 mg total) by mouth daily with breakfast for 5 days. 02/26/24 03/02/24 Yes Sai Moura, Sharlet POUR, MD  Vitamin D , Ergocalciferol , (DRISDOL ) 1.25 MG (50000 UNIT) CAPS capsule Take 1 capsule (50,000 Units total) by mouth every 7 (seven) days. 12/03/23  Yes Opalski, Barnie, DO  buPROPion  (WELLBUTRIN  XL) 150 MG 24 hr tablet Take 1 tablet (150 mg total) by mouth every morning. 07/08/23   Celestia Rosaline SQUIBB, NP    Family History Family History  Problem Relation Age of Onset   Heart failure Mother    Diabetes Mother    Hypertension Mother    Kidney disease Mother    Heart  disease Mother    Cancer Father    Hypertension Father    Prostate cancer Father    COPD Father    Colon polyps Sister    Colon cancer Neg Hx    Esophageal cancer Neg Hx    Stomach cancer Neg Hx    Rectal cancer Neg Hx     Social History Social History   Tobacco Use   Smoking status: Every Day    Current packs/day: 0.50    Average packs/day: 0.5 packs/day for 30.0 years (15.0 ttl pk-yrs)    Types: Cigarettes   Smokeless tobacco: Never  Vaping Use   Vaping status: Never Used  Substance Use Topics   Alcohol use: Yes    Comment: occasional   Drug use: No     Allergies   Patient has no known allergies.   Review of Systems Review of Systems   Physical Exam Triage Vital Signs ED Triage Vitals  Encounter Vitals Group     BP 02/26/24 0832 (!) 129/90     Girls Systolic BP Percentile --      Girls Diastolic BP Percentile --      Boys Systolic BP Percentile --      Boys Diastolic BP Percentile --      Pulse Rate 02/26/24 0832 72     Resp 02/26/24 0832 20     Temp 02/26/24 0832 97.7 F (36.5 C)     Temp src --      SpO2 02/26/24 0832 91 %     Weight --      Height --      Head Circumference --      Peak Flow --      Pain Score 02/26/24 0830 9     Pain Loc --      Pain Education --      Exclude from Growth Chart --    No data found.  Updated Vital Signs BP (!) 129/90   Pulse 72   Temp 97.7 F (36.5 C)   Resp 20   LMP 02/11/2016   SpO2 91%   Visual Acuity Right Eye Distance:   Left Eye Distance:   Bilateral Distance:    Right Eye Near:   Left Eye Near:    Bilateral Near:     Physical Exam Vitals reviewed.  Constitutional:      General: She is not in acute distress.    Appearance: She is not toxic-appearing.  HENT:     Mouth/Throat:     Mouth: Mucous membranes are moist.  Cardiovascular:  Rate and Rhythm: Normal rate and regular rhythm.     Heart sounds: No murmur heard. Pulmonary:     Effort: No respiratory distress.     Breath  sounds: No stridor. No wheezing, rhonchi or rales.  Musculoskeletal:     Comments: There is some erythema and mild swelling at the first MTP and across her distal foot adjacent to the toes.  There is no fluctuance.  Capillary refill is normal and pulses are normal in the foot.  Skin:    Coloration: Skin is not jaundiced or pale.  Neurological:     General: No focal deficit present.     Mental Status: She is alert and oriented to person, place, and time.  Psychiatric:        Behavior: Behavior normal.      UC Treatments / Results  Labs (all labs ordered are listed, but only abnormal results are displayed) Labs Reviewed  CBC  BASIC METABOLIC PANEL WITH GFR  URIC ACID    EKG   Radiology No results found.  Procedures Procedures (including critical care time)  Medications Ordered in UC Medications  ketorolac  (TORADOL ) 30 MG/ML injection 30 mg (30 mg Intramuscular Given 02/26/24 0906)    Initial Impression / Assessment and Plan / UC Course  I have reviewed the triage vital signs and the nursing notes.  Pertinent labs & imaging results that were available during my care of the patient were reviewed by me and considered in my medical decision making (see chart for details).     With her having had similar symptoms that resolved spontaneously on her other foot last week, I am suspicious that this is a gout attack.  Still, with her not ever having had gout before, we are going to draw some labs to check her white count and uric acid.  Also I am going to cover for possible cellulitis with cephalexin  in addition to the prednisone  that is for the gout exacerbation.  Colchicine  is also sent in.  I discussed with her that even during a gout attack the uric acid may be normal. Final Clinical Impressions(s) / UC Diagnoses   Final diagnoses:  Foot pain, right  Exacerbation of gout     Discharge Instructions      On my review there are no acute bony abnormalities on your  x-rays.  The radiologist will also read your x-ray, and if their interpretation differs significantly from mine, and the management of your condition would change, we will call you.  You have been given a shot of Toradol  30 mg today.  Take prednisone  20 mg--2 daily for 5 days; this is for inflammation in your foot  Cephalexin  500 mg --1 tablet by mouth 2 times daily for 7 days.  This is for potential infection in your foot  Colchicine  0.6 mg--1 tablet daily as needed for gout pain  We have drawn blood to check your blood counts, electrolytes, and uric acid.  Staff will notify you if anything is significantly abnormal.  Please follow-up with your primary care about this issue.    ED Prescriptions     Medication Sig Dispense Auth. Provider   cephALEXin  (KEFLEX ) 500 MG capsule Take 1 capsule (500 mg total) by mouth 2 (two) times daily for 7 days. 14 capsule Lamel Mccarley K, MD   predniSONE  (DELTASONE ) 20 MG tablet Take 2 tablets (40 mg total) by mouth daily with breakfast for 5 days. 10 tablet Vonna Sharlet POUR, MD   colchicine  0.6 MG  tablet Take 1 tablet (0.6 mg total) by mouth daily as needed (gout pain). 15 tablet Kleber Crean K, MD      PDMP not reviewed this encounter.   Vonna Sharlet POUR, MD 02/26/24 [phone]

## 2024-02-26 NOTE — Discharge Instructions (Addendum)
 On my review there are no acute bony abnormalities on your x-rays.  The radiologist will also read your x-ray, and if their interpretation differs significantly from mine, and the management of your condition would change, we will call you.  You have been given a shot of Toradol  30 mg today.  Take prednisone  20 mg--2 daily for 5 days; this is for inflammation in your foot  Cephalexin  500 mg --1 tablet by mouth 2 times daily for 7 days.  This is for potential infection in your foot  Colchicine  0.6 mg--1 tablet daily as needed for gout pain  We have drawn blood to check your blood counts, electrolytes, and uric acid.  Staff will notify you if anything is significantly abnormal.  Please follow-up with your primary care about this issue.

## 2024-05-04 ENCOUNTER — Encounter (INDEPENDENT_AMBULATORY_CARE_PROVIDER_SITE_OTHER): Payer: Self-pay | Admitting: Family Medicine

## 2024-05-04 ENCOUNTER — Ambulatory Visit (INDEPENDENT_AMBULATORY_CARE_PROVIDER_SITE_OTHER): Admitting: Family Medicine

## 2024-05-04 VITALS — BP 132/86 | HR 73 | Temp 98.0°F | Ht 63.0 in | Wt 223.0 lb

## 2024-05-04 DIAGNOSIS — I10 Essential (primary) hypertension: Secondary | ICD-10-CM | POA: Diagnosis not present

## 2024-05-04 DIAGNOSIS — F17210 Nicotine dependence, cigarettes, uncomplicated: Secondary | ICD-10-CM

## 2024-05-04 DIAGNOSIS — F439 Reaction to severe stress, unspecified: Secondary | ICD-10-CM

## 2024-05-04 DIAGNOSIS — E559 Vitamin D deficiency, unspecified: Secondary | ICD-10-CM

## 2024-05-04 DIAGNOSIS — F17200 Nicotine dependence, unspecified, uncomplicated: Secondary | ICD-10-CM

## 2024-05-04 DIAGNOSIS — R7303 Prediabetes: Secondary | ICD-10-CM

## 2024-05-04 DIAGNOSIS — Z6839 Body mass index (BMI) 39.0-39.9, adult: Secondary | ICD-10-CM

## 2024-05-04 DIAGNOSIS — F4321 Adjustment disorder with depressed mood: Secondary | ICD-10-CM

## 2024-05-04 DIAGNOSIS — E669 Obesity, unspecified: Secondary | ICD-10-CM

## 2024-05-04 MED ORDER — VITAMIN D (ERGOCALCIFEROL) 1.25 MG (50000 UNIT) PO CAPS: 50000.0000 [IU] | ORAL_CAPSULE | ORAL | 0 refills | Status: AC

## 2024-05-04 MED ORDER — BUPROPION HCL ER (XL) 150 MG PO TB24: 150.0000 mg | ORAL_TABLET | ORAL | 0 refills | Status: AC

## 2024-05-04 NOTE — Progress Notes (Signed)
 Carolyn Espinoza, D.O.  ABFM, ABOM Specializing in Clinical Bariatric Medicine  Office located at: [address] [place], [place]  [address]    Review labs at next OV.   A) FOR THE CHRONIC DISEASE OF OBESITY:  Chief complaint: Obesity Carolyn Espinoza is here to discuss her progress with her obesity treatment plan.   History of present illness / Interval history:  Carolyn Espinoza is here today for her follow-up office visit.  Since last OV on 12/03/23, pt is the same way. Pt states that she has recently had a cold but is getting over it. She endorses trying to stop smoking but it still smoking.     12/03/23 14:00 05/04/24 08:00   Body Fat % 44.7 % 44.1 %  Muscle Mass (lbs) 117.4 lbs 118.4 lbs  Fat Mass (lbs) 100 lbs 98.4 lbs  Total Body Water (lbs) 76.2 lbs 74.8 lbs  Visceral Fat Rating  14 14    Counseling done on how various foods will affect these numbers and how to maximize success   Total lbs lost to date: + 3 lbs  Total Fat Mass in lbs lost to date: + 1.6 lbs Total weight loss percentage to date: + 1.36 %    BMI 39.0-39.9,adult - Current BMI 39.51 Obesity with starting BMI of 39.0/date 09/25/22  Nutrition Therapy She is on the Category 1 Plan with B/L options and start journaling 1000 calories and 80+ g of protein per day and states she is following her eating plan approximately 90 % of the time.   - Tracking Calories/Macros: yes  - Eating More Whole Foods: yes  - Adequate Protein Intake: yes  - Adequate Water Intake: yes  - Skipping Meals: no   - Sleeping 7-9 Hours/ Night: yes   [name] is currently in the action stage of change. As such, her goal is to continue weight management plan.  She has agreed to: continue current plan   Physical Activity [name]  is doing the row machine 20  minutes 3 days per week   [name] has been advised to work up to 300-450 minutes of moderate intensity aerobic activity a week and strengthening exercises 2-3 times per week for  cardiovascular health, weight loss maintenance and preservation of muscle mass.  She has agreed to : Increase physical activity in their day and reduce sedentary time (increase NEAT)., Increase volume of physical activity to a goal of 240 minutes a week, and Combine aerobic and strengthening exercises for efficiency and improved cardiometabolic health.   Behavioral Modifications Evidence-based interventions for health behavior change were utilized today including the discussion of  1) self monitoring techniques:    - Be Consistent  2) problem-solving barriers:    3) self care:    4) SMART goals for next OV:    - Move 20 to 30 min everyday  Regarding patient's less desirable eating habits and patterns, we employed the technique of small changes.   We discussed the following today: increasing lean protein intake to established goals, keeping healthy foods at home, better snacking choices, and focusing on food with a 10:1 ratio of calories: grams of protein, extensive discussion on alternative healthy options for rice,   Additional resources provided today: Handout on CAT 1 meal plan , Handout on CAT 1-2 breakfast options, and Handout on CAT 1-2 lunch options, Handout on Grocery List   Medical Interventions/ Pharmacotherapy Previous Bariatric surgery: n/a Pharmacotherapy for weight loss: She is not currently taking medications  for medical weight loss.     We discussed various medication options to help Carolyn Espinoza with her weight loss efforts and we both agreed to : Continue with current nutritional and behavioral strategies   B) OBESITY RELATED CONDITIONS ADDRESSED TODAY:  Prediabetes Assessment & Plan Lab Results  Component Value Date   HGBA1C 6.1 (H) 08/06/2023   HGBA1C 6.0 (H) 09/25/2022   HGBA1C 5.6 10/17/2021   INSULIN  190.0 (H) 08/06/2023   INSULIN  18.6 09/25/2022    Pt is currently not on any medications. Diet and lifestyle controlled. Pt states that she has good control  over hunger and cravings. Discussed with pt the benefits of Metformin and how it can help slow the progression of prediabetes to diabetes. Pt states that she wants to do her weight loss journey naturally but will think about Metformin. Continue to decrease simple carbs/ sugars; increase fiber and proteins. Increase exercise as tolerated.     Vitamin D  deficiency Assessment & Plan Lab Results  Component Value Date   VD25OH 25.9 (L) 08/06/2023   VD25OH 10.1 (L) 09/25/2022   Pt reports not taking her ERGO 50K units supplementation since September 1st. Last labs show that she had low Vit D. Low Vitamin D  levels may be linked to an increased risk of cardiovascular events and even increased risk of cancers- such as colon and breast. Will refill today. Restart Vit D ERGO 50K units once weekly. Will obtain labs today and review at next OV.     Essential hypertension Assessment & Plan BP Readings from Last 3 Encounters:  05/04/24 132/86  02/26/24 138/82  02/26/24 (!) 129/90   The ASCVD Risk score (Arnett DK, et al., 2019) failed to calculate for the following reasons:   Risk score cannot be calculated because patient has a medical history suggesting prior/existing ASCVD  Lab Results  Component Value Date   CREATININE 0.92 02/26/2024   On Norvasc  10 mg daily and Zestoretic  2 tablets day. BP today is not at goal today. Pt states that this could be because she has not taken her medication today. Explained to pt how goal BP is 120s/80s. Pt reports that she will work on being more consistent. Encouraged pt to check her BP at home a couple times and a day and be consistent with her medications. Continue following meal plan and decreasing salt intake.     Adjustment disorder with depressed mood- care giver stress + emotional eating Tobacco use disorder Assessment & Plan Pt was previously taking Wellbutrin  XL but has discontinued the medication. Pt reports that she has noticed a difference in her  hunger and cravings. She also had previously decreased her smoking habits but has restarted her smoking habits. She smokes about a pack and a half a day. Extensive review on how to help her quit smoking with different strategies. Showed pt 1800quit now and how to use it. In order to help with her mood, stress and her tobacco tendencies mutually agreed to Restart Wellbutrin  XL once daily. Continue with finding strategies to help relieve stress will continue monitoring.      Medications Discontinued During This Encounter  Medication Reason   buPROPion  (WELLBUTRIN  XL) 150 MG 24 hr tablet Reorder   Vitamin D , Ergocalciferol , (DRISDOL ) 1.25 MG (50000 UNIT) CAPS capsule Reorder     Meds ordered this encounter  Medications   Vitamin D , Ergocalciferol , (DRISDOL ) 1.25 MG (50000 UNIT) CAPS capsule    Sig: Take 1 capsule (50,000 Units total) by mouth every 7 (seven) days.  Dispense:  12 capsule    Refill:  0   buPROPion  (WELLBUTRIN  XL) 150 MG 24 hr tablet    Sig: Take 1 tablet (150 mg total) by mouth every morning.    Dispense:  90 tablet    Refill:  0      Follow up:   Return 05/31/2024 at 8:00 AM  She was informed of the importance of frequent follow up visits to maximize her success with intensive lifestyle modifications for her multiple health conditions.   Weight Summary and Biometrics   Weight Lost Since Last Visit: 0lb  Weight Gained Since Last Visit: 0lb    Vitals Temp: 98 F (36.7 C) BP: 132/86 Pulse Rate: 73 SpO2: 95 %   Anthropometric Measurements Height: 5' 3 (1.6 m) Weight: 223 lb (101.2 kg) BMI (Calculated): 39.51 Weight at Last Visit: 223lb Weight Lost Since Last Visit: 0lb Weight Gained Since Last Visit: 0lb Starting Weight: 220lb Total Weight Loss (lbs): 0 lb (0 kg)   Body Composition  Body Fat %: 44.1 % Fat Mass (lbs): 98.4 lbs Muscle Mass (lbs): 118.4 lbs Total Body Water (lbs): 74.8 lbs Visceral Fat Rating : 14   Other Clinical Data Fasting:  No Labs: Yes Today's Visit #: 13 Starting Date: 09/25/22    Objective:   PHYSICAL EXAM: Blood pressure 132/86, pulse 73, temperature 98 F (36.7 C), height 5' 3 (1.6 m), weight 223 lb (101.2 kg), last menstrual period 02/11/2016, SpO2 95%. Body mass index is 39.5 kg/m.  General: she is overweight, cooperative and in no acute distress. PSYCH: Has normal mood, affect and thought process.   HEENT: EOMI, sclerae are anicteric. Lungs: Normal breathing effort, no conversational dyspnea. Extremities: Moves * 4 Neurologic: A and O * 3, good insight  DIAGNOSTIC DATA REVIEWED: BMET    Component Value Date/Time   NA 139 02/26/2024 0855   NA 142 08/06/2023 0955   K 3.9 02/26/2024 0855   CL 103 02/26/2024 0855   CO2 26 02/26/2024 0855   GLUCOSE 78 02/26/2024 0855   BUN 10 02/26/2024 0855   BUN 10 08/06/2023 0955   CREATININE 0.92 02/26/2024 0855   CALCIUM 9.6 02/26/2024 0855   GFRNONAA >60 02/26/2024 0855   GFRAA 90 05/10/2019 0855   Lab Results  Component Value Date   HGBA1C 6.1 (H) 08/06/2023   HGBA1C 5.2 03/31/2018   Lab Results  Component Value Date   INSULIN  190.0 (H) 08/06/2023   INSULIN  18.6 09/25/2022   Lab Results  Component Value Date   TSH 0.671 09/25/2022   CBC    Component Value Date/Time   WBC 8.5 02/26/2024 0855   RBC 5.78 (H) 02/26/2024 0855   HGB 17.1 (H) 02/26/2024 0855   HGB 17.3 (H) 08/06/2023 0955   HCT 52.3 (H) 02/26/2024 0855   HCT 52.9 (H) 08/06/2023 0955   PLT 240 02/26/2024 0855   PLT 233 08/06/2023 0955   MCV 90.5 02/26/2024 0855   MCV 90 08/06/2023 0955   MCH 29.6 02/26/2024 0855   MCHC 32.7 02/26/2024 0855   RDW 15.2 02/26/2024 0855   RDW 13.4 08/06/2023 0955   Iron Studies No results found for: IRON, TIBC, FERRITIN, IRONPCTSAT Lipid Panel     Component Value Date/Time   CHOL 180 09/25/2022 1031   TRIG 85 09/25/2022 1031   HDL 55 09/25/2022 1031   CHOLHDL 3.2 03/01/2022 1113   CHOLHDL 3.3 Ratio 02/24/2009 2137    VLDL 29 02/24/2009 2137   LDLCALC 109 (H) 09/25/2022 1031  Hepatic Function Panel     Component Value Date/Time   PROT 7.9 08/06/2023 0955   ALBUMIN 4.2 08/06/2023 0955   AST 15 08/06/2023 0955   ALT 15 08/06/2023 0955   ALKPHOS 70 08/06/2023 0955   BILITOT 0.3 08/06/2023 0955      Component Value Date/Time   TSH 0.671 09/25/2022 1031   Nutritional Lab Results  Component Value Date   VD25OH 25.9 (L) 08/06/2023   VD25OH 10.1 (L) 09/25/2022    Attestations:   LILLETTE Sonny Laroche, acting as a medical scribe for Carolyn Jenkins, DO., have compiled all relevant documentation for today's office visit on behalf of Carolyn Jenkins, DO, while in the presence of [employer], DO.   I have reviewed the above documentation for accuracy and completeness, and I agree with the above. Carolyn JINNY Espinoza, D.O.  The 21st Century Cures Act was signed into law in 2016 which includes the topic of electronic health records.  This provides immediate access to information in MyChart.  This includes consultation notes, operative notes, office notes, lab results and pathology reports.  If you have any questions about what you read please let us  know at your next visit so we can discuss your concerns and take corrective action if need be.  We are right here with you.

## 2024-05-06 LAB — LIPID PANEL
Chol/HDL Ratio: 3.8 ratio (ref 0.0–4.4)
Cholesterol, Total: 173 mg/dL (ref 100–199)
HDL: 45 mg/dL (ref >39)
LDL Chol Calc (NIH): 108 mg/dL — ABNORMAL HIGH (ref 0–99)
Triglycerides: 112 mg/dL (ref 0–149)
VLDL Cholesterol Cal: 20 mg/dL (ref 5–40)

## 2024-05-06 LAB — VITAMIN D 25 HYDROXY (VIT D DEFICIENCY, FRACTURES): Vit D, 25-Hydroxy: 25.6 ng/mL — ABNORMAL LOW (ref 30.0–100.0)

## 2024-05-06 LAB — HEMOGLOBIN A1C
Est. average glucose Bld gHb Est-mCnc: 128 mg/dL
Hgb A1c MFr Bld: 6.1 % — ABNORMAL HIGH (ref 4.8–5.6)

## 2024-05-06 LAB — COMPREHENSIVE METABOLIC PANEL WITH GFR
ALT: 14 IU/L (ref 0–32)
AST: 16 IU/L (ref 0–40)
Albumin: 4.3 g/dL (ref 3.8–4.9)
Alkaline Phosphatase: 76 IU/L (ref 49–135)
BUN/Creatinine Ratio: 17 (ref 9–23)
BUN: 15 mg/dL (ref 6–24)
Bilirubin Total: 0.2 mg/dL (ref 0.0–1.2)
CO2: 17 mmol/L — ABNORMAL LOW (ref 20–29)
Calcium: 9.9 mg/dL (ref 8.7–10.2)
Chloride: 101 mmol/L (ref 96–106)
Creatinine, Ser: 0.89 mg/dL (ref 0.57–1.00)
Globulin, Total: 3.7 g/dL (ref 1.5–4.5)
Glucose: 110 mg/dL — ABNORMAL HIGH (ref 70–99)
Potassium: 4.1 mmol/L (ref 3.5–5.2)
Sodium: 139 mmol/L (ref 134–144)
Total Protein: 8 g/dL (ref 6.0–8.5)
eGFR: 75 mL/min/1.73 (ref >59)

## 2024-05-06 LAB — MAGNESIUM: Magnesium: 1.7 mg/dL (ref 1.6–2.3)

## 2024-05-06 LAB — VITAMIN B12: Vitamin B-12: 443 pg/mL (ref 232–1245)

## 2024-05-06 LAB — INSULIN, RANDOM: INSULIN: 115 u[IU]/mL — ABNORMAL HIGH (ref 2.6–24.9)

## 2024-05-21 ENCOUNTER — Other Ambulatory Visit (INDEPENDENT_AMBULATORY_CARE_PROVIDER_SITE_OTHER): Payer: Self-pay | Admitting: Primary Care

## 2024-05-21 DIAGNOSIS — Z760 Encounter for issue of repeat prescription: Secondary | ICD-10-CM

## 2024-05-21 DIAGNOSIS — I10 Essential (primary) hypertension: Secondary | ICD-10-CM

## 2024-05-21 NOTE — Telephone Encounter (Signed)
 Will forward to provider

## 2024-05-22 ENCOUNTER — Other Ambulatory Visit (INDEPENDENT_AMBULATORY_CARE_PROVIDER_SITE_OTHER): Payer: Self-pay | Admitting: Primary Care

## 2024-05-22 DIAGNOSIS — I10 Essential (primary) hypertension: Secondary | ICD-10-CM

## 2024-05-22 DIAGNOSIS — Z760 Encounter for issue of repeat prescription: Secondary | ICD-10-CM

## 2024-05-24 NOTE — Telephone Encounter (Signed)
 Requested Prescriptions  Pending Prescriptions Disp Refills   amLODipine  (NORVASC ) 10 MG tablet [Pharmacy Med Name: AMLODIPINE  BESYLATE 10MG  TABLETS] 90 tablet 0    Sig: TAKE 1 TABLET BY MOUTH DAILY     Cardiovascular: Calcium Channel Blockers 2 Failed - 05/24/2024  4:28 PM      Failed - Valid encounter within last 6 months    Recent Outpatient Visits           2 months ago Frequent urination   [place] [facility] Celestia Rosaline SQUIBB, NP   8 months ago Essential hypertension   [place] [facility] Celestia Rosaline SQUIBB, NP   10 months ago Colon cancer screening   [place] [facility] Celestia Rosaline SQUIBB, NP   1 year ago Advised about management of weight   [place] [facility] Celestia Rosaline SQUIBB, NP   1 year ago Need for immunization against influenza   [place] [facility] Celestia Rosaline SQUIBB, NP              Passed - Last BP in normal range    BP Readings from Last 1 Encounters:  05/04/24 132/86         Passed - Last Heart Rate in normal range    Pulse Readings from Last 1 Encounters:  05/04/24 73

## 2024-05-25 ENCOUNTER — Other Ambulatory Visit (INDEPENDENT_AMBULATORY_CARE_PROVIDER_SITE_OTHER): Payer: Self-pay | Admitting: Primary Care

## 2024-05-25 DIAGNOSIS — R062 Wheezing: Secondary | ICD-10-CM

## 2024-05-25 DIAGNOSIS — Z760 Encounter for issue of repeat prescription: Secondary | ICD-10-CM

## 2024-05-25 DIAGNOSIS — I10 Essential (primary) hypertension: Secondary | ICD-10-CM

## 2024-05-25 MED ORDER — AMLODIPINE BESYLATE 10 MG PO TABS: 10.0000 mg | ORAL_TABLET | Freq: Every day | ORAL | 0 refills | Status: DC

## 2024-05-25 MED ORDER — LISINOPRIL-HYDROCHLOROTHIAZIDE 20-12.5 MG PO TABS: 2.0000 | ORAL_TABLET | Freq: Every day | ORAL | 0 refills | Status: DC

## 2024-05-31 ENCOUNTER — Other Ambulatory Visit (INDEPENDENT_AMBULATORY_CARE_PROVIDER_SITE_OTHER): Payer: Self-pay | Admitting: Family Medicine

## 2024-05-31 ENCOUNTER — Ambulatory Visit (INDEPENDENT_AMBULATORY_CARE_PROVIDER_SITE_OTHER): Payer: Self-pay | Admitting: Family Medicine

## 2024-05-31 ENCOUNTER — Encounter (INDEPENDENT_AMBULATORY_CARE_PROVIDER_SITE_OTHER): Payer: Self-pay | Admitting: Family Medicine

## 2024-05-31 DIAGNOSIS — E782 Mixed hyperlipidemia: Secondary | ICD-10-CM

## 2024-05-31 DIAGNOSIS — E538 Deficiency of other specified B group vitamins: Secondary | ICD-10-CM | POA: Diagnosis not present

## 2024-05-31 DIAGNOSIS — I10 Essential (primary) hypertension: Secondary | ICD-10-CM | POA: Diagnosis not present

## 2024-05-31 DIAGNOSIS — F17200 Nicotine dependence, unspecified, uncomplicated: Secondary | ICD-10-CM

## 2024-05-31 DIAGNOSIS — Z6839 Body mass index (BMI) 39.0-39.9, adult: Secondary | ICD-10-CM

## 2024-05-31 DIAGNOSIS — F17210 Nicotine dependence, cigarettes, uncomplicated: Secondary | ICD-10-CM

## 2024-05-31 DIAGNOSIS — E669 Obesity, unspecified: Secondary | ICD-10-CM

## 2024-05-31 DIAGNOSIS — R7303 Prediabetes: Secondary | ICD-10-CM

## 2024-05-31 DIAGNOSIS — E559 Vitamin D deficiency, unspecified: Secondary | ICD-10-CM

## 2024-05-31 MED ORDER — CYANOCOBALAMIN 500 MCG PO TABS: 500.0000 ug | ORAL_TABLET | Freq: Every day | ORAL | Status: AC

## 2024-05-31 MED ORDER — ROSUVASTATIN CALCIUM 10 MG PO TABS: 10.0000 mg | ORAL_TABLET | Freq: Every day | ORAL | 1 refills | Status: AC

## 2024-05-31 NOTE — Progress Notes (Signed)
 Carolyn Espinoza, D.O.  ABFM, ABOM Specializing in Clinical Bariatric Medicine  Office located at: [address] [place], [place]  [address]      A) FOR THE CHRONIC DISEASE OF OBESITY:  Obesity with starting BMI of 39.0/date 09/25/22 BMI 39.0-39.9,adult - Current BMI 39.16  Chief complaint: Obesity Carolyn Espinoza is here to discuss her progress with her obesity treatment plan.   History of present illness / Interval history:  Carolyn Espinoza is here today for her follow-up office visit.  Since last OV on 05/04/2024, pt is down 2 lbs.   Reports no current income, but has been using SNAP benefits to purchase more whole foods recently.    05/04/24 08:00 05/31/24 08:00   Body Fat % 44.1 % 43.8 %  Muscle Mass (lbs) 118.4 lbs 118.2 lbs  Fat Mass (lbs) 98.4 lbs 97 lbs  Total Body Water (lbs) 74.8 lbs 74.8 lbs  Visceral Fat Rating  14 14  Counseling done on how various foods will affect these numbers and how to maximize success   Total lbs lost to date: +1 lbs Total Fat Mass in lbs lost to date: +0.2 Total weight loss percentage to date: +0.45 %   Nutrition Therapy She is on the Category 1 Plan with B/L options and start journaling 1000 calories and 80+ g of protein per day  and states she is following her eating plan approximately 90 % of the time.   - Tracking Calories/Macros: no -    - Eating More Whole Foods: yes  - Adequate Protein Intake: yes  - Adequate Water Intake: yes  - Skipping Meals: no -    - Sleeping 7-9 Hours/ Night: yes   Carolyn Espinoza is currently in the action stage of change. As such, her goal is to continue weight management plan.  She has agreed to: continue current plan   Physical Activity Pt is doing various exercises and walking 20  minutes 3 days per week   Ziggy has been advised to work up to 300-450 minutes of moderate intensity aerobic activity a week and strengthening exercises 2-3 times per week for cardiovascular health, weight loss maintenance  and preservation of muscle mass.  She has agreed to : Think about enjoyable ways to increase daily physical activity and overcoming barriers to exercise, Increase physical activity in their day and reduce sedentary time (increase NEAT)., Increase volume of physical activity to a goal of 240 minutes a week, and Combine aerobic and strengthening exercises for efficiency and improved cardiometabolic health.   Behavioral Modifications Evidence-based interventions for health behavior change were utilized today including the discussion of  1) self monitoring techniques:  journal 2) problem-solving barriers:  healthier, high protein foods to replace favorite foods 3) self care:  exercise 4) SMART goals for next OV:  none Regarding patient's less desirable eating habits and patterns, we employed the technique of small changes.   We discussed the following today: increasing lean protein intake to established goals, keeping healthy foods at home, and focusing on food with a 10:1 ratio of calories: grams of protein, high protein pastas, high protein pasta sauce alternatives, food pantries.  Additional resources provided today: None   Medical Interventions/ Pharmacotherapy Previous Bariatric surgery: none Pharmacotherapy for weight loss: She is currently taking Wellbutrin  XL 150 mg daily for medical weight loss.    We discussed various medication options to help Carolyn Espinoza with her weight loss efforts and we both agreed to : Continue with current nutritional and behavioral  strategies   B) OBESITY RELATED CONDITIONS ADDRESSED TODAY:  Obesity with starting BMI of 39.0/date 09/25/22 BMI 39.0-39.9,adult - Current BMI 39.16   Prediabetes Assessment & Plan Lab Results  Component Value Date   HGBA1C 6.1 (H) 05/04/2024   HGBA1C 6.1 (H) 08/06/2023   HGBA1C 6.0 (H) 09/25/2022   INSULIN  115.0 (H) 05/04/2024   INSULIN  190.0 (H) 08/06/2023   INSULIN  18.6 09/25/2022   Lab Results  Component Value Date    CREATININE 0.89 05/04/2024   BUN 15 05/04/2024   NA 139 05/04/2024   K 4.1 05/04/2024   CL 101 05/04/2024   CO2 17 (L) 05/04/2024  Currently managed by dietary and lifestyle interventions. Hunger and cravings are overall well controlled. Discussed option of Metformin to help manage cravings, pt declines for now. Reviewed labs with pt from 05/04/2024, A1c is above goal at 6.1; optimal< 5.6. Insulin  is elevated. To decrease A1c, encouraged to cont decreasing adipose tissue. Cont to decrease simple carbs/sugars and increase lean protein. Increase exercise as able. Kidney function has improved. Cont adequate water intake.     Essential hypertension Assessment & Plan BP Readings from Last 3 Encounters:  05/31/24 113/79  05/04/24 132/86  02/26/24 138/82   The ASCVD Risk score (Arnett DK, et al., 2019) failed to calculate for the following reasons:   Risk score cannot be calculated because patient has a medical history suggesting prior/existing ASCVD  Lab Results  Component Value Date   CREATININE 0.89 05/04/2024  Currently on Norvasc  10 mg daily and lisinopril -hydrochlorothiazide  20-12.5 mg daily with good compliance and tolerance. BP is well controlled today at 113/79. Cont low-sodium, heart-healthy diet and adequate water intake to manage BP. Increase exercise as able. Follow up with PCP as necessary.     Mixed hyperlipidemia Tobacco use disorder Assessment & Plan  Lab Results  Component Value Date   CHOL 173 05/04/2024   HDL 45 05/04/2024   LDLCALC 108 (H) 05/04/2024   TRIG 112 05/04/2024   CHOLHDL 3.8 05/04/2024  Currently managed by dietary and lifestyle interventions. Reviewed labs with pt from 05/04/2024. LDL has improved from 109 on 09/25/2022 to 108 on 05/04/2024. Optimal <100. HDL has worsened from 55 to 45. Optimal >60.Cont to decrease saturated/trans fats and increase exercise. Increase water intake  Pt has a h/o smoking and smokes approximately a pack and a half day.  Educated pt that given her tobacco use history, cholesterol medication would be beneficial to help reduce risk of heart disease. Start Crestor 10 mg once daily. Discussed risks and benefits of medication. Encouraged to follow up with PCP about cholesterol medication management.    Vitamin D  deficiency Assessment & Plan Lab Results  Component Value Date   VD25OH 25.6 (L) 05/04/2024   VD25OH 25.9 (L) 08/06/2023   VD25OH 10.1 (L) 09/25/2022  Currently on Ergo 50K units once weekly with good compliance and tolerance. Reviewed labs with pt from 05/04/2024, Vit D levels below goal at 25.6; optimal 50-70. No acute concerns today. Cont regimen and will recheck levels in 3-4 months.     B12 deficiency Assessment & Plan  - B12 level is 443, not at goal of over 500.  This diagnosis was reviewed with the patient and education was provided.  Lab Results  Component Value Date   VITAMINB12 443 05/04/2024  Currently on OTC Vit B12 500 mcg with good compliance and tolerance. No acute concerns today. Cont supplementation (refill today). Continue prudent nutritional plan and focus on b12 rich foods such as  lean red meats; poultry; eggs; seafood; beans, peas, and lentils; nuts and seeds; and soy products. We will continue to monitor as deemed clinically necessary. Cont to monitor.       Medications Discontinued During This Encounter  Medication Reason   cyanocobalamin  (VITAMIN B12) 500 MCG tablet Reorder     Meds ordered this encounter  Medications   cyanocobalamin  (VITAMIN B12) 500 MCG tablet    Sig: Take 1 tablet (500 mcg total) by mouth daily.   rosuvastatin (CRESTOR) 10 MG tablet    Sig: Take 1 tablet (10 mg total) by mouth at bedtime.    Dispense:  30 tablet    Refill:  1    Future RF's per PCP      Follow up:   Return 07/05/2024 8:20 AM.  She was informed of the importance of frequent follow up visits to maximize her success with intensive lifestyle modifications for her multiple  health conditions.   Weight Summary and Biometrics   Anthropometric Measurements Height: 5' 3 (1.6 m) Weight at Last Visit: 223lb Starting Weight: 220lb    Other Clinical Data Labs: no Today's Visit #: 14 Starting Date: 09/25/22    Objective:   PHYSICAL EXAM: Height 5' 3 (1.6 m), last menstrual period 02/11/2016. Body mass index is 39.5 kg/m.  General: she is overweight, cooperative and in no acute distress. PSYCH: Has normal mood, affect and thought process.   HEENT: EOMI, sclerae are anicteric. Lungs: Normal breathing effort, no conversational dyspnea. Extremities: Moves * 4 Neurologic: A and O * 3, good insight  DIAGNOSTIC DATA REVIEWED: BMET    Component Value Date/Time   NA 139 05/04/2024 0857   K 4.1 05/04/2024 0857   CL 101 05/04/2024 0857   CO2 17 (L) 05/04/2024 0857   GLUCOSE 110 (H) 05/04/2024 0857   GLUCOSE 78 02/26/2024 0855   BUN 15 05/04/2024 0857   CREATININE 0.89 05/04/2024 0857   CALCIUM 9.9 05/04/2024 0857   GFRNONAA >60 02/26/2024 0855   GFRAA 90 05/10/2019 0855   Lab Results  Component Value Date   HGBA1C 6.1 (H) 05/04/2024   HGBA1C 5.2 03/31/2018   Lab Results  Component Value Date   INSULIN  115.0 (H) 05/04/2024   INSULIN  18.6 09/25/2022   Lab Results  Component Value Date   TSH 0.671 09/25/2022   CBC    Component Value Date/Time   WBC 8.5 02/26/2024 0855   RBC 5.78 (H) 02/26/2024 0855   HGB 17.1 (H) 02/26/2024 0855   HGB 17.3 (H) 08/06/2023 0955   HCT 52.3 (H) 02/26/2024 0855   HCT 52.9 (H) 08/06/2023 0955   PLT 240 02/26/2024 0855   PLT 233 08/06/2023 0955   MCV 90.5 02/26/2024 0855   MCV 90 08/06/2023 0955   MCH 29.6 02/26/2024 0855   MCHC 32.7 02/26/2024 0855   RDW 15.2 02/26/2024 0855   RDW 13.4 08/06/2023 0955   Iron Studies No results found for: IRON, TIBC, FERRITIN, IRONPCTSAT Lipid Panel     Component Value Date/Time   CHOL 173 05/04/2024 0857   TRIG 112 05/04/2024 0857   HDL 45 05/04/2024  0857   CHOLHDL 3.8 05/04/2024 0857   CHOLHDL 3.3 Ratio 02/24/2009 2137   VLDL 29 02/24/2009 2137   LDLCALC 108 (H) 05/04/2024 0857   Hepatic Function Panel     Component Value Date/Time   PROT 8.0 05/04/2024 0857   ALBUMIN 4.3 05/04/2024 0857   AST 16 05/04/2024 0857   ALT 14 05/04/2024 0857  ALKPHOS 76 05/04/2024 0857   BILITOT 0.2 05/04/2024 0857      Component Value Date/Time   TSH 0.671 09/25/2022 1031   Nutritional Lab Results  Component Value Date   VD25OH 25.6 (L) 05/04/2024   VD25OH 25.9 (L) 08/06/2023   VD25OH 10.1 (L) 09/25/2022    Attestations:   Carolyn Espinoza, acting as a stage manager for Carolyn Jenkins, DO., have compiled all relevant documentation for today's office visit on behalf of Carolyn Jenkins, DO, while in the presence of [employer], DO.   I have reviewed the above documentation for accuracy and completeness, and I agree with the above. Carolyn Espinoza, D.O.  The 21st Century Cures Act was signed into law in 2016 which includes the topic of electronic health records.  This provides immediate access to information in MyChart.  This includes consultation notes, operative notes, office notes, lab results and pathology reports.  If you have any questions about what you read please let us  know at your next visit so we can discuss your concerns and take corrective action if need be.  We are right here with you.

## 2024-05-31 NOTE — Patient Instructions (Signed)
 SABRA

## 2024-06-21 ENCOUNTER — Telehealth (INDEPENDENT_AMBULATORY_CARE_PROVIDER_SITE_OTHER): Payer: Self-pay | Admitting: Primary Care

## 2024-06-21 NOTE — Telephone Encounter (Signed)
 Left VM with pt about their upcoming appt. Pt did not answer

## 2024-06-22 ENCOUNTER — Ambulatory Visit (INDEPENDENT_AMBULATORY_CARE_PROVIDER_SITE_OTHER): Admitting: Primary Care

## 2024-06-30 ENCOUNTER — Ambulatory Visit (INDEPENDENT_AMBULATORY_CARE_PROVIDER_SITE_OTHER): Admitting: Family Medicine

## 2024-07-05 ENCOUNTER — Ambulatory Visit (INDEPENDENT_AMBULATORY_CARE_PROVIDER_SITE_OTHER): Admitting: Family Medicine

## 2024-07-13 ENCOUNTER — Encounter (HOSPITAL_COMMUNITY): Payer: Self-pay

## 2024-07-13 ENCOUNTER — Emergency Department (HOSPITAL_COMMUNITY)

## 2024-07-13 ENCOUNTER — Other Ambulatory Visit: Payer: Self-pay

## 2024-07-13 ENCOUNTER — Emergency Department (HOSPITAL_COMMUNITY)
Admission: EM | Admit: 2024-07-13 | Discharge: 2024-07-13 | Discharge status: Against Medical Advice | Attending: Emergency Medicine | Admitting: Emergency Medicine

## 2024-07-13 DIAGNOSIS — R0789 Other chest pain: Secondary | ICD-10-CM | POA: Diagnosis not present

## 2024-07-13 DIAGNOSIS — Z5321 Procedure and treatment not carried out due to patient leaving prior to being seen by health care provider: Secondary | ICD-10-CM | POA: Diagnosis not present

## 2024-07-13 DIAGNOSIS — M25511 Pain in right shoulder: Secondary | ICD-10-CM | POA: Diagnosis present

## 2024-07-13 LAB — CBC WITH DIFFERENTIAL/PLATELET
Abs Immature Granulocytes: 0.03 K/uL (ref 0.00–0.07)
Basophils Absolute: 0.1 K/uL (ref 0.0–0.1)
Basophils Relative: 1 %
Eosinophils Absolute: 0.1 K/uL (ref 0.0–0.5)
Eosinophils Relative: 1 %
HCT: 48.3 % — ABNORMAL HIGH (ref 36.0–46.0)
Hemoglobin: 15.7 g/dL — ABNORMAL HIGH (ref 12.0–15.0)
Immature Granulocytes: 0 %
Lymphocytes Relative: 30 %
Lymphs Abs: 2.9 K/uL (ref 0.7–4.0)
MCH: 29.8 pg (ref 26.0–34.0)
MCHC: 32.5 g/dL (ref 30.0–36.0)
MCV: 91.8 fL (ref 80.0–100.0)
Monocytes Absolute: 0.5 K/uL (ref 0.1–1.0)
Monocytes Relative: 5 %
Neutro Abs: 6.1 K/uL (ref 1.7–7.7)
Neutrophils Relative %: 63 %
Platelets: 233 K/uL (ref 150–400)
RBC: 5.26 MIL/uL — ABNORMAL HIGH (ref 3.87–5.11)
RDW: 14.7 % (ref 11.5–15.5)
WBC: 9.6 K/uL (ref 4.0–10.5)
nRBC: 0 % (ref 0.0–0.2)

## 2024-07-13 LAB — BASIC METABOLIC PANEL WITH GFR
Anion gap: 7 (ref 5–15)
BUN: 14 mg/dL (ref 6–20)
CO2: 30 mmol/L (ref 22–32)
Calcium: 9.4 mg/dL (ref 8.9–10.3)
Chloride: 103 mmol/L (ref 98–111)
Creatinine, Ser: 0.84 mg/dL (ref 0.44–1.00)
GFR, Estimated: >60 mL/min
Glucose, Bld: 108 mg/dL — ABNORMAL HIGH (ref 70–99)
Potassium: 3.9 mmol/L (ref 3.5–5.1)
Sodium: 140 mmol/L (ref 135–145)

## 2024-07-13 LAB — MAGNESIUM: Magnesium: 1.9 mg/dL (ref 1.7–2.4)

## 2024-07-13 LAB — TROPONIN T, HIGH SENSITIVITY: Troponin T High Sensitivity: <15 ng/L (ref 0–19)

## 2024-07-13 NOTE — ED Triage Notes (Signed)
 Pt has been having right sided shoulder pain for several days.  Not associated with any trauma.

## 2024-07-13 NOTE — ED Provider Triage Note (Signed)
 Emergency Medicine Provider Triage Evaluation Note  OTTO CARAWAY , a 58 y.o. female  was evaluated in triage.  Pt complains of 3 days of right shoulder pain.  Also endorses right-sided chest wall pain.  Denies any injury.  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  Review of Systems  Positive: As above Negative: As above  Physical Exam  BP (!) 176/87 (BP Location: Left Arm)   Pulse 85   Temp 98.4 F (36.9 C)   Resp 20   Ht 5' 3 (1.6 m)   Wt 99.8 kg   LMP 02/11/2016   SpO2 90%   BMI 38.97 kg/m  Gen:   Awake, no distress   Resp:  Normal effort  MSK:   Moves extremities without difficulty  Other:    Medical Decision Making  Medically screening exam initiated at 4:07 PM.  Appropriate orders placed.  MARIS ABASCAL was informed that the remainder of the evaluation will be completed by another provider, this initial triage assessment does not replace that evaluation, and the importance of remaining in the ED until their evaluation is complete.    Hildegard Loge, PA-C 07/13/24 [phone]

## 2024-07-14 ENCOUNTER — Emergency Department (HOSPITAL_COMMUNITY)
Admission: EM | Admit: 2024-07-14 | Discharge: 2024-07-14 | Disposition: A | Discharge status: Home / Self Care | Attending: Emergency Medicine | Admitting: Emergency Medicine

## 2024-07-14 ENCOUNTER — Other Ambulatory Visit: Payer: Self-pay

## 2024-07-14 DIAGNOSIS — M25511 Pain in right shoulder: Secondary | ICD-10-CM | POA: Diagnosis present

## 2024-07-14 DIAGNOSIS — I10 Essential (primary) hypertension: Secondary | ICD-10-CM | POA: Insufficient documentation

## 2024-07-14 MED ORDER — PREDNISONE 20 MG PO TABS: 40.0000 mg | ORAL_TABLET | Freq: Every day | ORAL | 0 refills | Status: AC

## 2024-07-14 MED ORDER — TRAMADOL HCL 50 MG PO TABS: 50.0000 mg | ORAL_TABLET | Freq: Four times a day (QID) | ORAL | 0 refills | Status: AC | PRN

## 2024-07-14 MED ORDER — KETOROLAC TROMETHAMINE 15 MG/ML IJ SOLN
15.0000 mg | Freq: Once | INTRAMUSCULAR | Status: AC
  Administered 2024-07-14: 15 mg via INTRAMUSCULAR
  Filled 2024-07-14: qty 1

## 2024-07-14 MED ORDER — TRAMADOL HCL 50 MG PO TABS
50.0000 mg | ORAL_TABLET | Freq: Once | ORAL | Status: AC
  Administered 2024-07-14: 50 mg via ORAL
  Filled 2024-07-14: qty 1

## 2024-07-14 MED ORDER — NAPROXEN 500 MG PO TABS: 500.0000 mg | ORAL_TABLET | Freq: Two times a day (BID) | ORAL | 0 refills | Status: AC

## 2024-07-14 MED ORDER — METHYLPREDNISOLONE SODIUM SUCC 40 MG IJ SOLR
40.0000 mg | Freq: Once | INTRAMUSCULAR | Status: AC
  Administered 2024-07-14: 40 mg via INTRAMUSCULAR
  Filled 2024-07-14: qty 1

## 2024-07-14 NOTE — ED Triage Notes (Signed)
 Pt having right shoulder pain, seen at [facility] yesterday but could not stay d/t transportation issues. Trying bc powder and aleve  with no relief.

## 2024-07-14 NOTE — ED Provider Notes (Signed)
 " [place] EMERGENCY DEPARTMENT AT [facility] Provider Note   CSN: [id number] Arrival date & time: 07/14/24  1623     Patient presents with: Shoulder Pain   Carolyn Espinoza is a 58 y.o. female.  Patient is a 58 year old female with a history of hypertension who presents to the ED for increasing right shoulder pain for the past 3 days.  Notes she woke up with severe pain in the right shoulder.  Pain is worse on the outside of the right shoulder and hurts with movement.  Denies fall or injury.  States she has had issues with pain like this in her shoulder previously but does not usually last this long.  Has been taking Aleve  and Goody powder with minimal relief.  Denies fevers, headache, chest pain, shortness of breath.  No further complaints.    Shoulder Pain Associated symptoms: no fever        Prior to Admission medications  Medication Sig Start Date End Date Taking? Authorizing Provider  naproxen  (NAPROSYN ) 500 MG tablet Take 1 tablet (500 mg total) by mouth 2 (two) times daily. 07/14/24  Yes Jamieka Royle, Thersia RAMAN, PA-C  predniSONE  (DELTASONE ) 20 MG tablet Take 2 tablets (40 mg total) by mouth daily for 5 days. 07/14/24 07/19/24 Yes Welles Walthall, Thersia RAMAN, PA-C  traMADol  (ULTRAM ) 50 MG tablet Take 1 tablet (50 mg total) by mouth every 6 (six) hours as needed for up to 5 days. 07/14/24 07/19/24 Yes Hevin Jeffcoat, Thersia RAMAN, PA-C  albuterol  (VENTOLIN  HFA) 108 (90 Base) MCG/ACT inhaler Inhale 2 puffs into the lungs every 4 (four) hours as needed for wheezing or shortness of breath. 07/08/23   Celestia Rosaline SQUIBB, NP  amLODipine  (NORVASC ) 10 MG tablet Take 1 tablet (10 mg total) by mouth daily. 05/25/24   Celestia Rosaline SQUIBB, NP  buPROPion  (WELLBUTRIN  XL) 150 MG 24 hr tablet Take 1 tablet (150 mg total) by mouth every morning. 05/04/24   Opalski, Barnie, DO  colchicine  0.6 MG tablet Take 1 tablet (0.6 mg total) by mouth daily as needed (gout pain). 02/26/24   Vonna Sharlet POUR, MD  cyanocobalamin   (VITAMIN B12) 500 MCG tablet Take 1 tablet (500 mcg total) by mouth daily. 05/31/24   Midge Barnie, DO  lisinopril -hydrochlorothiazide  (ZESTORETIC ) 20-12.5 MG tablet Take 2 tablets by mouth daily. 05/25/24   Celestia Rosaline SQUIBB, NP  Multiple Vitamin (MULTIVITAMIN WITH MINERALS) TABS tablet Take 1 tablet by mouth daily.    [provider]  rosuvastatin  (CRESTOR ) 10 MG tablet Take 1 tablet (10 mg total) by mouth at bedtime. 05/31/24   Opalski, Deborah, DO  Vitamin D , Ergocalciferol , (DRISDOL ) 1.25 MG (50000 UNIT) CAPS capsule Take 1 capsule (50,000 Units total) by mouth every 7 (seven) days. 05/04/24   Midge Barnie, DO    Allergies: Patient has no known allergies.    Review of Systems  Constitutional:  Negative for fever.  Respiratory:  Positive for cough. Negative for shortness of breath.   Cardiovascular:  Negative for chest pain.  Musculoskeletal:  Positive for arthralgias.  All other systems reviewed and are negative.   Updated Vital Signs BP (!) 145/92   Pulse 78   Temp 98.1 F (36.7 C)   Resp 18   LMP 02/11/2016   SpO2 95%   Physical Exam Constitutional:      Appearance: Normal appearance.  HENT:     Head: Normocephalic and atraumatic.     Nose: Nose normal.     Mouth/Throat:     Mouth:  Mucous membranes are moist.     Pharynx: Oropharynx is clear.  Cardiovascular:     Rate and Rhythm: Normal rate.  Pulmonary:     Effort: Pulmonary effort is normal.     Breath sounds: Normal breath sounds.  Musculoskeletal:     Comments: Radial pulses 2+ bilaterally.  Equal handgrip bilaterally.  Tender palpation on the right lateral shoulder over the bursa area.  No obvious deformities, erythema, edema.  Limited overhead range of motion secondary to pain.  Skin:    General: Skin is warm and dry.  Neurological:     Mental Status: She is alert and oriented to person, place, and time.  Psychiatric:        Mood and Affect: Mood normal.        Behavior: Behavior normal.      (all labs ordered are listed, but only abnormal results are displayed) Labs Reviewed - No data to display  EKG: None  Radiology: DG Chest 2 View Result Date: 07/13/2024 CLINICAL DATA:  Chest pain EXAM: CHEST - 2 VIEW COMPARISON:  04/28/2020 FINDINGS: Negative for pleural effusion. Suspicion of airways thickening. Stable cardiomediastinal silhouette. No pneumothorax IMPRESSION: Suspicion of airways thickening, possible bronchitis. No focal airspace disease. Electronically Signed   By: Luke Bun M.D.   On: 07/13/2024 18:04   DG Shoulder Right Result Date: 07/13/2024 CLINICAL DATA:  Shoulder pain EXAM: RIGHT SHOULDER - 2+ VIEW COMPARISON:  None Available. FINDINGS: No fracture or malalignment. Mild AC joint and glenohumeral degenerative change. Calcific tendinopathy or calcific bursitis at the superolateral humeral head. IMPRESSION: Mild degenerative changes. Calcific tendinopathy or calcific bursitis at the superolateral humeral head. Electronically Signed   By: Luke Bun M.D.   On: 07/13/2024 18:02     Medications Ordered in the ED  ketorolac  (TORADOL ) 15 MG/ML injection 15 mg (15 mg Intramuscular Given 07/14/24 1900)  methylPREDNISolone  sodium succinate (SOLU-MEDROL ) 40 mg/mL injection 40 mg (40 mg Intramuscular Given 07/14/24 1900)  traMADol  (ULTRAM ) tablet 50 mg (50 mg Oral Given 07/14/24 1900)                                   Medical Decision Making Patient is a 58 year old female with a history of hypertension who presents to the ED for increasing right shoulder pain for the past 3 days.  Please see detailed HPI above.  On exam patient is alert and well-appearing.  Physical exam as noted above.  Neurovascular intact.  Of note patient was seen at the [facility] ED last evening but left due to issues with transportation.  Lab workup was overall unremarkable including no electrolyte abnormalities and negative troponin.  EKG from today reviewed and shows sinus rhythm.   Chest x-ray yesterday did show concerns for bronchitis.  X-ray of the right shoulder yesterday shows some mild arthritis as well as calcific tendinopathy versus bursitis.  Otherwise no acute fractures/dislocations.  Suspect patient's symptoms are secondary to arthritis versus tendinitis.  There could be concerns for [facility] joint degeneration as well.  Less concerns for ACS or acute STEMI today with reassuring labs yesterday and repeat EKG stable today.  Patient given Solu-Medrol , Toradol , and tramadol  while in ED.  Otherwise stable for discharge home today.  Prescribed prednisone , tramadol , and naproxen .  Symptomatic care discussed.  Resources for orthopedic follow-up provided for further evaluation and management of continued shoulder pain.  Strict return precautions provided for symptoms.  Risk Prescription  drug management.      Final diagnoses:  Acute pain of right shoulder    ED Discharge Orders          Ordered    predniSONE  (DELTASONE ) 20 MG tablet  Daily        07/14/24 1852    traMADol  (ULTRAM ) 50 MG tablet  Every 6 hours PRN        07/14/24 1852    naproxen  (NAPROSYN ) 500 MG tablet  2 times daily        07/14/24 1852               Neysa Thersia RAMAN, [place] 07/14/24 1924  "

## 2024-07-14 NOTE — Discharge Instructions (Signed)
 Begin taking prednisone  as prescribed.  May apply ice to the shoulder to help with any pain/swelling.  May take naproxen  twice a day as needed for pain.  Do not take extra Aleve  or Goody powder at the same time.  May take tramadol  every 6 hours as needed for severe pain.  Please follow-up with orthopedics for further evaluation and management of continued shoulder pain.  Call to schedule follow-up appointment.  Return to ED if any symptoms worsen including severe color changes to the arm, chest pain, difficulty breathing.

## 2024-07-15 ENCOUNTER — Telehealth

## 2024-08-09 ENCOUNTER — Telehealth: Payer: Self-pay | Admitting: Primary Care

## 2024-08-09 NOTE — Telephone Encounter (Signed)
 08/09/24 called patient and confirmed address and appointment

## 2024-08-11 ENCOUNTER — Ambulatory Visit: Admitting: Primary Care

## 2024-08-11 ENCOUNTER — Encounter: Payer: Self-pay | Admitting: Primary Care

## 2024-08-11 VITALS — BP 135/82 | HR 63 | Temp 98.4°F | Resp 16 | Ht 63.0 in | Wt 220.0 lb

## 2024-08-11 DIAGNOSIS — Z7689 Persons encountering health services in other specified circumstances: Secondary | ICD-10-CM

## 2024-08-11 DIAGNOSIS — Z760 Encounter for issue of repeat prescription: Secondary | ICD-10-CM

## 2024-08-11 DIAGNOSIS — Z6839 Body mass index (BMI) 39.0-39.9, adult: Secondary | ICD-10-CM

## 2024-08-11 DIAGNOSIS — I10 Essential (primary) hypertension: Secondary | ICD-10-CM

## 2024-08-11 DIAGNOSIS — N3946 Mixed incontinence: Secondary | ICD-10-CM

## 2024-08-11 DIAGNOSIS — R350 Frequency of micturition: Secondary | ICD-10-CM

## 2024-08-11 DIAGNOSIS — E66812 Obesity, class 2: Secondary | ICD-10-CM

## 2024-08-11 MED ORDER — AMLODIPINE BESYLATE 10 MG PO TABS: 10.0000 mg | ORAL_TABLET | Freq: Every day | ORAL | 1 refills | Status: AC

## 2024-08-11 MED ORDER — OXYBUTYNIN CHLORIDE ER 10 MG PO TB24: 10.0000 mg | ORAL_TABLET | Freq: Every day | ORAL | 1 refills | Status: AC

## 2024-08-11 MED ORDER — LISINOPRIL-HYDROCHLOROTHIAZIDE 20-12.5 MG PO TABS: 2.0000 | ORAL_TABLET | Freq: Every day | ORAL | 1 refills | Status: AC

## 2024-08-11 NOTE — Progress Notes (Signed)
 " [facility]  Carolyn Espinoza, is a 59 y.o. female  RDW:[id number]  FMW:[id number]  DOB - [birth year]  Urinary incontinence       Subjective:   Carolyn Espinoza is a 59 y.o. female here today for a follow up acute visit. Patient has No headache, No chest pain, No abdominal pain - No Nausea, No new weakness tingling or numbness, No Cough - shortness of breath Urinary Frequency  This is a chronic problem. The current episode started more than 1 year ago. The problem occurs every urination. The problem has been gradually worsening. The patient is experiencing no pain. There has been no fever. She is Not sexually active. There is No history of pyelonephritis. Associated symptoms include frequency and hesitancy. Pertinent negatives include no urgency. She has tried nothing for the symptoms.  She sits for a period of time when she stands up to walk she dribbles, mixed incontinence and wears pad and brief and both are stoked.  No problems updated.  Comprehensive ROS Pertinent positive and negative noted in HPI   Allergies[1]  Past Medical History:  Diagnosis Date   Asthma    Back pain    Constipation    HTN (hypertension) 07/22/2010    Medications Ordered Prior to Encounter[2] Health Maintenance  Topic Date Due   Zoster (Shingles) Vaccine (2 of 2) 04/26/2022   Pap with HPV screening  02/28/2023   Flu Shot  02/20/2024   COVID-19 Vaccine (4 - 2025-26 season) 03/22/2024   Pneumococcal Vaccine for age over 2 (1 of 2 - PCV) 02/25/2025*   Breast Cancer Screening  10/20/2024   Colon Cancer Screening  09/18/2026   DTaP/Tdap/Td vaccine (3 - Td or Tdap) 08/02/2029   HPV Vaccine (No Doses Required) Completed   Hepatitis C Screening  Completed   HIV Screening  Completed   Hepatitis B Vaccine  Aged Out   Meningitis B Vaccine  Aged Out  *Topic was postponed. The date shown is not the original due date.    Objective:  BP 135/82   Pulse 63   Temp 98.4 F (36.9 C)   Resp 16    Ht 5' 3 (1.6 m)   Wt 220 lb (99.8 kg)   LMP 02/11/2016   SpO2 95%   BMI 38.97 kg/m    Physical Exam Vitals reviewed.  Constitutional:      Appearance: Normal appearance. She is obese.  HENT:     Head: Normocephalic.     Right Ear: Tympanic membrane, ear canal and external ear normal.     Left Ear: Tympanic membrane, ear canal and external ear normal.     Nose: Nose normal.     Mouth/Throat:     Mouth: Mucous membranes are moist.  Eyes:     Extraocular Movements: Extraocular movements intact.     Pupils: Pupils are equal, round, and reactive to light.  Cardiovascular:     Rate and Rhythm: Normal rate and regular rhythm.  Pulmonary:     Effort: Pulmonary effort is normal.     Breath sounds: Normal breath sounds.  Abdominal:     General: Bowel sounds are normal.     Palpations: Abdomen is soft.  Musculoskeletal:        General: Normal range of motion.     Cervical back: Normal range of motion and neck supple.  Skin:    General: Skin is warm and dry.  Neurological:     Mental Status: She is alert and oriented to person,  place, and time.  Psychiatric:        Mood and Affect: Mood normal.        Behavior: Behavior normal.        Thought Content: Thought content normal.     Assessment & Plan  Diagnoses and all orders for this visit:  Mixed stress and urge urinary incontinence -     Urine Culture -     oxybutynin  (DITROPAN  XL) 10 MG 24 hr tablet; Take 1 tablet (10 mg total) by mouth at bedtime.  Class 2 severe obesity with serious comorbidity and body mass index (BMI) of 39.0 to 39.9 in adult, unspecified obesity type 2/2 Seen in weight management clinic  Hypertension, unspecified type controlled -     lisinopril -hydrochlorothiazide  (ZESTORETIC ) 20-12.5 MG tablet; Take 2 tablets by mouth daily. -     amLODipine  (NORVASC ) 10 MG tablet; Take 1 tablet (10 mg total) by mouth daily.  Medication refill -     lisinopril -hydrochlorothiazide  (ZESTORETIC ) 20-12.5 MG tablet;  Take 2 tablets by mouth daily. -     amLODipine  (NORVASC ) 10 MG tablet; Take 1 tablet (10 mg total) by mouth daily.  Frequent urination -     Urine Culture -     oxybutynin  (DITROPAN  XL) 10 MG 24 hr tablet; Take 1 tablet (10 mg total) by mouth at bedtime.     Patient have been counseled extensively about nutrition and exercise. Other issues discussed during this visit include: low cholesterol diet, weight control and daily exercise, foot care, annual eye examinations at Ophthalmology, importance of adherence with medications and regular follow-up. We also discussed long term complications of uncontrolled diabetes and hypertension.   Return in about 2 months (around 10/09/2024) for medication eval .  The patient was given clear instructions to go to ER or return to medical center if symptoms don't improve, worsen or new problems develop. The patient verbalized understanding. The patient was told to call to get lab results if they haven't heard anything in the next week.   This note has been created with Education officer, environmental. Any transcriptional errors are unintentional.   Carolyn SHAUNNA Bohr, NP 08/11/2024, 11:07 AM     [1] No Known Allergies [2]  Current Outpatient Medications on File Prior to Visit  Medication Sig Dispense Refill   albuterol  (VENTOLIN  HFA) 108 (90 Base) MCG/ACT inhaler Inhale 2 puffs into the lungs every 4 (four) hours as needed for wheezing or shortness of breath. 18 g 1   buPROPion  (WELLBUTRIN  XL) 150 MG 24 hr tablet Take 1 tablet (150 mg total) by mouth every morning. 90 tablet 0   colchicine  0.6 MG tablet Take 1 tablet (0.6 mg total) by mouth daily as needed (gout pain). 15 tablet 0   cyanocobalamin  (VITAMIN B12) 500 MCG tablet Take 1 tablet (500 mcg total) by mouth daily.     Multiple Vitamin (MULTIVITAMIN WITH MINERALS) TABS tablet Take 1 tablet by mouth daily.     naproxen  (NAPROSYN ) 500 MG tablet Take 1 tablet (500 mg  total) by mouth 2 (two) times daily. 30 tablet 0   rosuvastatin  (CRESTOR ) 10 MG tablet Take 1 tablet (10 mg total) by mouth at bedtime. 30 tablet 1   Vitamin D , Ergocalciferol , (DRISDOL ) 1.25 MG (50000 UNIT) CAPS capsule Take 1 capsule (50,000 Units total) by mouth every 7 (seven) days. 12 capsule 0   No current facility-administered medications on file prior to visit.   "

## 2024-08-11 NOTE — Patient Instructions (Signed)
 Oxybutynin  Tablets What is this medication? OXYBUTYNIN  (ox i BYOO ti nin) treats symptoms of an overactive bladder, such as loss of bladder control or frequent need to urinate. It works by relaxing muscles in the bladder. It belongs to a group of medications called antispasmodics. This medicine may be used for other purposes; ask your health care provider or pharmacist if you have questions. COMMON BRAND NAME(S): Ditropan  What should I tell my care team before I take this medication? They need to know if you have any of these conditions: Autonomic neuropathy Dementia Difficulty passing urine Glaucoma Intestinal obstruction Kidney disease Liver disease Myasthenia gravis Parkinson's disease An unusual or allergic reaction to oxybutynin , other medications, foods, dyes, or preservatives Pregnant or trying to get pregnant Breast-feeding How should I use this medication? Take this medication by mouth with a glass of water. Follow the directions on the prescription label. You can take this medication with or without food. Take your medication at regular intervals. Do not take your medication more often than directed. Talk to your care team about the use of this medication in children. Special care may be needed. While this medication may be prescribed for children as young as 5 years for selected conditions, precautions do apply. Overdosage: If you think you have taken too much of this medicine contact a poison control center or emergency room at once. NOTE: This medicine is only for you. Do not share this medicine with others. What if I miss a dose? If you miss a dose, take it as soon as you can. If it is almost time for your next dose, take only that dose. Do not take double or extra doses. What may interact with this medication? Antihistamines for allergy, cough, and cold Atropine Certain medications for bladder problems, such as oxybutynin  or tolterodine Certain medications for Parkinson  disease, such as benztropine or trihexyphenidyl Certain medications for stomach problems, such as dicyclomine or hyoscyamine Certain medications for travel sickness, such as scopolamine Clarithromycin Erythromycin Ipratropium Medications for fungal infections, such as fluconazole, itraconazole, ketoconazole, or voriconazole This list may not describe all possible interactions. Give your health care provider a list of all the medicines, herbs, non-prescription drugs, or dietary supplements you use. Also tell them if you smoke, drink alcohol, or use illegal drugs. Some items may interact with your medicine. What should I watch for while using this medication? Visit your care team for regular checks on your progress. It may take a few weeks to notice the full benefit from this medication. You may need to limit your intake of tea, coffee, caffeinated sodas, and alcohol. These drinks may make your symptoms worse. This medication may affect your coordination, reaction time, or judgment. Do not drive or operate machinery until you know how this medication affects you. Sit up or stand slowly to reduce the risk of dizzy or fainting spells. Drinking alcohol with this medication can increase the risk of these side effects. Your mouth may get dry. Chewing sugarless gum or sucking hard candy and drinking plenty of water may help. Contact your care team if the problem does not go away or is severe. This medication may cause dry eyes and blurred vision. If you wear contact lenses, you may feel some discomfort. Lubricating eye drops may help. See your care team if the problem does not go away or is severe. Avoid extreme heat. This medication can cause you to sweat less than normal. Your body temperature could increase to dangerous levels, which may lead to  heat stroke. What side effects may I notice from receiving this medication? Side effects that you should report to your care team as soon as possible: Allergic  reactions or angioedema--skin rash, itching, hives, swelling of the face, eyes, lips, tongue, arms, or legs, trouble swallowing or breathing Sudden eye pain or change in vision such as blurry vision, seeing halos around lights, vision loss Trouble passing urine Side effects that usually do not require medical attention (report to your care team if they continue or are bothersome): Confusion Constipation Dizziness Drowsiness Dry mouth Headache This list may not describe all possible side effects. Call your doctor for medical advice about side effects. You may report side effects to FDA at 1-800-FDA-1088. Where should I keep my medication? Keep out of the reach of children. Store at room temperature between 15 and 30 degrees C (59 and 86 degrees F). Protect from moisture and humidity. Throw away any unused medication after the expiration date. NOTE: This sheet is a summary. It may not cover all possible information. If you have questions about this medicine, talk to your doctor, pharmacist, or health care provider.  2024 Elsevier/Gold Standard (2022-01-16 00:00:00)

## 2024-08-14 LAB — URINE CULTURE

## 2024-08-15 ENCOUNTER — Ambulatory Visit (INDEPENDENT_AMBULATORY_CARE_PROVIDER_SITE_OTHER): Payer: Self-pay | Admitting: Primary Care

## 2024-10-06 ENCOUNTER — Ambulatory Visit (INDEPENDENT_AMBULATORY_CARE_PROVIDER_SITE_OTHER): Payer: Self-pay | Admitting: Primary Care
